# Patient Record
Sex: Male | Born: 1950 | Race: White | Hispanic: No | Marital: Married | Smoking: Former smoker
Health system: Southern US, Community
[De-identification: ages and names within clinical notes are randomized; demographics above are authoritative.]

## PROBLEM LIST (undated history)

## (undated) DIAGNOSIS — Z86010 Personal history of colonic polyps: Secondary | ICD-10-CM

## (undated) DIAGNOSIS — E785 Hyperlipidemia, unspecified: Secondary | ICD-10-CM

## (undated) DIAGNOSIS — J449 Chronic obstructive pulmonary disease, unspecified: Secondary | ICD-10-CM

## (undated) DIAGNOSIS — Z720 Tobacco use: Secondary | ICD-10-CM

## (undated) DIAGNOSIS — R918 Other nonspecific abnormal finding of lung field: Secondary | ICD-10-CM

## (undated) DIAGNOSIS — E119 Type 2 diabetes mellitus without complications: Secondary | ICD-10-CM

## (undated) DIAGNOSIS — J9811 Atelectasis: Secondary | ICD-10-CM

## (undated) DIAGNOSIS — I10 Essential (primary) hypertension: Secondary | ICD-10-CM

## (undated) DIAGNOSIS — G4730 Sleep apnea, unspecified: Secondary | ICD-10-CM

## (undated) DIAGNOSIS — Z136 Encounter for screening for cardiovascular disorders: Secondary | ICD-10-CM

## (undated) DIAGNOSIS — Z9889 Other specified postprocedural states: Secondary | ICD-10-CM

## (undated) DIAGNOSIS — K219 Gastro-esophageal reflux disease without esophagitis: Secondary | ICD-10-CM

## (undated) DIAGNOSIS — M1990 Unspecified osteoarthritis, unspecified site: Secondary | ICD-10-CM

## (undated) HISTORY — DX: Gastro-esophageal reflux disease without esophagitis: K21.9

## (undated) HISTORY — PX: FRACTURE SURGERY: SHX138

## (undated) HISTORY — DX: Hyperlipidemia, unspecified: E78.5

## (undated) HISTORY — DX: Other nonspecific abnormal finding of lung field: R91.8

## (undated) HISTORY — DX: Encounter for screening for cardiovascular disorders: Z13.6

## (undated) HISTORY — PX: LUNG SURGERY: SHX703

## (undated) HISTORY — DX: Personal history of colonic polyps: Z86.010

## (undated) HISTORY — DX: Atelectasis: J98.11

## (undated) HISTORY — PX: ROTATOR CUFF REPAIR: SHX139

## (undated) HISTORY — PX: TONSILLECTOMY: SUR1361

## (undated) HISTORY — DX: Chronic obstructive pulmonary disease, unspecified: J44.9

## (undated) HISTORY — DX: Tobacco use: Z72.0

## (undated) HISTORY — DX: Essential (primary) hypertension: I10

## (undated) HISTORY — DX: Other specified postprocedural states: Z98.890

---

## 1988-04-27 HISTORY — PX: NASAL SINUS SURGERY: SHX719

## 1998-04-26 ENCOUNTER — Encounter: Payer: Self-pay | Admitting: Emergency Medicine

## 1998-04-26 ENCOUNTER — Emergency Department (HOSPITAL_COMMUNITY): Admission: EM | Admit: 1998-04-26 | Discharge: 1998-04-26 | Payer: Self-pay | Admitting: Emergency Medicine

## 1998-08-29 ENCOUNTER — Ambulatory Visit (HOSPITAL_BASED_OUTPATIENT_CLINIC_OR_DEPARTMENT_OTHER): Admission: RE | Admit: 1998-08-29 | Discharge: 1998-08-29 | Payer: Self-pay | Admitting: Orthopedic Surgery

## 1998-11-13 ENCOUNTER — Ambulatory Visit (HOSPITAL_COMMUNITY): Admission: RE | Admit: 1998-11-13 | Discharge: 1998-11-13 | Payer: Self-pay | Admitting: Orthopedic Surgery

## 1998-11-13 ENCOUNTER — Encounter: Payer: Self-pay | Admitting: Orthopedic Surgery

## 1999-03-12 ENCOUNTER — Ambulatory Visit (HOSPITAL_COMMUNITY): Admission: RE | Admit: 1999-03-12 | Discharge: 1999-03-12 | Payer: Self-pay | Admitting: Orthopedic Surgery

## 1999-03-12 ENCOUNTER — Encounter: Payer: Self-pay | Admitting: Orthopedic Surgery

## 1999-06-17 ENCOUNTER — Encounter: Payer: Self-pay | Admitting: Orthopedic Surgery

## 1999-06-17 ENCOUNTER — Ambulatory Visit (HOSPITAL_COMMUNITY): Admission: RE | Admit: 1999-06-17 | Discharge: 1999-06-17 | Payer: Self-pay | Admitting: Orthopedic Surgery

## 2000-06-21 ENCOUNTER — Ambulatory Visit (HOSPITAL_COMMUNITY): Admission: RE | Admit: 2000-06-21 | Discharge: 2000-06-21 | Payer: Self-pay | Admitting: Orthopedic Surgery

## 2000-06-21 ENCOUNTER — Encounter: Payer: Self-pay | Admitting: Orthopedic Surgery

## 2000-07-06 ENCOUNTER — Ambulatory Visit (HOSPITAL_BASED_OUTPATIENT_CLINIC_OR_DEPARTMENT_OTHER): Admission: RE | Admit: 2000-07-06 | Discharge: 2000-07-06 | Payer: Self-pay | Admitting: Orthopedic Surgery

## 2000-07-06 HISTORY — PX: OTHER SURGICAL HISTORY: SHX169

## 2001-07-01 ENCOUNTER — Ambulatory Visit (HOSPITAL_BASED_OUTPATIENT_CLINIC_OR_DEPARTMENT_OTHER): Admission: RE | Admit: 2001-07-01 | Discharge: 2001-07-01 | Payer: Self-pay | Admitting: *Deleted

## 2001-08-04 ENCOUNTER — Encounter: Payer: Self-pay | Admitting: Gastroenterology

## 2001-08-04 ENCOUNTER — Ambulatory Visit (HOSPITAL_COMMUNITY): Admission: RE | Admit: 2001-08-04 | Discharge: 2001-08-04 | Payer: Self-pay | Admitting: Gastroenterology

## 2001-08-05 ENCOUNTER — Encounter: Payer: Self-pay | Admitting: Gastroenterology

## 2001-08-05 ENCOUNTER — Ambulatory Visit (HOSPITAL_COMMUNITY): Admission: RE | Admit: 2001-08-05 | Discharge: 2001-08-05 | Payer: Self-pay | Admitting: Gastroenterology

## 2001-08-08 ENCOUNTER — Encounter: Payer: Self-pay | Admitting: Gastroenterology

## 2001-08-08 ENCOUNTER — Ambulatory Visit (HOSPITAL_COMMUNITY): Admission: RE | Admit: 2001-08-08 | Discharge: 2001-08-08 | Payer: Self-pay | Admitting: Gastroenterology

## 2002-07-05 ENCOUNTER — Encounter: Payer: Self-pay | Admitting: Emergency Medicine

## 2002-07-05 ENCOUNTER — Inpatient Hospital Stay (HOSPITAL_COMMUNITY): Admission: EM | Admit: 2002-07-05 | Discharge: 2002-07-06 | Payer: Self-pay | Admitting: Emergency Medicine

## 2002-07-05 HISTORY — PX: CARDIAC CATHETERIZATION: SHX172

## 2002-07-06 ENCOUNTER — Encounter: Payer: Self-pay | Admitting: *Deleted

## 2005-11-11 ENCOUNTER — Ambulatory Visit (HOSPITAL_COMMUNITY): Admission: RE | Admit: 2005-11-11 | Discharge: 2005-11-12 | Payer: Self-pay | Admitting: Orthopedic Surgery

## 2005-11-11 HISTORY — PX: OTHER SURGICAL HISTORY: SHX169

## 2006-11-18 ENCOUNTER — Ambulatory Visit: Payer: Self-pay | Admitting: Pulmonary Disease

## 2006-11-22 ENCOUNTER — Ambulatory Visit: Admission: RE | Admit: 2006-11-22 | Discharge: 2006-11-22 | Payer: Self-pay | Admitting: Pulmonary Disease

## 2006-11-22 ENCOUNTER — Ambulatory Visit: Payer: Self-pay | Admitting: Pulmonary Disease

## 2006-11-22 ENCOUNTER — Encounter: Payer: Self-pay | Admitting: Pulmonary Disease

## 2006-11-22 HISTORY — PX: OTHER SURGICAL HISTORY: SHX169

## 2006-12-07 ENCOUNTER — Ambulatory Visit: Payer: Self-pay | Admitting: Pulmonary Disease

## 2007-01-10 ENCOUNTER — Ambulatory Visit: Payer: Self-pay | Admitting: Gastroenterology

## 2007-01-25 ENCOUNTER — Ambulatory Visit: Payer: Self-pay | Admitting: Pulmonary Disease

## 2007-07-12 DIAGNOSIS — J309 Allergic rhinitis, unspecified: Secondary | ICD-10-CM | POA: Insufficient documentation

## 2007-07-12 DIAGNOSIS — J45909 Unspecified asthma, uncomplicated: Secondary | ICD-10-CM | POA: Insufficient documentation

## 2007-07-12 DIAGNOSIS — I10 Essential (primary) hypertension: Secondary | ICD-10-CM | POA: Insufficient documentation

## 2007-07-12 DIAGNOSIS — K2980 Duodenitis without bleeding: Secondary | ICD-10-CM | POA: Insufficient documentation

## 2007-07-12 DIAGNOSIS — E7800 Pure hypercholesterolemia, unspecified: Secondary | ICD-10-CM | POA: Insufficient documentation

## 2008-06-22 HISTORY — PX: DOPPLER ECHOCARDIOGRAPHY: SHX263

## 2008-08-06 ENCOUNTER — Ambulatory Visit: Payer: Self-pay | Admitting: Pulmonary Disease

## 2008-08-06 DIAGNOSIS — F17200 Nicotine dependence, unspecified, uncomplicated: Secondary | ICD-10-CM

## 2008-08-08 DIAGNOSIS — G4733 Obstructive sleep apnea (adult) (pediatric): Secondary | ICD-10-CM | POA: Insufficient documentation

## 2008-09-04 ENCOUNTER — Ambulatory Visit: Payer: Self-pay | Admitting: Pulmonary Disease

## 2009-03-07 ENCOUNTER — Ambulatory Visit: Payer: Self-pay | Admitting: Gastroenterology

## 2009-03-07 DIAGNOSIS — K625 Hemorrhage of anus and rectum: Secondary | ICD-10-CM

## 2009-03-07 DIAGNOSIS — K589 Irritable bowel syndrome without diarrhea: Secondary | ICD-10-CM

## 2009-03-13 ENCOUNTER — Encounter (INDEPENDENT_AMBULATORY_CARE_PROVIDER_SITE_OTHER): Payer: Self-pay | Admitting: *Deleted

## 2009-03-14 ENCOUNTER — Ambulatory Visit: Payer: Self-pay | Admitting: Gastroenterology

## 2009-03-14 DIAGNOSIS — Z860100 Personal history of colon polyps, unspecified: Secondary | ICD-10-CM

## 2009-03-14 HISTORY — DX: Personal history of colon polyps, unspecified: Z86.0100

## 2009-03-14 HISTORY — DX: Personal history of colonic polyps: Z86.010

## 2009-03-27 ENCOUNTER — Encounter: Payer: Self-pay | Admitting: Gastroenterology

## 2010-05-14 ENCOUNTER — Encounter
Admission: RE | Admit: 2010-05-14 | Discharge: 2010-05-14 | Payer: Self-pay | Source: Home / Self Care | Attending: Family Medicine | Admitting: Family Medicine

## 2010-09-09 HISTORY — PX: OTHER SURGICAL HISTORY: SHX169

## 2010-09-09 NOTE — Assessment & Plan Note (Signed)
[facility]                             PULMONARY OFFICE NOTE   Vincent Short, Vincent Short                    MRN:          [id number]  DATE:11/18/2006                            DOB:          [birth year]    REFERRING PHYSICIAN:  Kerry Kass, M.D. [facility]   I met with Vincent Short for evaluation of his abnormal x-ray.   He says that approximately 2 weeks ago he was working in his yard, and  he believes that he had some exposure to some mold from hay next to his  yard.  He developed acute onset of shortness of breath with chest pain,  wheeze, cough, and nasal congestion.  He did not have any fevers or  chills.  He denies any abdominal pain or nausea. He has not had any  diarrhea.  There is no skin rash or leg swelling.  He says that his  symptoms have improved since then, although he has felt that he has some  congestion in his chest.  He does have a history of allergies and is on  allergy shots.  He says he is allergic to mold, grass, and pollen.  He  has been feeling short of breath since this episode, but this appears to  be improving somewhat.  He had a chest x-ray done recently which showed  atelectasis in the right upper lobe which is confirmed with CT scan of  the chest.  It appears that he had soft tissue narrowing of the right  upper lobe bronchus but no definite mass, and there was no significant  lymphadenopathy.   PAST MEDICAL HISTORY:  Significant for:  1. Hypertension.  2. Elevated cholesterol.  3. Allergies.  4. Sleep apnea.   PAST SURGICAL HISTORY:  Significant for:  1. Tonsillectomy.  2. Left wrist and left ankle fractures.  3. Sinus surgery in 1990.   CURRENT MEDICATIONS:  1. Metoprolol 25 mg daily.  2. Avapro 300 mg daily.  3. Caduet 10 mg daily.  4. Astelin as needed.  5. Nasacort as needed.  6. Zyrtec as needed.   ALLERGIES:  IV DYE.   SOCIAL HISTORY:  He is married.  He works in Psychologist, sport and exercise.  He  smoked 1 pack of cigarettes a day for the last 35  years.  He has occasional alcohol use.   FAMILY HISTORY:  Significant for his parents who have allergies and  heart disease.   REVIEW OF SYSTEMS:  Unremarkable except as stated above.   PHYSICAL EXAMINATION:  VITAL SIGNS:  He is 6 feet tall, 225 pounds.  Temperature is 98.4, blood pressure 110/78, heart rate 75, oxygen  saturation 94% on room air.  HEENT:  Pupils reactive, extraocular movements intact.  There is no  sinus tenderness, no nasal discharge.  NECK:  He has Mallampati III airway.  There is no lymphadenopathy, no  thyromegaly.  HEART:  S1, S2.  Regular rate and rhythm.  CHEST:  There were no wheezing or rales, but there were diminished  breath sounds.  ABDOMEN:  Obese, soft, nontender.  Positive bowel sounds.  EXTREMITIES:  No edema, cyanosis, clubbing.Marland Kitchen  NEUROLOGIC:  No focal deficits were appreciated.   Chest x-ray in my office today showed right upper lobe opacity with  atelectasis.   IMPRESSION:  1. Right upper lobe atelectasis with history of allergies and possibly      asthma.  He does also have a history of significant tobacco abuse.      To further assess this lesion,  I have scheduled him for a      bronchoscopy which will be done on July 28 at [facility]      at 11:30 a.m.  I have discussed the procedure with him.  I reviewed      the possible risks including bleeding, infection, pneumothorax, and      no diagnosis.  I have advised the patient that he should not eat or      drink anything after midnight prior to the study and that he would      need to have somebody who can drive him to and from the hospital.  2. Tobacco abuse.  I have prescribed him Chantix.  I discussed with      him the dosing regimen for this as well as the scheduled quit date      for his cigarettes and also advised him of the possible adverse      side effects related to Chantix.   I will follow up with him in approximately  1 week to discuss the results  of his biopsy and also to assess his smoking status.     Coralyn Helling, MD  Electronically Signed    VS/MedQ  DD: 11/23/2006  DT: 11/24/2006  Job #: [id number]   cc:   Kerry Kass, M.D. [facility]

## 2010-09-09 NOTE — Assessment & Plan Note (Signed)
[facility]                         GASTROENTEROLOGY OFFICE NOTE   Vincent Short, Vincent Short                    MRN:          [id number]  DATE:01/10/2007                            DOB:          12-27-1950    REASON FOR CONSULTATION:  Change in bowel habits and rectal bleeding.   Vincent Short is a pleasant 60 year old white male here for evaluation of  erratic bowels.  He has had several weeks of frequent loose stools.  It  is accompanied by urgency and severe crampy lower abdominal pain.  He  has had several episodes of bright red blood mixed with his stools.  The  symptoms are coincidental with him starting Chantix for smoking  cessation.  He has a history of what sounds like IBS with probably  sensitive stomach where he will develop loose stools.  There has been no  change in his food intake recently.  Last colonoscopy was in 2003, which  was normal.   PAST MEDICAL HISTORY:  1. Hypertension.  2. He has sleep apnea.  3. He has undergone ankle, wrist, and sinus surgery.  4. Tonsillectomy.   FAMILY HISTORY:  Noncontributory.   MEDICATIONS:  Metoprolol, Avapro, Caduet, Astelin, Nasacort, and  nicotine patch.   ALLERGIES:  IVP DYE.   He is a recent ex-smoker.  He does not drink.  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   REVIEW OF SYSTEMS:  Positive for joint pains and fatigue.   PHYSICAL EXAMINATION:  VITAL SIGNS:  Pulse 92, blood pressure 136/70,  weight 227.   PHYSICAL EXAMINATION:  HEENT: EOMI.  PERRLA.  Sclerae are anicteric.  Conjunctivae are pink.  NECK:  Supple without thyromegaly, adenopathy or carotid bruits.  CHEST:  Clear to auscultation and percussion without adventitious  sounds.  CARDIAC:  Regular rhythm; normal S1 S2.  There are no murmurs, gallops  or rubs.  ABDOMEN:  Bowel sounds are normoactive.  Abdomen is soft, nontender and  nondistended.  There are no abdominal masses, tenderness, splenic  enlargement or  hepatomegaly.  EXTREMITIES:  Full range of motion.  No cyanosis, clubbing or edema.  RECTAL:  Deferred.   IMPRESSION:  1. Change in bowel habits characterized by abdominal pain and      diarrhea.  This certainly could be related to his Chantix.      Obstructive lesion of the colon ought to be ruled out.  2. Rectal bleeding, probably secondary to number one.   RECOMMENDATION:  1. Hold Chantix.  2. Colonoscopy.     Barbette Hair. Arlyce Dice, MD,FACG  Electronically Signed    RDK/MedQ  DD: 01/10/2007  DT: 01/10/2007  Job #: [id number]

## 2010-09-09 NOTE — Assessment & Plan Note (Signed)
[facility]                             PULMONARY OFFICE NOTE   NAME:CHRISMONNeftali, Short                    MRN:          [id number]  DATE:12/07/2006                            DOB:          28-Aug-1950    I saw Mr. Vincent Short with his wife today in followup for his right upper  lobe atelectasis and tobacco abuse.   He says that his symptoms have improved considerably since his last  visit.  He also says that he will start taking his Chantix August 13,  although he still continues to smoke about a pack of cigarettes a day.   His medication list was reviewed.   PHYSICAL EXAMINATION:  He is 227 pounds, temperature is 97.5, blood  pressure is 118/78, heart rate is 77, oxygen saturation is 95% on room  air.  HEENT:  No sinus tenderness, no oral lesions, no oral lymphadenopathy.  HEART:  S1, S2.  CHEST:  There was no wheezing or rales.  ABDOMEN:  Soft, nontender.  EXTREMITIES:  No edema.   Chest x-ray done earlier today showed complete resolution of the right  upper lobe atelectasis with no other acute findings.   IMPRESSION:  1. Right upper lobe atelectasis which has essentially resolved, and I      suspect this was on the basis of possible underlying asthma with      acute bronchitic flare.  For this I will give him a trial of      Proventil HFA inhaler 2 puffs q.i.d. p.r.n., although I suspect      that much of his symptoms with this will likely improve      significantly once he discontinues smoking.  2. Tobacco abuse.  I have encouraged him to initiate his program of      smoking cessation with Chantix.  3. Allergic rhinitis.  He is due to follow with Dr. Corinda Gubler.   I will follow up with him in approximately 6 weeks.     Coralyn Helling, MD  Electronically Signed    VS/MedQ  DD: 12/07/2006  DT: 12/08/2006  Job #: [id number]   cc:   Kerry Kass, M.D. [facility]

## 2010-09-09 NOTE — Assessment & Plan Note (Signed)
HEALTHCARE                             PULMONARY OFFICE NOTE   NAME:CHRISMONTyan, Dy                    MRN:          [id number]  DATE:01/25/2007                            DOB:          03/19/1951    SUBJECTIVE:  I saw Mr. Duvall today in followup for his atelectasis,  asthma, tobacco abuse and allergic rhinitis.   He says that about one or two weeks after being started on Chantix, he  started developing bloating and diarrhea.  He has since been seen by Dr.  Barbette Hair. Arlyce Dice and is scheduled to undergo a colonoscopy.  He said  that after stopping the Chantix, his symptoms have improved, although he  is still having some problems.  He continues to smoke four or five  cigarettes a day.  He says that his breathing is better, but he still  occasionally needs to use his Proventil inhaler.   CURRENT MEDICATIONS:  1. Metoprolol 25 mg daily.  2. Avapro 300 mg daily.  3. Astelin two sprays twice daily.  4. Nasacort as needed.  5. Caduet 10/40 mg daily.  6. He said he also tried a Nicotine patch, but did not notice any      difference with this.   PHYSICAL EXAMINATION:  VITAL SIGNS:  Weight 231 pounds, temperature 97  degrees, blood pressure 120/82, heart rate 76, oxygen saturation 96% on  room air.  HEENT:  No sinusitis, no lesions.  HEART:  S1 and S2.  CHEST:  No wheezing.  ABDOMEN:  Obese, soft, nontender.  EXTREMITIES:  No edema.   IMPRESSION/PLAN:  1. Abnormal chest x-ray with atelectasis:  I will have him undergo a      repeat chest x-ray in my office today, to make sure that this is      cleared up completely.  2. Tobacco abuse:  He is to have his colonoscopy with Dr. Melvia Heaps.  Depending upon the results of this, it may be beneficial      to try him on Chantix again, since he did seem to have a good      response with regards to his smoking cessation, but this will      depend upon whether he will be able to tolerate it, due  to his      possible gastrointestinal side effects.  3. Asthma and allergic rhinitis:  He is to follow up with Dr. Kerry Kass for this.   I will call him with the results of his chest x-ray; however, if this is  unremarkable, I have advised him that he could follow up with Dr. Stevphen Rochester for further treatment of his asthma and allergies.  In addition,  I have advised him to make arrangements to establish care with a primary  care physician who will then be able to continue treatment of his  tobacco use.  Again, if his  chest x-ray is unremarkable, then I have advised him that he can follow  up with me on an as-needed basis.  Coralyn Helling, MD  Electronically Signed    VS/MedQ  DD: 01/25/2007  DT: 01/25/2007  Job #: [id number]   cc:   Kerry Kass, M.D. [facility]

## 2010-09-09 NOTE — Op Note (Signed)
NAMEDEVONN, Vincent Short             ACCOUNT NO.:  [id number]   MEDICAL RECORD NO.:  [id number]          PATIENT TYPE:  AMB   LOCATION:  CARD                         FACILITY:  [facility]   PHYSICIAN:  Coralyn Helling, MD        DATE OF BIRTH:  1950/09/16   DATE OF PROCEDURE:  11/22/2006  DATE OF DISCHARGE:                               OPERATIVE REPORT   PROCEDURE:  Bronchoscopy with airway inspection, brush cytology,  transbronchial biopsy and fine-needle aspiration.   PREOPERATIVE DIAGNOSES:  1. Atelectasis.  2. Rule out lung cancer.   POSTOPERATIVE DIAGNOSES:  1. Atelectasis.  2. Rule out lung cancer.   ATTENDING PHYSICIAN:  Coralyn Helling, MD   PROCEDURE:  Vincent Short is a 60 year old male who has a history of  allergies and tobacco use.  He developed symptoms of an upper airway  infection and chest x-ray at that time showed atelectasis of the right  upper lobe; this was confirmed on CT scan of his chest and he is  scheduled to undergo bronchoscopy for airway inspection to determine if  there is an obstructing lesion in the right upper lobe.  The procedure  was explained to the patient.  Risks were detailed including possible  infection, bleeding, pneumothorax and non-diagnosis.  The patient signed  the consent form.  He was given a total of 200 mcg of fentanyl and 10 mg  of Versed for sedation and analgesia.  His right naris was sprayed with  viscous lidocaine and his posterior pharynx was sprayed with Cetacaine  spray.  The bronchoscope was entered to the right naris.  The vocal  cords were visualized and appeared to have normal motion.  Lidocaine was  instilled as needed for topical anesthesia.  The bronchoscope was then  entered into the trachea.  The carina was visualized and appeared  normal.  The bronchoscope was then entered to the left main bronchus.  The left upper lingular and lower lobe orifices were all visualized and  there were no obvious endobronchial lesions.  The  bronchoscope was then  entered to the right main bronchus.  The right middle and lower lobe  orifices were visualized without any obvious endobronchial lesions.  The  focus was then shifted to the right upper lobe.  There was a  considerable amount of thick yellowish secretions obstructing the right  upper lobe orifice.  Saline and Mucomyst were instilled to loosen the  secretions up, which were vigorously suctioned and the majority of the  secretions were removed.  The airways appeared to be more patent after  this, although there still appeared to be some degree of inflammation  and narrowing of the right upper lobe orifice.  Then using fluoroscopic  guidance, transbronchial biopsies were obtained from the right upper  lobe in all 3 segments.  Following this, brush cytology was obtained  from the right upper lobe.  Then following this, fine-needle aspiration  was done from the right upper lobe.  There was a minimal amount of  bleeding which resolved after instillation of saline.  There was no  other obvious immediate complication and  the patient was returned to  recovery room in stable condition.  The specimens will be sent for  cytology and surgical pathology and microbiology.  A chest x-ray is  pending at this time.  I will follow up the patient in the next 2-3 days  to discuss the results of his biopsy as well as to determine if any  further interventions will be necessary.      Coralyn Helling, MD  Electronically Signed     VS/MEDQ  D:  11/22/2006  T:  11/23/2006  Job:  [id number]

## 2010-09-12 NOTE — Discharge Summary (Signed)
NAME:  Vincent Short, Vincent Short                       ACCOUNT NO.:  [id number]   MEDICAL RECORD NO.:  [id number]                   PATIENT TYPE:  INP   LOCATION:  3737                                 FACILITY:  [facility]   PHYSICIAN:  Kem Boroughs, M.D.                 DATE OF BIRTH:  [birth year]   DATE OF ADMISSION:  07/05/2002  DATE OF DISCHARGE:  07/06/2002                                 DISCHARGE SUMMARY   DISCHARGE DIAGNOSES:  1. Chest pain consistent with unstable angina.  2. Smoking.  3. Hyperlipidemia.  4. Hypertension.  5. Family history of coronary disease.   HOSPITAL COURSE:  The patient is a 60 year old male followed at [facility], with no prior history of coronary disease.  He presented to  the emergency room with complaints of chest pressure and dyspnea on  exertion, consistent with unstable angina.  At the time he had his symptoms,  he was working with a chain saw and had to stop because of severe dyspnea  and left chest pain.  He was pain-free by the time he arrived to the  emergency room.  He admitted to dyspnea on exertion over the last year or  so.  He has multiple risk factors for coronary disease as noted above  including hypertension, hyperlipidemia and smoking and family history.  The  patient was admitted to telemetry.  CK, MB's and troponins were obtained.  These were negative.  His EKG was normal.  We proceeded with elective  catheterization which was done 07/05/02.  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  Otherwise, he had no significant stenosis.  He had normal LV  function, normal renal arteries and normal iliac arteries.  He tolerated  procedure well.  Plan is for smoking cessation, aggressive lipid management  and control of his hypertension.   LABORATORY DATA:  Sodium 140, potassium 4.4, BUN 7, creatinine 0.8.  LFT's  are normal.  White count 11, hemoglobin 16,  hematocrit 46, platelets 213.  INR 0.9, CK-MB and troponins negative.  Chest x-ray is pending at the time  of the dictation.  EKG shows sinus rhythm without acute changes.   DISCHARGE MEDICATIONS:  1. Toprol XL 25 mg a day.  2. Avapro 150 daily.  3.     Aspirin 81 mg a day.  4. Wellbutrin SR 150 1 daily for three days and then 1 p.o. b.i.d.   DISPOSITION:  The patient is discharged in stable condition.  He will  followup with Dr. Domingo Sep in [place].      Abelino Derrick, P.A.                      Kem Boroughs, M.D.    Lenard Lance  D:  07/06/2002  T:  07/06/2002  Job:  [id number]   cc:   Kem Boroughs,  M.D.  [address], [employer]  [place]  [place] [address]  Fax: [phone]   Olena Leatherwood [facility]

## 2010-09-12 NOTE — Discharge Summary (Signed)
   NAME:  Short, Vincent                       ACCOUNT NO.:  [id number]   MEDICAL RECORD NO.:  [id number]                   PATIENT TYPE:  INP   LOCATION:  3737                                 FACILITY:  [facility]   PHYSICIAN:  Darlin Priestly, M.D.             DATE OF BIRTH:  10-Jun-1950   DATE OF ADMISSION:  07/05/2002  DATE OF DISCHARGE:                                 DISCHARGE SUMMARY   ADDENDUM:  The patient was seen and evaluated by Dr. Pearletha Furl. Alanda Amass on  July 06, 2002.  It was felt that the elevated white count may be secondary  to some mild sinusitis.  The patient does show some symptoms of sinusitis.  It was decided to add Nasacort, but as it turns out, the patient already has  Flonase at home, so we will continue Flonase.  As well, we were going to add  Zyrtec but he has a Allegra at home and is going to continue Allegra.  In  addition, we have given him a Z-Pak to take 500 mg today, then 250 mg once a  day for five days.  As well, we have discussed smoking cessation.  As well,  the chest x-ray has been reviewed and showed some mild cardiomegaly and mild  atelectasis.  No other significant findings.   Also please note that the patient says that his parents see Dr. Darlin Priestly and highly recommend Dr. Jenne Campus.  Because of this, he wants to  follow up with Dr. Jenne Campus in [place] rather than going to Dr. Domingo Sep  in [place].  Therefore, addendums have been made to his medicine list as  above.  As well, I have scheduled him an appointment to see Dr. Jenne Campus,  April 6th, at 3:45 p.m.     Mary B. Easley, P.A.-C.                   Darlin Priestly, M.D.    MBE/MEDQ  D:  07/06/2002  T:  07/07/2002  Job:  [id number]

## 2010-09-12 NOTE — Op Note (Signed)
NAMETORRION, WITTER             ACCOUNT NO.:  [id number]   MEDICAL RECORD NO.:  [id number]          PATIENT TYPE:  OIB   LOCATION:  2550                         FACILITY:  [facility]   PHYSICIAN:  Robert A. Thurston Hole, M.D. DATE OF BIRTH:  [birth year]   DATE OF PROCEDURE:  11/11/2005  DATE OF DISCHARGE:                                 OPERATIVE REPORT   PREOPERATIVE DIAGNOSES:  1. Left ankle ligamentous instability.  2. Left ankle loose bodies with synovitis.  3. Left ankle chondromalacia.   POSTOPERATIVE DIAGNOSES:  1. Left ankle ligamentous instability.  2. Left ankle loose bodies with synovitis.  3. Left ankle chondromalacia.   PROCEDURES:  1. Left ankle examination under anesthesia, followed by arthroscopic      debridement with loose body excision and synovectomy and chondroplasty.  2. Left ankle modified Brostrom ligamentous reconstruction.   SURGEON:  Elana Alm. Thurston Hole, M.D.   ASSISTANT:  Julien Girt, P.A.   ANESTHESIA:  General.   OPERATIVE TIME:  One hour and 20 minutes.   COMPLICATIONS:  None.   INDICATIONS:  Vincent Short is a 60 year old gentleman who injured his left  ankle on Sep 06, 2005, with an inversion injury.  Significant pain with  significant instability noted on x-rays including stress x-rays with a large  degree of instability of the ankle.  Due to this and loose bodies noted on  the x-rays he is now to undergo arthroscopy, debridement and ligament  reconstruction.   DESCRIPTION OF PROCEDURE:  Vincent Short was brought to the operating room on  October 12, 2005, placed on the operating table in the supine position after a  popliteal nerve block was placed in the holding room by anesthesia.  After  being placed under MAC anesthesia, his left ankle was examined.  He had full  range of motion with 2+ anterior drawer and 3+ talar tilt.  His left foot  and leg were prepped and draped using sterile DuraPrep and draped using  sterile technique.  He received  Ancef 1 g IV perioperatvely for prophylaxis.  Foot and leg was exsanguinated and a calf tourniquet elevated to 300.  Initially the arthroscopy was performed through an anteromedial portal  incising only skin to prevent any damage to the dorsal cutaneous nerves.  The arthroscope with a pump attached was placed and through an anterolateral  portal using the same careful technique, arthroscopic probe was placed.  On  initial inspection the articular cartilage on the talar dome and tibial  plafond centrally was found to be intact.  There was significant synovitis  laterally and this was debrided.  The lateral gutter showed significant  synovitis and some loose bodies in this area and this was debrided.  The  lateral malleolus and lateral talar dome articular cartilage showed grade 1  and 2 chondromalacia.  Medially significant synovitis was debrided. The  medial gutter showed synovitis as well which was debrided.  There was found  to be 30% grade 4 and the rest grade 3 chondromalacia on the medial  malleolus and medial talar dome articulation.  This was debrided.  There was  also  found to be a spur medially and this was resected with a 4 mm bur.  After this was done, no further pathology noted from the arthroscopic point  of view but the ligament reconstruction was then carried out.  The  arthroscopic instruments were removed.  A 5 to 6 cm posterolateral incision  was then made over the distal fibula, underlying subcutaneous tissues were  incised along the skin incision.  Sural nerve carefully protected while the  an anterior lateral oblique incision was made over the anterior talofibular  ligament capsular complex.  Found to have significant laxity and redundancy  and tearing of this tissue and all of the torn tissue was debrided back to  more healthy appearing tissue.  Three separate mattress 2-0 FiberWire  sutures were placed in the ATF capsular complex to be tied later.  At this  point the  peroneus brevis was exposed proximal to the fibula through the  peroneal tendon sheath.  The peroneus brevis was split longitudinally and  harvested down distally to just proximal to the base of the 5th metatarsal  keeping it still attached there.  One-half of this peroneus brevis was  passed through the anterolateral soft tissues and then a drill hole placed  in the distal fibula from anterior to posterior and then the tendon passed  through this fibular tunnel from anterior to posterior and then back  subperiosteally on itself.  The ankle was then placed in a neutral and  slight eversion position and each of the ATF capsular sutures were  sequentially tied.  This significantly reduced the instability by about 80%.  With the ankle remaining in this position, then the peroneus brevis graft  was tied down to itself over the bone tunnel using multiple 2-0 FiberWire  sutures as well.  After this was done the instability was found to be  completely eliminated.  At this point the wound was copiously irrigated.  The fascia over the ATF capsular sutures were closed with 0 Vicryl.  The  peroneal tendon sheath was closed with 2-0 Vicryl.  Subcutaneous tissues  closed with 2-0 Vicryl, skin closed with skin staples.  Sterile dressings  were applied and a short leg splint applied.  The tourniquet was released,  and then the patient awakened and taken to the recovery room in stable  condition.  Needle and sponge counts were correct times 2 at the end of the  case.   FOLLOWUP:  Vincent Short will be followed overnight for close observation due  to his significant sleep apnea.  He will be discharged tomorrow if stable.  He will be seen back in the office in a week for wound check and followup.      Robert A. Thurston Hole, M.D.  Electronically Signed     RAW/MEDQ  D:  11/11/2005  T:  11/12/2005  Job:  [id number]   cc:   Worker's comp carrier

## 2010-09-12 NOTE — Op Note (Signed)
[place]  Patient:    Vincent Short, Vincent Short                    MRN: [id number] Proc. Date: 07/06/00 Adm. 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  OPERATION:  Arthroscopy left wrist with radial styloidectomy.  SURGEON:  Nicki Reaper, M.D.  ASSISTANT:  Joaquin Courts, R.N.  ANESTHESIA:  Axillary block.  ANESTHESIOLOGIST:  Janetta Hora. Gelene Mink, M.D.  HISTORY:  The patient is a 60 year old male with a history of a scaphoid nonunion.  He has an avascular proximal pole, has undergone a vascularized bone graft, has questionable healing.  He has early changes for arthritis on the radial styloid.  DESCRIPTION OF PROCEDURE:  The patient was brought to the operating room, where an axillary block was carried out without difficulty.  He was prepped and draped using Betadine scrub and solution with the left arm free.  The limb was placed in the arthroscopy tower and 10 pounds of traction applied.  The joint inflated 3-4 portal.  A six U portal was used for outflow.  The scope was introduced after making a small transverse incision, deepened with a hemostat.  A blunt trocar was used to enter the joint.  The joint was inspected.  The cartilage was intact over the proximal aspect of the scaphoid and the area of the fracture was apparent.  There was no gross movement to the area.  Area of chondromalacia was present on the radial styloid tip.  The ulnar side of the ligaments were intact.  Triangular fibrocartilage was intact.  Four-5 portal was opened along with a two portal.  The scope was then introduced in 4-5, brought to the ulnar aspect.  A shaver was then used to delineate the radial styloid taking care to protect  the volar radial wrist ligaments.  The mark was then made across the radius at the line of the chondromalacia.  This area was then debrided and a bur was then used for removal of bone fully decompressing the area.  X-rays confirmed the compression of the very tip of the styloid only.  The mid carpal joint was then inspected through the distal 3-4 portal. The area of nonunion to the proximal graft site was apparent.   There was no collapse with good articular surface and cartilage to all poles of the scaphoid.  There was a mild amount of changes in the proximal hamate.  No other changes were visualized or appreciated.  The wounds were copiously irrigated with saline.  The portals were then closed with interrupted 5-0 nylon sutures.  Sterile compressive dressing and splint to the wrist was applied.  The patient tolerated the procedure well and was taken to the recovery room for observation in satisfactory condition.  He was discharged home to return to the [facility] [place] in one week on Vicodin and Keflex. DD:  07/06/00 TD:  07/06/00 Job: [id number]

## 2010-09-12 NOTE — Cardiovascular Report (Signed)
NAME:  Vincent Short, Vincent Short NO.:  [id number]   MEDICAL RECORD NO.:  [id number]                   PATIENT TYPE:  INP   LOCATION:  1823                                 FACILITY:  [facility]   PHYSICIAN:  Nicki Guadalajara, M.D.                  DATE OF BIRTH:  [birth year]   DATE OF PROCEDURE:  07/05/2002  DATE OF DISCHARGE:                              CARDIAC CATHETERIZATION   INDICATIONS FOR PROCEDURE:  The patient is a 60 year old white male with a  history of longstanding tobacco use, significant hyperlipidemia, as well as  hypertension, and a family history of coronary artery disease.  He presented  to [facility] today with left-sided chest pressure radiating down  his left arm, suggestive of possible unstable angina.  In light of his  significant cardiac risk factor profile, he was referred for definitive  diagnostic cardiac catheterization.   PROCEDURE:  Left heart catheterization, cine coronary angiography, selective  injection of 200 mcg intracoronary nitroglycerin into the right coronary  artery, biplane cine left ventriculography, distal aortography.   PROCEDURE:  After premedication with Versed 2 mg intravenously, the patient  was prepped and draped in the usual fashion.  His right femoral artery was  punctured anteriorly, and a 6-French sheath was inserted without difficulty.  Diagnostic catheterization was done with 6-French Judkins 4 left and right  coronary catheters.  With the demonstration of mild ostial proximal  narrowing of the right coronary artery, 200 mcg of intracoronary  nitroglycerin was selectively injected down the right coronary artery.  Repeat angiography was performed.  A 6-French pigtail catheter was used for  biplane cine left ventriculography as well as distal aortography.  Hemostasis was obtained by direct manual pressure.  The patient tolerated  the procedure well.   HEMODYNAMIC DATA:  1. Central aortic pressure was  122/65, mean 95.  2. Left ventricular pressure 122/8, post A wave 15.   ANGIOGRAPHIC DATA:  1. The left main was angiographically normal and bifurcated into the left     anterior descending artery and left circumflex coronary artery.  2. There was smooth ostial 10% to 20% narrowing in the LAD.  The proximal     LAD had luminal irregularities with narrowings of approximately 10%.  The     LAD gave rise to 2 prominent diagonal vessels and several septal     perforating arteries.  3. The left circumflex coronary artery gave rise to 2 marginal vessels and     was angiographically normal.  4. The right coronary artery had proximal to ostial smooth narrowing of 30%     to 40%.  Following 200 mcg of intracoronary nitroglycerin, there was     significant improvement with almost complete normalization of the smooth     proximal narrowing.  The remainder of the RCA was angiographically     normal.  5. Biplane cine left ventriculography revealed normal LV  function.  There     were no focal segmental wall motion abnormalities.  6. Distal aortography did not demonstrate any significant aortoiliac     disease.  Specifically, there was no evidence for renal artery stenosis     in this patient with a significant hypertensive history.   IMPRESSION:  1. Normal left ventricular function.  2. No significant coronary obstructive disease but with evidence for smooth     10% to 20% ostial and 10% to 20% luminal irregularity of the proximal     left anterior descending artery, smooth 30% to 40% proximal narrowing of     the right coronary artery which improved following intracoronary     nitroglycerin administration suggesting a component of coronary     vasospasm.  3. No significant aortoiliac disease.                                               Nicki Guadalajara, M.D.    TK/MEDQ  D:  07/05/2002  T:  07/05/2002  Job:  [id number]   cc:   Cardiac Catheterization Lab   [facility], Office   Kem Boroughs, M.D.  [address] [place]  [place]  [place] [address]  Fax: [phone]   [facility]

## 2010-12-23 ENCOUNTER — Other Ambulatory Visit: Payer: Self-pay | Admitting: Family Medicine

## 2010-12-23 DIAGNOSIS — R918 Other nonspecific abnormal finding of lung field: Secondary | ICD-10-CM

## 2010-12-24 ENCOUNTER — Ambulatory Visit
Admission: RE | Admit: 2010-12-24 | Discharge: 2010-12-24 | Disposition: A | Discharge status: Home / Self Care | Payer: BC Managed Care – PPO | Source: Ambulatory Visit | Attending: Family Medicine | Admitting: Family Medicine

## 2010-12-24 DIAGNOSIS — R918 Other nonspecific abnormal finding of lung field: Secondary | ICD-10-CM

## 2010-12-24 MED ORDER — IOHEXOL 300 MG/ML  SOLN
75.0000 mL | Freq: Once | INTRAMUSCULAR | Status: AC | PRN
  Administered 2010-12-24: 75 mL via INTRAVENOUS

## 2010-12-25 DIAGNOSIS — I10 Essential (primary) hypertension: Secondary | ICD-10-CM

## 2010-12-25 DIAGNOSIS — R918 Other nonspecific abnormal finding of lung field: Secondary | ICD-10-CM

## 2010-12-25 DIAGNOSIS — J9811 Atelectasis: Secondary | ICD-10-CM | POA: Insufficient documentation

## 2010-12-25 DIAGNOSIS — Z720 Tobacco use: Secondary | ICD-10-CM

## 2010-12-25 DIAGNOSIS — E785 Hyperlipidemia, unspecified: Secondary | ICD-10-CM

## 2010-12-25 DIAGNOSIS — J449 Chronic obstructive pulmonary disease, unspecified: Secondary | ICD-10-CM | POA: Insufficient documentation

## 2010-12-30 ENCOUNTER — Encounter: Payer: BC Managed Care – PPO | Admitting: Surgery

## 2011-01-02 ENCOUNTER — Institutional Professional Consult (permissible substitution) (INDEPENDENT_AMBULATORY_CARE_PROVIDER_SITE_OTHER): Payer: BC Managed Care – PPO | Admitting: Surgery

## 2011-01-02 ENCOUNTER — Encounter: Payer: Self-pay | Admitting: Surgery

## 2011-01-02 VITALS — BP 107/70 | HR 72 | Resp 16 | Ht 72.0 in | Wt 228.0 lb

## 2011-01-02 DIAGNOSIS — R918 Other nonspecific abnormal finding of lung field: Secondary | ICD-10-CM

## 2011-01-02 DIAGNOSIS — R222 Localized swelling, mass and lump, trunk: Secondary | ICD-10-CM

## 2011-01-02 NOTE — Progress Notes (Signed)
PCP is Leo Grosser, MD, MD Referring Provider is Pickard, Ellan Lambert, MD  No chief complaint on file.   HPI: Is a 60 year old gentleman referred by Dr. Rayne Du for evaluation of a right lung apical mass. He was on vacation in [place] several weeks ago and developed the symptoms of cough, wheezing, and dyspnea. He thought that he probably had bronchitis which he has had in the past. He was seen at a local hospital in [place] and given a 4 day course of prednisone and clarithromycin. He was also told that he had a shadow on his chest x-ray which required further workup when he got home. His symptoms did not improve and he presented to Dr. Tanya Nones. The chest x-ray report from the hospital in [place] showed increased density in the retrosternal space suggesting a possible mediastinal mass. He underwent CT scan of the chest on 12/24/2010 with the results as noted below.  Past Medical History  Diagnosis Date  . HTN (hypertension)   . Hyperlipidemia   . Tobacco abuse   . COPD (chronic obstructive pulmonary disease)   . Lung mass     noted on CT scan(12/24/10)   . Allergic rhinitis   . Asthma   . Atelectasis     Rt upper lobe    Past Surgical History  Procedure Date  . Bronchoscopy with airway inspection, brush cytology, 11/22/2006    Dr Craige Cotta  . Left ankle surgery 11/11/2005    Dr Thurston Hole  . Arthroscopy left wrist with radial styloidectomy. 07/06/2000    Dr Merlyn Lot  . Cardiac catheterization 07/05/2002    Dr Tresa Endo  . Tonsillectomy   . Nasal sinus surgery 1990    Family History  Problem Relation Age of Onset  . Coronary artery disease Father   . Coronary artery disease Mother   . Allergic rhinitis Father   . Allergic rhinitis Mother     Social History History  Substance Use Topics  . Smoking status: Current Everyday Smoker    Types: Cigarettes  . Smokeless tobacco: Not on file  . Alcohol Use: Yes    Current Outpatient Prescriptions  Medication Sig Dispense  Refill  . amLODipine (NORVASC) 10 MG tablet Take 10 mg by mouth daily.        Marland Kitchen atorvastatin (LIPITOR) 40 MG tablet Take 40 mg by mouth daily.        . budesonide-formoterol (SYMBICORT) 160-4.5 MCG/ACT inhaler Inhale 2 puffs into the lungs 2 (two) times daily.        Marland Kitchen losartan (COZAAR) 100 MG tablet Take 100 mg by mouth daily.        . nebivolol (BYSTOLIC) 10 MG tablet Take 10 mg by mouth daily.        . niacin (NIASPAN) 500 MG CR tablet Take 500 mg by mouth at bedtime.          Allergies  Allergen Reactions  . Contrast Media (Iodinated Diagnostic Agents) Swelling    Allergic reaction 1987, ok w/ 13 hr prep//a.c.    Review of Systems: Gen-  He reports some fevers but this had resolved. He has had no chills. His appetite has been normal and he has had no weight loss. He does report some fatigue.  Eyes-  No visual changes  Ears- negative  Throat- no soreness  Endocrine-  he denies diabetes and hypothyroidism  Cardiac-  he denies any chest pain or pressure. He denies exertional dyspnea. He denies PND and orthopnea. He denies peripheral edema and palpitations.  Lungs-  he denies cough and sputum production. He has had no hemoptysis.  GI-  he denies nausea and vomiting. He has had no dysphagia. He has had no melena or bright red blood per rectum.  GU-  he denies dysuria and hematuria.  Musculoskeletal-  he denies arthralgias and myalgias.  Neuro-  he denies any dizziness or syncope. He has had no headaches or visual changes. He denies any focal weakness or numbness.  Heme-  he denies any bleeding disorders or easy bleeding.  Psychiatric-  negative  Physical Exam: BP 107/70  Pulse 72  Resp 16  Ht 6' (1.829 m)  Wt 228 lb (103.42 kg)  BMI 30.92 kg/m2  SpO2 95%  He is a well-developed white male in no distress. HEENT-  normocephalic and atraumatic. Pupils are equal and reactive to light and accommodation. Extraocular muscles are intact. Neck-  carotid pulses are palpable  bilaterally. There are no bruits. There is no cervical or supraclavicular adenopathy. Lungs-  his lungs are clear. Heart- regular rate and rhythm with normal S1 and S2. There is no murmur, rub or gallop. Chest- there is no chest wall abnormality. Abdomen- bowel sounds are present. His abdomen is soft and nontender. There are no palpable masses or organomegaly. Extremites- there is no peripheral edema. Pedal pulses are palpable bilaterally. Skin is warm and dry. Neuro-  he is alert and oriented x3. Motor and sensory exams are grossly normal.  Diagnostic Tests:   *RADIOLOGY REPORT*  Clinical Data: Abnormal chest radiograph, cough  CT CHEST WITH CONTRAST  Technique: Multidetector CT imaging of the chest was performed  following the standard protocol during bolus administration of  intravenous contrast.  Contrast: 75 ml Omnipaque-300 IV  Comparison: Chest radiographs dated 08/06/2008  Findings: 7.0 x 7.0 cm opacity in the right lung apex (series  2/image 10) which abuts the mediastinum. While some of this opacity  may reflect surrounding atelectasis, the majority is felt to  reflect tumor which extends inferiorly and obstructs the right  upper lobe bronchus.  Lungs are otherwise clear. No pleural effusion or pneumothorax.  Right paratracheal lymph nodes measuring up to 13 mm short axis  (series 2/image 22). No hilar or axillary lymphadenopathy.  Visualized thyroid is unremarkable.  The heart is normal in size. No pericardial effusion.  Visualized upper abdomen is notable for cholelithiasis and a left  renal cyst.  Visualized osseous structures are within normal limits. No focal  osseous lesions.  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  Right paratracheal lymphadenopathy measuring up to 1.3 cm short-  axis.  Original Report Authenticated By: Charline Bills,  M.D.       Impression: He has a 7 x 7 cm opacity in the right lung apex which is concerning for a malignant process. I think the majority of this is probably atelectasis of the azygos lobe. He probably has obstruction of the bronchus to the azygos lobe which could be from a benign or malignant process. I reviewed his old chest x-rays from 2008 when he was treated for bronchitis by Dr. Craige Cotta of pulmonary medicine. It is interesting that he had collapse of the azygos lobe at that time on his chest x-ray and a followup chest x-ray 4 days later showed reexpansion of that lobe. He said that he underwent bronchoscopy by Dr. Craige Cotta which only showed some secretions within the bronchus. With this history from  2008 I am more concerned about this being a benign process but we have to rule out a malignant process. There is not a well-defined mass in the right upper lobe causing bronchial obstruction. I will order a PET scan to evaluate this further and then consider proceeding with bronchoscopy with biopsies.  Plan: I will schedule a PET scan as quickly as possible and we'll see him back in the office after that is completed to discuss the results with him and make further plans for diagnosis and treatment of this lesion. I discussed my impression, recommendations, and plans with the patient and his wife. They understand and agree.

## 2011-01-05 ENCOUNTER — Encounter (HOSPITAL_COMMUNITY)
Admission: RE | Admit: 2011-01-05 | Discharge: 2011-01-05 | Disposition: A | Discharge status: Home / Self Care | Payer: BC Managed Care – PPO | Source: Ambulatory Visit | Attending: Surgery | Admitting: Surgery

## 2011-01-05 ENCOUNTER — Other Ambulatory Visit: Payer: Self-pay | Admitting: Surgery

## 2011-01-05 DIAGNOSIS — R222 Localized swelling, mass and lump, trunk: Secondary | ICD-10-CM | POA: Insufficient documentation

## 2011-01-05 DIAGNOSIS — R918 Other nonspecific abnormal finding of lung field: Secondary | ICD-10-CM

## 2011-01-05 DIAGNOSIS — K8020 Calculus of gallbladder without cholecystitis without obstruction: Secondary | ICD-10-CM | POA: Insufficient documentation

## 2011-01-05 LAB — GLUCOSE, CAPILLARY: Glucose-Capillary: 113 mg/dL — ABNORMAL HIGH (ref 70–99)

## 2011-01-05 MED ORDER — FLUDEOXYGLUCOSE F - 18 (FDG) INJECTION
19.4000 | Freq: Once | INTRAVENOUS | Status: AC | PRN
  Administered 2011-01-05: 19.4 via INTRAVENOUS

## 2011-01-06 ENCOUNTER — Ambulatory Visit (INDEPENDENT_AMBULATORY_CARE_PROVIDER_SITE_OTHER): Payer: BC Managed Care – PPO | Admitting: Surgery

## 2011-01-06 ENCOUNTER — Encounter: Payer: Self-pay | Admitting: Surgery

## 2011-01-06 VITALS — BP 110/68 | HR 74 | Resp 18 | Ht 72.0 in | Wt 230.0 lb

## 2011-01-06 DIAGNOSIS — R222 Localized swelling, mass and lump, trunk: Secondary | ICD-10-CM

## 2011-01-07 ENCOUNTER — Encounter: Payer: Self-pay | Admitting: Surgery

## 2011-01-07 ENCOUNTER — Ambulatory Visit (HOSPITAL_COMMUNITY)
Admission: RE | Admit: 2011-01-07 | Discharge: 2011-01-07 | Disposition: A | Discharge status: Home / Self Care | Payer: BC Managed Care – PPO | Source: Ambulatory Visit | Attending: Surgery | Admitting: Surgery

## 2011-01-07 ENCOUNTER — Encounter (HOSPITAL_COMMUNITY)
Admission: RE | Admit: 2011-01-07 | Discharge: 2011-01-07 | Disposition: A | Discharge status: Home / Self Care | Payer: BC Managed Care – PPO | Source: Ambulatory Visit | Attending: Surgery | Admitting: Surgery

## 2011-01-07 ENCOUNTER — Other Ambulatory Visit: Payer: Self-pay | Admitting: Surgery

## 2011-01-07 DIAGNOSIS — Z01818 Encounter for other preprocedural examination: Secondary | ICD-10-CM | POA: Insufficient documentation

## 2011-01-07 DIAGNOSIS — I10 Essential (primary) hypertension: Secondary | ICD-10-CM | POA: Insufficient documentation

## 2011-01-07 DIAGNOSIS — R52 Pain, unspecified: Secondary | ICD-10-CM

## 2011-01-07 DIAGNOSIS — Z01812 Encounter for preprocedural laboratory examination: Secondary | ICD-10-CM | POA: Insufficient documentation

## 2011-01-07 DIAGNOSIS — J984 Other disorders of lung: Secondary | ICD-10-CM | POA: Insufficient documentation

## 2011-01-07 LAB — CBC
Hemoglobin: 15.7 g/dL (ref 13.0–17.0)
MCH: 32.8 pg (ref 26.0–34.0)
RBC: 4.79 MIL/uL (ref 4.22–5.81)
RDW: 12.9 % (ref 11.5–15.5)
WBC: 12.5 10*3/uL — ABNORMAL HIGH (ref 4.0–10.5)

## 2011-01-07 LAB — COMPREHENSIVE METABOLIC PANEL
BUN: 9 mg/dL (ref 6–23)
CO2: 30 mEq/L (ref 19–32)
Calcium: 9.8 mg/dL (ref 8.4–10.5)
GFR calc Af Amer: >60 mL/min (ref >60)
GFR calc non Af Amer: >60 mL/min (ref >60)
Potassium: 5.5 mEq/L — ABNORMAL HIGH (ref 3.5–5.1)
Total Bilirubin: 0.2 mg/dL — ABNORMAL LOW (ref 0.3–1.2)

## 2011-01-07 LAB — APTT: aPTT: 35 seconds (ref 24–37)

## 2011-01-07 NOTE — Progress Notes (Signed)
HPI: Patient returns today to discuss the results of his PET scan.  Current Outpatient Prescriptions  Medication Sig Dispense Refill  . amLODipine (NORVASC) 10 MG tablet Take 10 mg by mouth daily.        Marland Kitchen atorvastatin (LIPITOR) 40 MG tablet Take 40 mg by mouth daily.        . budesonide-formoterol (SYMBICORT) 160-4.5 MCG/ACT inhaler Inhale 2 puffs into the lungs 2 (two) times daily.        Marland Kitchen losartan (COZAAR) 100 MG tablet Take 100 mg by mouth daily.        . nebivolol (BYSTOLIC) 10 MG tablet Take 10 mg by mouth daily.        . niacin (NIASPAN) 500 MG CR tablet Take 500 mg by mouth at bedtime.        Marland Kitchen zolpidem (AMBIEN CR) 12.5 MG CR tablet Take 12.5 mg by mouth at bedtime as needed.              Diagnostic Tests:  *RADIOLOGY REPORT*  Clinical Data: Initial treatment strategy for right upper lobe  lung mass.  NUCLEAR MEDICINE PET CT INITIAL (PI) SKULL BASE TO THIGH  Technique: 19.4 mCi F-18 FDG was injected intravenously via the  right antecubital fossa. Full-ring PET imaging was performed from  the skull base through the mid-thighs 60 minutes after injection.  CT data was obtained and used for attenuation correction and  anatomic localization only. (This was not acquired as a diagnostic  CT examination.)  Fasting Blood Glucose: 113  Patient Weight: 230 pounds.  Comparison: CT chest dated 12/23/2010  Findings: Stable right apical opacity with right upper lobe  collapse and narrowing of the right upper lobe bronchus (series  2/image 76). Heterogeneous, mildly increased FDG uptake within the  lesion, with max SUV 4.7. However, in the area of bronchial  narrowing, no FDG-avid centrally obstructing mass is seen.  1.1 cm short-axis right paratracheal lymph node (series 2/image  66), relatively non-FDG-avid, with max SUV 2.2.  Otherwise, no suspicious hypermetabolic foci in the chest. No  additional suspicious pulmonary nodules. No pleural effusion or  pneumothorax.  No  hypermetabolic foci in the neck. No suspicious cervical  lymphadenopathy.  No suspicious hypermetabolic foci in the abdomen/pelvis. No  suspicious abdominopelvic lymphadenopathy. Cholelithiasis, without  associated inflammatory changes. Left upper pole renal cyst. No  evidence of bowel obstruction.  No focal osseous lesions.  IMPRESSION:  Stable right apical opacity with right upper lobe collapse and  narrowing of the right upper lobe bronchus. Heterogeneous, mildly  increased FDG uptake, max SUV 4.7. No FDG-avid centrally  obstructing mass. While neoplasm is certainly still possible, this  appearance also raises the possibility of non-neoplastic etiologies  leading to right upper lobe collapse. Bronchoscopy is suggested.  1.1 cm short-axis right paratracheal lymph node, relatively non-FDG-  avid, max SUV 2.2.  No suspicious hypermetabolic foci in the neck, abdomen, or pelvis.  Original Report Authenticated By: Charline Bills, M.D.  Impression: He continues to have right apical opacity within what I think is an azygos lobe. There is mild hypermetabolic uptake within that area. Within the area of bronchial narrowing there is no obstructing mass seen. The right paratracheal lymph nodes seen on CT scan have minimal hypermetabolic activity. There are no other suspicious hypermetabolic foci seen. He obviously has some obstruction of the bronchus supplying this area of the lung. This could be a benign or malignant process. I think would be best to proceed with bronchoscopy to directly  inspect the bronchus out as far as possible. I would also plan to perform biopsies, brushings, and washings to assess for a malignant process. It is possible that may not see any visible abnormality and all the biopsies may be negative for malignancy. Even if it can't diagnose a malignant process he may still require lobectomy if this area the lung continues to be collapsed.  Plan: I've schedule bronchoscopy on  Friday, 01/09/2011. I will plan to see him back in the office the following Tuesday to discuss the results with him.     2

## 2011-01-09 ENCOUNTER — Other Ambulatory Visit: Payer: Self-pay | Admitting: Surgery

## 2011-01-09 ENCOUNTER — Ambulatory Visit (HOSPITAL_COMMUNITY)
Admission: RE | Admit: 2011-01-09 | Discharge: 2011-01-09 | Disposition: A | Discharge status: Home / Self Care | Payer: BC Managed Care – PPO | Source: Ambulatory Visit | Attending: Surgery | Admitting: Surgery

## 2011-01-09 ENCOUNTER — Ambulatory Visit (HOSPITAL_COMMUNITY): Payer: BC Managed Care – PPO

## 2011-01-09 DIAGNOSIS — Z79899 Other long term (current) drug therapy: Secondary | ICD-10-CM | POA: Insufficient documentation

## 2011-01-09 DIAGNOSIS — D381 Neoplasm of uncertain behavior of trachea, bronchus and lung: Secondary | ICD-10-CM

## 2011-01-09 DIAGNOSIS — I10 Essential (primary) hypertension: Secondary | ICD-10-CM | POA: Insufficient documentation

## 2011-01-09 DIAGNOSIS — K219 Gastro-esophageal reflux disease without esophagitis: Secondary | ICD-10-CM | POA: Insufficient documentation

## 2011-01-09 DIAGNOSIS — Z7982 Long term (current) use of aspirin: Secondary | ICD-10-CM | POA: Insufficient documentation

## 2011-01-09 DIAGNOSIS — J984 Other disorders of lung: Secondary | ICD-10-CM | POA: Insufficient documentation

## 2011-01-09 DIAGNOSIS — G4733 Obstructive sleep apnea (adult) (pediatric): Secondary | ICD-10-CM | POA: Insufficient documentation

## 2011-01-09 DIAGNOSIS — J9819 Other pulmonary collapse: Secondary | ICD-10-CM | POA: Insufficient documentation

## 2011-01-09 DIAGNOSIS — F17200 Nicotine dependence, unspecified, uncomplicated: Secondary | ICD-10-CM | POA: Insufficient documentation

## 2011-01-12 DIAGNOSIS — R918 Other nonspecific abnormal finding of lung field: Secondary | ICD-10-CM

## 2011-01-13 ENCOUNTER — Ambulatory Visit (INDEPENDENT_AMBULATORY_CARE_PROVIDER_SITE_OTHER): Payer: BC Managed Care – PPO | Admitting: Surgery

## 2011-01-13 ENCOUNTER — Other Ambulatory Visit: Payer: Self-pay | Admitting: Surgery

## 2011-01-13 ENCOUNTER — Encounter: Payer: Self-pay | Admitting: Surgery

## 2011-01-13 VITALS — BP 117/69 | HR 69 | Resp 16 | Ht 72.0 in | Wt 230.0 lb

## 2011-01-13 DIAGNOSIS — R222 Localized swelling, mass and lump, trunk: Secondary | ICD-10-CM

## 2011-01-13 DIAGNOSIS — D491 Neoplasm of unspecified behavior of respiratory system: Secondary | ICD-10-CM

## 2011-01-14 ENCOUNTER — Encounter: Payer: Self-pay | Admitting: Surgery

## 2011-01-14 NOTE — Progress Notes (Signed)
HPI:  Mr. Acy returns today to discuss the results of his recent bronchoscopy done last Friday. The orifice of the right upper lobe bronchus appeared to be completely occluded externally with tight puckering of the bronchial orifice. There was some mucus protruding from the orifice but there was no endobronchial lesion seen. The biopsies, brushings, and washings of the right upper lobe bronchus showed no evidence of malignancy. They did show normal bronchial mucosa as well as some reparative changes.  Current Outpatient Prescriptions  Medication Sig Dispense Refill  . amLODipine (NORVASC) 10 MG tablet Take 10 mg by mouth daily.        Marland Kitchen atorvastatin (LIPITOR) 40 MG tablet Take 40 mg by mouth daily.        . budesonide-formoterol (SYMBICORT) 160-4.5 MCG/ACT inhaler Inhale 2 puffs into the lungs 2 (two) times daily.        Marland Kitchen losartan (COZAAR) 100 MG tablet Take 100 mg by mouth daily.        . nebivolol (BYSTOLIC) 10 MG tablet Take 10 mg by mouth daily.        . niacin (NIASPAN) 500 MG CR tablet Take 500 mg by mouth at bedtime.        Marland Kitchen zolpidem (AMBIEN CR) 12.5 MG CR tablet Take 12.5 mg by mouth at bedtime as needed.           Physical Exam: BP 117/69  Pulse 69  Resp 16  Ht 6' (1.829 m)  Wt 230 lb (104.327 kg)  BMI 31.19 kg/m2  SpO2 93%  He looks well. Lungs-decreased breath sounds over the right upper lobe.  Diagnostic Tests: None performed today  Impression: The patient has complete right upper lobe bronchial obstruction at the orifice by bronchoscopy. The etiology of this is unclear but it certainly could be a malignant process even though biopsies were negative. I think this requires right upper lobectomy regardless of whether this is a malignant or benign process since there is complete obstruction of the right upper lobe bronchus. If this is a malignant process and the bronchial margin is positive for cancer at the time of surgery then he would require a sleeve resection with  reanastomosis of the right mainstem bronchus and bronchus intermedius. I discussed the recommendations and operative plans with the patient and his wife. We discussed alternatives, benefits, and risks including not limited to bleeding, blood transfusion, infection, bronchopleural fistula and breakdown of the bronchial anastomosis. I would plan to use an intercostal muscle flap around the bronchial anastomosis if a sleeve resection is needed.  Plan: We will obtain pulmonary function tests and a followup chest x-ray on Monday and plan to proceed with surgery on Wednesday, 01/21/2011.

## 2011-01-19 ENCOUNTER — Other Ambulatory Visit (HOSPITAL_COMMUNITY): Payer: BC Managed Care – PPO

## 2011-01-20 ENCOUNTER — Encounter (HOSPITAL_COMMUNITY)
Admission: RE | Admit: 2011-01-20 | Discharge: 2011-01-20 | Disposition: A | Discharge status: Home / Self Care | Payer: BC Managed Care – PPO | Source: Ambulatory Visit | Attending: Surgery | Admitting: Surgery

## 2011-01-20 ENCOUNTER — Other Ambulatory Visit: Payer: Self-pay | Admitting: Surgery

## 2011-01-20 ENCOUNTER — Ambulatory Visit (HOSPITAL_COMMUNITY)
Admission: RE | Admit: 2011-01-20 | Discharge: 2011-01-20 | Disposition: A | Discharge status: Home / Self Care | Payer: BC Managed Care – PPO | Source: Ambulatory Visit | Attending: Surgery | Admitting: Surgery

## 2011-01-20 DIAGNOSIS — R911 Solitary pulmonary nodule: Secondary | ICD-10-CM

## 2011-01-20 DIAGNOSIS — J984 Other disorders of lung: Secondary | ICD-10-CM | POA: Insufficient documentation

## 2011-01-20 DIAGNOSIS — Z01811 Encounter for preprocedural respiratory examination: Secondary | ICD-10-CM | POA: Insufficient documentation

## 2011-01-20 LAB — COMPREHENSIVE METABOLIC PANEL
ALT: 32 U/L (ref 0–53)
CO2: 24 mEq/L (ref 19–32)
Calcium: 9.3 mg/dL (ref 8.4–10.5)
Chloride: 103 mEq/L (ref 96–112)
Creatinine, Ser: 0.89 mg/dL (ref 0.50–1.35)
GFR calc Af Amer: >60 mL/min (ref >60)
GFR calc non Af Amer: >60 mL/min (ref >60)
Glucose, Bld: 125 mg/dL — ABNORMAL HIGH (ref 70–99)
Total Bilirubin: 0.2 mg/dL — ABNORMAL LOW (ref 0.3–1.2)

## 2011-01-20 LAB — BLOOD GAS, ARTERIAL
Drawn by: 206361
TCO2: 26 mmol/L (ref 0–100)
pO2, Arterial: 85.6 mmHg (ref 80.0–100.0)

## 2011-01-20 LAB — TYPE AND SCREEN: Antibody Screen: NEGATIVE

## 2011-01-20 LAB — CBC
Hemoglobin: 15.4 g/dL (ref 13.0–17.0)
MCH: 32.5 pg (ref 26.0–34.0)
MCHC: 35.8 g/dL (ref 30.0–36.0)
MCV: 90.7 fL (ref 78.0–100.0)
RBC: 4.74 MIL/uL (ref 4.22–5.81)

## 2011-01-20 LAB — URINALYSIS, ROUTINE W REFLEX MICROSCOPIC
Bilirubin Urine: NEGATIVE
Ketones, ur: NEGATIVE mg/dL
Leukocytes, UA: NEGATIVE
Nitrite: NEGATIVE
Protein, ur: NEGATIVE mg/dL
Urobilinogen, UA: 0.2 mg/dL (ref 0.0–1.0)

## 2011-01-20 LAB — ABO/RH: ABO/RH(D): A POS

## 2011-01-21 ENCOUNTER — Inpatient Hospital Stay (HOSPITAL_COMMUNITY): Payer: BC Managed Care – PPO

## 2011-01-21 ENCOUNTER — Other Ambulatory Visit: Payer: Self-pay | Admitting: Surgery

## 2011-01-21 ENCOUNTER — Inpatient Hospital Stay (HOSPITAL_COMMUNITY)
Admission: RE | Admit: 2011-01-21 | Discharge: 2011-01-26 | DRG: 538 | Disposition: A | Discharge status: Home / Self Care | Payer: BC Managed Care – PPO | Source: Ambulatory Visit | Attending: Surgery | Admitting: Surgery

## 2011-01-21 DIAGNOSIS — J4489 Other specified chronic obstructive pulmonary disease: Secondary | ICD-10-CM | POA: Diagnosis present

## 2011-01-21 DIAGNOSIS — I10 Essential (primary) hypertension: Secondary | ICD-10-CM | POA: Diagnosis present

## 2011-01-21 DIAGNOSIS — J988 Other specified respiratory disorders: Principal | ICD-10-CM | POA: Diagnosis present

## 2011-01-21 DIAGNOSIS — J9819 Other pulmonary collapse: Secondary | ICD-10-CM | POA: Diagnosis present

## 2011-01-21 DIAGNOSIS — E785 Hyperlipidemia, unspecified: Secondary | ICD-10-CM | POA: Diagnosis present

## 2011-01-21 DIAGNOSIS — J398 Other specified diseases of upper respiratory tract: Principal | ICD-10-CM | POA: Diagnosis present

## 2011-01-21 DIAGNOSIS — J9383 Other pneumothorax: Secondary | ICD-10-CM | POA: Diagnosis present

## 2011-01-21 DIAGNOSIS — D381 Neoplasm of uncertain behavior of trachea, bronchus and lung: Secondary | ICD-10-CM

## 2011-01-21 DIAGNOSIS — J449 Chronic obstructive pulmonary disease, unspecified: Secondary | ICD-10-CM | POA: Diagnosis present

## 2011-01-21 DIAGNOSIS — J309 Allergic rhinitis, unspecified: Secondary | ICD-10-CM | POA: Diagnosis present

## 2011-01-21 DIAGNOSIS — Z87891 Personal history of nicotine dependence: Secondary | ICD-10-CM

## 2011-01-21 HISTORY — PX: LUNG SURGERY: SHX703

## 2011-01-22 ENCOUNTER — Inpatient Hospital Stay (HOSPITAL_COMMUNITY): Payer: BC Managed Care – PPO

## 2011-01-22 LAB — BASIC METABOLIC PANEL
BUN: 10 mg/dL (ref 6–23)
CO2: 27 mEq/L (ref 19–32)
Chloride: 102 mEq/L (ref 96–112)
Creatinine, Ser: 0.75 mg/dL (ref 0.50–1.35)
Glucose, Bld: 141 mg/dL — ABNORMAL HIGH (ref 70–99)
Potassium: 3.7 mEq/L (ref 3.5–5.1)

## 2011-01-22 LAB — POCT I-STAT 3, ART BLOOD GAS (G3+)
Bicarbonate: 28.3 mEq/L — ABNORMAL HIGH (ref 20.0–24.0)
Patient temperature: 98.4
TCO2: 30 mmol/L (ref 0–100)
pH, Arterial: 7.368 (ref 7.350–7.450)
pO2, Arterial: 135 mmHg — ABNORMAL HIGH (ref 80.0–100.0)

## 2011-01-22 LAB — CBC
HCT: 39.2 % (ref 39.0–52.0)
Hemoglobin: 13.4 g/dL (ref 13.0–17.0)
MCV: 92 fL (ref 78.0–100.0)
RDW: 13.3 % (ref 11.5–15.5)
WBC: 17.8 10*3/uL — ABNORMAL HIGH (ref 4.0–10.5)

## 2011-01-22 NOTE — Op Note (Signed)
NAMEILLIAS, PANTANO NO.:  [id number]  MEDICAL RECORD NO.:  [id number]  LOCATION:  [facility]                         FACILITY:  [facility]  PHYSICIAN:  Evelene Croon, M.D.     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  PROCEDURE:  Flexible fiberoptic bronchoscopy with biopsies, brushings, and washings of the right upper lobe.  SURGEON:  Evelene Croon, MD  ANESTHESIA:  General endotracheal.  ESTIMATED BLOOD LOSS:  None.  CLINICAL HISTORY:  This patient is a 60 year old gentleman who presented with dyspnea, cough, and wheezing to an outside hospital several weeks ago.  He was treated with antibiotics and a chest x-ray suggested a anterior mediastinal mass.  He came home from [place] and presented to his primary physician.  He had a CT scan of the chest performed which did not show any anterior mediastinal mass but did show collapse of what appeared to be an azygous lobe.  The right upper lobe bronchus appeared to be obstructed but there was no well defined mass seen obstructing the bronchus.  There were some mildly enlarged lymph nodes along the right paratracheal region.  We did a PET scan to further evaluate this and this showed mild heterogeneous uptake within the collapsed area of the right upper lobe but no clearly hypermetabolic well defined mass in the right upper lobe.  There was also very mild uptake within the right paratracheal lymph nodes.  I was still concerned about possibility of malignancy but was considering a nonmalignant process since the patient had similar problems with the right upper lobe in 2008 and underwent flexible bronchoscopy by Dr. Craige Cotta of Pulmonary Medicine and as far  as I can tell, no definite abnormality was seen other than a lot of secretions in the airway which were removed.  After this the right upper lobe opened up.  I felt the best procedure to try to make the diagnosis would be flexible bronchoscopy with biopsies, brushings, and washings. I discussed the procedure with the patient including alternatives, benefits, and risks including but not limited to bleeding, injury to the airway or lung, pneumothorax, and inability to make a diagnosis.  He understood and agreed to proceed.  OPERATIVE PROCEDURE:  The patient was seen in the preoperative holding area and the proper patient, proper procedure were confirmed with the patient and the chart and his CT scan of the chest was reviewed. Consent was signed.  The patient was taken back to the operating room. After induction of general endotracheal anesthesia using a large single- lumen tube, flexible fiberoptic bronchoscopy was performed.  Time-out was taken, proper patient, proper operation were confirmed with nursing anesthesia staff.  Then the 2 mm bronchoscope was advanced down the endotracheal tube.  The distal trachea appeared sharp.  The carina was sharp.  There were diffuse tiny hyperpigmented lesions seen throughout the bronchial tree which did not appear to be raised or to be of any significance.  The bronchoscope was  advanced down the left main stem bronchus and left bronchial tree had normal segmental anatomy without endobronchial lesions or extrinsic compression.  The bronchoscope was withdrawn back to the carina and then passed on the right mainstem bronchus.  The right mainstem bronchus itself appeared normal.  The opening of the right upper lobe bronchus was completely obstructed. There was no definite mass visible but the orifice was completely obstructed and puckered and retracted and looks similar to an anus.  The surrounding portion of the mainstem bronchus appeared normal and  the carina between the upper lobe and bronchus intermedius appeared unremarkable.  The bronchus intermedius itself was wide open without endobronchial lesions.  The lower lobe bronchi were also normal.  Then biopsies were taken of the right upper lobe.  I was unable to pass a biopsy forceps less than a centimeter through the area of obstruction. I tried manipulating this in multiple positions but could not get to __________.  Then multiple biopsies were taken of the right upper lobe bronchus about 8-10 biopsies.  I also took 8-10 biopsies of the bronchial orifice in different positions.  These were all sent to Pathology for permanent section.  Then brushings were taken of the right upper lobe bronchus and again the brush would only pass out less than a centimeter beyond the orifice.  Washings were also taken and sent to Pathology.  There was complete hemostasis.  The patient tolerated the procedure well and was awakened and extubated and transferred to the Post Anesthesia Care Unit in satisfactory and stable condition.     Evelene Croon, M.D.     BB/MEDQ  D:  01/09/2011  T:  01/09/2011  Job:  [id number]  Electronically Signed by Evelene Croon M.D. on 01/22/2011 04:28:35 PM

## 2011-01-22 NOTE — Op Note (Signed)
Vincent Short NO.:  [id number]  MEDICAL RECORD NO.:  [id number]  LOCATION:  2899                         FACILITY:  [facility]  PHYSICIAN:  Evelene Croon, M.D.     DATE OF BIRTH:  [birth year]  DATE OF PROCEDURE:  01/21/2011 DATE OF DISCHARGE:                              OPERATIVE REPORT   PREOPERATIVE DIAGNOSIS:  Right upper lobe bronchial obstruction.  POSTOPERATIVE DIAGNOSIS:  Right upper lobe bronchial obstruction.  OPERATIVE PROCEDURES: 1. Flexible fiberoptic bronchoscopy. 2. Right thoracotomy with right upper lobectomy.  SURGEON:  Evelene Croon, MD  ASSISTANT:  Ines Bloomer, MD  SECOND ASSISTANT:  Rowe Clack, [facility]  ANESTHESIA:  General endotracheal.  CLINICAL HISTORY:  This patient is a 60 year old smoker who presented recently with onset of cough, wheezing, and dyspnea while in [place]. A chest x-ray showed partial collapse of the right upper lobe.  The patient was treated for possible bronchitis and was told that he should follow up with his physician when he got home.  He had a CT scan done when he returned to [place] and this showed partial collapse of the right upper lobe and what appeared to be an azygos lobe.  There appeared to be some obstruction of the right upper lobe bronchus seen on CT scan.  There was no definite mass lesion.  There were a few mildly enlarged lymph nodes along the right paratracheal area.  There was some concern that this may represent a malignancy and the patient was therefore referred to my office by his primary physician, Dr. Tanya Nones. We obtained a PET scan, which showed heterogeneous mildly increased uptake within the lesion with a maximum SUV of 4.7.  In the area of the bronchial narrowing, there was no definite obstructing mass seen.  There was a 1.1-cm right paratracheal lymph node, which did have significant uptake of the tracer.  There were no other suspicious hypermetabolic foci within the  chest or elsewhere.  After review of these findings, I felt the next best test was to be proceed with flexible fiberoptic bronchoscopy to examine the right upper lobe bronchus and take biopsies. This was performed last week by me.  The orifice of the right upper lobe bronchus appeared to be constricted and essentially obstructed.  There was some mucus coming from the orifice, but there was no definite lumen. The orifice appeared puckered.  There were no endobronchial lesions seen.  I was not able to pass the bronchoscope through this area and could not even pass a biopsy forceps about very far.  I did take biopsies, brushings, and washings of the area around the bronchial opening as well as just inside the orifice as far as I could push the biopsy forceps.  All of these biopsies, brushings, and washings came back showing no evidence of malignancy.  This showed normal bronchial mucosa with some reparative changes.  Therefore, I thought the next best option was going to be proceeding with right upper lobectomy due to the bronchial obstruction.  I thought that this probably would require lobectomy regardless of whether this was a benign or malignant process. He did have a similar episode of this in 2008 that was  evaluated by Pulmonary Medicine and the only finding on bronchoscopy was apparently a lot of secretions in the right upper lobe bronchus.  These were suctioned and a few days later the right upper lobe reinflated.  This made me think this was more likely be a benign process.  I discussed the operative procedure of right thoracotomy with right upper lobectomy with the patient and his family.  I discussed the possible need for a sleeve lobectomy if there was malignancy seen at the bronchial margin.  I discussed alternatives, benefits, and risks including but not limited to bleeding, blood transfusion, infection, bronchial anastomotic leak, bronchopleural fistula, prolonged air leak, and  the possibility that this may not be a malignant lesion.  They understood and agreed to proceed.  OPERATIVE PROCEDURE:  The patient was seen in the preoperative holding area.  His chest x-ray from yesterday was again reviewed and showed reexpansion of the right upper lobe.  There was still some thickening around the right paratracheal region of the right upper lobe appeared well expanded.  I discussed the results with the patient.  I felt it would be best to proceed with flexible bronchoscopy and possible lung resection depending on those results.  The proper patient, proper operation, and proper operative side were confirmed by reviewing the patient's x-rays as well as discussion with the patient and review of his chart.  The right-sided chest was signed by me.  The patient was then taken back to the operating room and placed on the table in supine position.  After induction of general endotracheal anesthesia, a Foley catheter was placed in the bladder using sterile technique.  Lower extremity pneumatic compression devices were used.  Preoperative intravenous antibiotics were given.  Then, a time-out was taken and the proper patient and proper operation were confirmed with nursing and anesthesia staff.  Then, flexible fiberoptic bronchoscopy was performed through the single- lumen tube.  The distal trachea appeared normal.  The carina was sharp. Left bronchial tree again appeared within normal limits without secretions.  The right upper lobe bronchial orifice was still very abnormal and puckered and significantly constricted.  It did look a little more opened than it did on his previous bronchoscopy.  There were abundant secretions present and these were suctioned and removed.  I was able to pass a 2-mm bronchoscope out to the segmental bronchial openings.  These again appeared constricted.  No endobronchial lesions were specifically seen.  I felt it would best to proceed ahead  with right upper lobectomy given this marked abnormality of the right upper lobe bronchus.  The bronchoscope was then withdrawn from the patient. The single-lumen tube was converted to a double-lumen tube by anesthesiology and the patient turned in left lateral decubitus position with the right side up.  The right side of the chest was then prepped with Betadine soap and solution and draped in the usual sterile manner. A time-out was again taken and proper operative side was confirmed with nursing and anesthesia staff as well as review of his x-rays.  Then, the right chest was opened through a lateral thoracotomy incision.  The patient had very thick muscles and the latissimus dorsi muscle was partially divided.  The serratus muscle was mobilized and it was not divided.  The pleural space was then entered through the fifth intercostal space.  The chest retractors were placed.  There was excellent exposure.  Examination of the lung showed that there were three lobes with complete fissures.  There was thickening  around the right upper lobe bronchus that was palpable.  There was no definite mass.  There were multiple lymph nodes around the bronchus.  Then, we proceeded with right upper lobectomy.  The major fissure was opened and the interlobar portion of the pulmonary artery was identified.  The posterior descending branch was encircled and divided using vascular stapler.  Then, the lung was retracted inferiorly and the mediastinal pleura opened.  Dissection was continued down along the right pulmonary artery.  The two branches of the pulmonary artery to the right upper lobe were easily encircled.  These were divided using vascular stapler. Then, the right upper lobe branches to the superior pulmonary vein were identified and encircled.  The pulmonary vein to the middle lobe was identified and preserved.  The upper lobe pulmonary veins were divided using vascular staplers.  Then, the lung  was retracted medially and the posterior mediastinal pleura was opened.  The right upper lobe bronchus was identified.  There were multiple lymph nodes around this area and they were all mobilized off the bronchus and taken with the specimen. The right upper lobe bronchus was easily encircled.  It was divided flush with the right mainstem bronchus using a TL30 stapler.  The bronchus was then occluded with the stapler and the right lung inflated. The right middle and lower lobes completely inflated.  The stapler was then fired and the right upper lobe bronchus was divided just distal to the stapler.  Examination of the bronchial stump showed that it was definitely abnormal.  There was marked thickening of the wall and occlusion of the bronchial lumen.  The specimen sent to Pathology and frozen section done on the bronchial margin.  The pathologist called the operating room and said there was no evidence of any cancer of the bronchial margin.  Then, the chest was filled with saline and the bronchial stump tested at 30 cm of water and there was no air leak present.  There was complete hemostasis.  The inferior pulmonary ligament was divided up to the inferior pulmonary vein.  Then, the middle and lower lobes were pexed together using two 3-0 Vicryl sutures to prevent rotation of the middle lobe.  Then, an On-Q pain catheter was brought through a separate stab incision and tunneled in a subpleural location from inferior to the incision to a location posterior to the thoracotomy incision about two interspaces above.  It was easily flushed with saline solution and connected to a pain ball with Marcaine.  Then, two chest tubes were placed with a 36-French right-angle chest tube positioned inferiorly in the posterior costophrenic sulcus and a 28- French straight tube was positioned posteriorly and up to the apex.  The right lung was then reinflated.  The ribs were reapproximated with #2 Vicryl  pericostal sutures.  The serratus muscle was returned and there was normal position.  The latissimus muscle was closed with a continuous #1 Vicryl suture.  Subcutaneous tissue was closed with continuous 2-0 Vicryl and skin with a 3-0 Vicryl subcuticular closure.  The sponge, needle, and counts were correct according to the scrub nurse.  Dry sterile dressing was applied over the incision and around the chest tubes which were hooked with Pleur-Evac suction.  The patient remained hemodynamically stable and transferred to the SICU in guarded but stable condition.     Evelene Croon, M.D.     BB/MEDQ  D:  01/21/2011  T:  01/21/2011  Job:  [id number]  Electronically Signed by Florence Canner.D.  on 01/22/2011 04:28:38 PM

## 2011-01-23 ENCOUNTER — Inpatient Hospital Stay (HOSPITAL_COMMUNITY): Payer: BC Managed Care – PPO

## 2011-01-23 LAB — COMPREHENSIVE METABOLIC PANEL
Albumin: 2.9 g/dL — ABNORMAL LOW (ref 3.5–5.2)
Alkaline Phosphatase: 64 U/L (ref 39–117)
BUN: 8 mg/dL (ref 6–23)
Calcium: 8.5 mg/dL (ref 8.4–10.5)
Creatinine, Ser: 0.7 mg/dL (ref 0.50–1.35)
GFR calc Af Amer: >60 mL/min (ref >60)
Glucose, Bld: 110 mg/dL — ABNORMAL HIGH (ref 70–99)
Potassium: 4.4 mEq/L (ref 3.5–5.1)
Total Protein: 5.5 g/dL — ABNORMAL LOW (ref 6.0–8.3)

## 2011-01-23 LAB — CBC
HCT: 40.6 % (ref 39.0–52.0)
Hemoglobin: 14.2 g/dL (ref 13.0–17.0)
MCH: 32.6 pg (ref 26.0–34.0)
MCHC: 35 g/dL (ref 30.0–36.0)
MCV: 93.1 fL (ref 78.0–100.0)
RDW: 13 % (ref 11.5–15.5)

## 2011-01-24 ENCOUNTER — Inpatient Hospital Stay (HOSPITAL_COMMUNITY): Payer: BC Managed Care – PPO

## 2011-01-25 ENCOUNTER — Inpatient Hospital Stay (HOSPITAL_COMMUNITY): Payer: BC Managed Care – PPO

## 2011-01-26 ENCOUNTER — Inpatient Hospital Stay (HOSPITAL_COMMUNITY): Payer: BC Managed Care – PPO

## 2011-02-02 ENCOUNTER — Other Ambulatory Visit: Payer: Self-pay

## 2011-02-02 ENCOUNTER — Ambulatory Visit (INDEPENDENT_AMBULATORY_CARE_PROVIDER_SITE_OTHER): Payer: Self-pay

## 2011-02-02 DIAGNOSIS — D381 Neoplasm of uncertain behavior of trachea, bronchus and lung: Secondary | ICD-10-CM

## 2011-02-02 DIAGNOSIS — Z9889 Other specified postprocedural states: Secondary | ICD-10-CM

## 2011-02-02 DIAGNOSIS — Z902 Acquired absence of lung [part of]: Secondary | ICD-10-CM

## 2011-02-02 NOTE — Progress Notes (Signed)
Remove to 2 sutures from chest tube sites on rt middle back. No signs of infection and pt tolerated well. S/P Rt thoracotomy with right upper lobectomy 01/21/11. Appt sch'ed on 02/09/11. Pt requesting pain medication refill. New RX for Hydrocodone 7.5/500 mg #40/0 called to [facility] on [address].

## 2011-02-05 ENCOUNTER — Other Ambulatory Visit: Payer: Self-pay | Admitting: Surgery

## 2011-02-05 DIAGNOSIS — D381 Neoplasm of uncertain behavior of trachea, bronchus and lung: Secondary | ICD-10-CM

## 2011-02-06 ENCOUNTER — Encounter: Payer: Self-pay | Admitting: *Deleted

## 2011-02-06 ENCOUNTER — Other Ambulatory Visit: Payer: Self-pay | Admitting: Surgery

## 2011-02-06 DIAGNOSIS — D381 Neoplasm of uncertain behavior of trachea, bronchus and lung: Secondary | ICD-10-CM

## 2011-02-09 ENCOUNTER — Ambulatory Visit
Admission: RE | Admit: 2011-02-09 | Discharge: 2011-02-09 | Disposition: A | Discharge status: Home / Self Care | Payer: BC Managed Care – PPO | Source: Ambulatory Visit | Attending: Surgery | Admitting: Surgery

## 2011-02-09 ENCOUNTER — Ambulatory Visit (INDEPENDENT_AMBULATORY_CARE_PROVIDER_SITE_OTHER): Payer: Self-pay | Admitting: Surgical

## 2011-02-09 VITALS — BP 104/68 | HR 72 | Resp 18 | Ht 72.0 in | Wt 230.0 lb

## 2011-02-09 DIAGNOSIS — D381 Neoplasm of uncertain behavior of trachea, bronchus and lung: Secondary | ICD-10-CM

## 2011-02-09 DIAGNOSIS — J479 Bronchiectasis, uncomplicated: Secondary | ICD-10-CM

## 2011-02-09 DIAGNOSIS — J8410 Pulmonary fibrosis, unspecified: Secondary | ICD-10-CM

## 2011-02-09 LAB — AFB STAIN

## 2011-02-09 LAB — FUNGAL STAIN

## 2011-02-09 MED ORDER — OXYCODONE HCL 5 MG PO CAPS: 5.0000 mg | ORAL_CAPSULE | ORAL | Status: DC | PRN

## 2011-02-09 MED ORDER — OXYCODONE HCL 5 MG PO TABS: 5.0000 mg | ORAL_TABLET | ORAL | Status: AC | PRN

## 2011-02-09 NOTE — Progress Notes (Signed)
  HPI: Patient returns for routine postoperative follow-up having undergone axil bronchoscopy and right thoracotomy with right upper lobectomy on 01/21/2011. This was done for a right upper lobe bronchial obstruction. 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 Additionally there was severe respiratory bronchiolitis. For full details of the pathology please see the dictated report. There was no evidence of malignancy. The patient's early postoperative recovery while in the hospital was notable for for being quite unremarkable. Since hospital discharge the patient reports that he is doing quite well. He is currently using nicotine dome for smoking cessation and hasn't smoked in over 5 weeks. He feels as though he is breathing comfortably but does note some shortness of breath with exertion. He also is having some moderate pain. He typically takes his pain medication approximately 3 times a day but this does very. He feels as though the oxycodone worked better than the hydrocodone which he is using currently. He has returned to driving. He is not having difficulty with this. He is returned to many of his usual activities without significant difficulty but is adhering to lifting restrictions.  Current Outpatient Prescriptions  Medication Sig Dispense Refill  . amLODipine (NORVASC) 10 MG tablet Take 10 mg by mouth daily.        Marland Kitchen atorvastatin (LIPITOR) 40 MG tablet Take 40 mg by mouth daily.        . budesonide-formoterol (SYMBICORT) 160-4.5 MCG/ACT inhaler Inhale 2 puffs into the lungs 2 (two) times daily.        Marland Kitchen losartan (COZAAR) 100 MG tablet Take 100 mg by mouth daily.        . nebivolol (BYSTOLIC) 10 MG tablet Take 10 mg by mouth daily.        . niacin (NIASPAN) 500 MG CR tablet Take 500 mg by mouth at bedtime.        Marland Kitchen oxycodone (OXY-IR) 5 MG capsule Take 5 mg by mouth every 4 (four) hours as needed.        Marland Kitchen PROVENTIL HFA 108 (90 BASE)  MCG/ACT inhaler       . zolpidem (AMBIEN CR) 12.5 MG CR tablet Take 12.5 mg by mouth at bedtime as needed.        Marland Kitchen HYDROcodone-acetaminophen (LORTAB) 7.5-500 MG per tablet Take 1 tablet by mouth every 6 (six) hours as needed.          Physical Exam: General appearance: Well-developed adult male in no acute distress. Pulmonary examination: Lung sounds are clear bilaterally. Cardiac examination: Normal S1-S2 regular rate and rhythm. Incision: The incisions are well-healed without evidence of infection.  Diagnostic Tests: A chest x-ray was performed on today's date and reviewed. It reveals some postoperative changes but no acute findings. There are no infiltrates lung is fully expanded.  Impression: The patient is doing quite well. He does request a refill for his pain medication which we will do. He will continue his gradual resumption of activities. He notes that he has not seen Dr.Soodin some time but if he has any further pulmonary difficulties he will arrange to see him.  Plan: We will see the patient again on a when necessary basis for any new surgically related issues.

## 2011-02-18 NOTE — Discharge Summary (Signed)
Vincent Short, Vincent Short NO.:  [id number]  MEDICAL RECORD NO.:  [id number]  LOCATION:  2041                         FACILITY:  [facility]  PHYSICIAN:  Vincent Short, M.D.     DATE OF BIRTH:  1950-05-19  DATE OF ADMISSION:  01/21/2011 DATE OF DISCHARGE:  01/26/2011                              DISCHARGE SUMMARY   PRIMARY ADMITTING DIAGNOSIS:  Right upper lobe bronchial obstruction.  ADDITIONAL/DISCHARGE DIAGNOSES: 1. Right upper lobe bronchial obstruction. 2. Hypertension. 3. Hyperlipidemia. 4. Chronic obstructive pulmonary disease. 5. History of tobacco abuse. 6. Allergic rhinitis. 7. History of asthma.  PROCEDURES PERFORMED: 1. Flexible fiberoptic bronchoscopy. 2. Right thoracotomy with right upper lobectomy.  HISTORY:  The patient is a 60 year old male who recently presented with acute onset of cough, wheezing, and dyspnea while in [place].  A chest x-ray performed at that time showed partial collapse of the right upper lobe.  He was treated for possible bronchitis and was told to follow up with his primary care physician when he got home.  When he returned to [facility], he saw his primary care physician Dr. Tanya Short and a CT scan was performed which showed partial collapse of the right upper lobe with some obstruction of the right upper lobe bronchus and no definite mass lesion.  There were few mildly enlarged lymph nodes along the right paratracheal area.  There was some concern that this might represent a malignancy and therefore the patient was referred to Dr. Evelene Short for consideration of surgical intervention.  He ordered a PET scan which showed mildly increased uptake in the lesion with a maximum SUV of 4.7. There was significant uptake in 1.1 cm right paratracheal lymph node. The patient then underwent a flexible fiberoptic bronchoscopy on September 14 which showed that the orifice of the right upper lobe bronchus was constricted and  essentially obstructed.  There was some mucus coming from the orifice but no endobronchial lesions were noted. All biopsies, brushings, and washings of the area were negative for malignancy.  It was Vincent Short opinion that the patient should proceed with a right upper lobectomy due to the obstruction.  He explained all risks, benefits, and alternatives of surgery to the patient and the patient agreed to proceed.  HOSPITAL COURSE:  [name] was admitted to [facility] on January 21, 2011 and was taken to the operating room where he underwent a right upper lobectomy as described above.  Please see previously dictated operative report for complete details of surgery. He tolerated the procedure well and was transferred to the SICU for further postoperative convalescence.  His postoperative course was generally uneventful.  He did remain in the unit for an additional 24 hours observation and then was able to be transferred to the Step-Down Unit.  His chest tubes have slowly been removed in the standard fashion. His followup chest x-rays have remained stable.  His final pathology was negative for malignancy showing benign bronchial tissue.  His most recent chest x-ray on January 25, 2011 showed stable bibasilar atelectasis with a tiny right apical pneumothorax.  The patient has remained afebrile and his vital signs have been stable.  He is ambulating in the halls  without difficulty.  He has been transferred to the Step-Down Unit and is progressing with mobility.  He is tolerating regular diet.  His most recent labs show sodium of 138, potassium 4.8, BUN 8, creatinine 0.7, white count 17.7, hemoglobin 14.2, hematocrit 40.6, platelets 200,000.  His incisions are all healing well.  He has been seen and evaluated on the morning of January 26, 2011 and is ready for discharge home at this time.  DISCHARGE MEDICATIONS: 1. Percocet 1-2 q.4-6 hours p.r.n. pain. 2. Align 4 mg  daily. 3. Amlodipine 10 mg daily. 4. Enteric-coated aspirin 325 mg daily. 5. Lipitor 40 mg daily. 6. Bystolic 10 mg daily. 7. Chantix 0.5 mg 1-2 tablets daily. 8. Imodium 2 mg daily p.r.n. 9. Losartan 100 mg daily. 10.Mucinex DM 1 tablet daily p.r.n. 11.Niaspan 500 mg daily. 12.Symbicort 2 puffs b.i.d. 13.Ambien 12.5 mg daily p.r.n.  DISCHARGE INSTRUCTIONS:  He is asked to refrain from driving, heavy lifting, or strenuous activity.  He may continue ambulating daily and using his incentive spirometer.  He may shower daily and clean his incisions with soap and water.  He will continue same preoperative diet.  DISCHARGE FOLLOWUP:  He will need to return to the office in 2 weeks for recheck by Dr. Sharee Pimple PA.  A chest x-ray will be performed 30 minutes prior to this visit.  In the interim if he experiences any problems or has questions, he is asked to contact our office.     Vincent Short, P.A.   ______________________________ Vincent Short, M.D.    GC/MEDQ  D:  01/26/2011  T:  01/26/2011  Job:  [id number]  cc:   Dr. Bernell Short Office  Electronically Signed by Vincent Short P.A. on 01/27/2011 10:55:15 AM Electronically Signed by Vincent Short M.D. on 02/18/2011 02:40:55 PM

## 2011-02-19 ENCOUNTER — Other Ambulatory Visit: Payer: Self-pay

## 2011-02-19 DIAGNOSIS — Z9889 Other specified postprocedural states: Secondary | ICD-10-CM

## 2011-02-19 DIAGNOSIS — Z902 Acquired absence of lung [part of]: Secondary | ICD-10-CM

## 2011-02-24 ENCOUNTER — Ambulatory Visit
Admission: RE | Admit: 2011-02-24 | Discharge: 2011-02-24 | Disposition: A | Discharge status: Home / Self Care | Payer: BC Managed Care – PPO | Source: Ambulatory Visit | Attending: Surgery | Admitting: Surgery

## 2011-02-24 ENCOUNTER — Encounter: Payer: Self-pay | Admitting: Surgery

## 2011-02-24 ENCOUNTER — Ambulatory Visit (INDEPENDENT_AMBULATORY_CARE_PROVIDER_SITE_OTHER): Payer: Self-pay | Admitting: Surgery

## 2011-02-24 VITALS — BP 113/82 | HR 68 | Resp 16 | Ht 72.0 in | Wt 225.0 lb

## 2011-02-24 DIAGNOSIS — Z902 Acquired absence of lung [part of]: Secondary | ICD-10-CM

## 2011-02-24 DIAGNOSIS — J189 Pneumonia, unspecified organism: Secondary | ICD-10-CM

## 2011-02-24 DIAGNOSIS — Z9889 Other specified postprocedural states: Secondary | ICD-10-CM

## 2011-02-24 DIAGNOSIS — J479 Bronchiectasis, uncomplicated: Secondary | ICD-10-CM

## 2011-02-24 NOTE — Progress Notes (Signed)
  HPI: Patient returns for routine postoperative follow-up having undergone right thoracotomy with right upper lobectomy on 01/21/2011. The patient's early postoperative recovery while in the hospital was notable for an uncomplicated postoperative course. Since hospital discharge the patient reports he has been feeling well overall. His only complaint is that he has noticed a rubbing sensation and an unusual sound when he moves his right arm. This is not causing him any pain. He also noticed a nontender lump on his right back just behind the chest incision.   Current Outpatient Prescriptions  Medication Sig Dispense Refill  . amLODipine (NORVASC) 10 MG tablet Take 10 mg by mouth daily.        Marland Kitchen atorvastatin (LIPITOR) 40 MG tablet Take 40 mg by mouth daily.        . budesonide-formoterol (SYMBICORT) 160-4.5 MCG/ACT inhaler Inhale 2 puffs into the lungs 2 (two) times daily.        Marland Kitchen losartan (COZAAR) 100 MG tablet Take 100 mg by mouth daily.        . nebivolol (BYSTOLIC) 10 MG tablet Take 10 mg by mouth daily.        . niacin (NIASPAN) 500 MG CR tablet Take 500 mg by mouth at bedtime.        Marland Kitchen oxycodone (OXY-IR) 5 MG capsule Take 1 capsule (5 mg total) by mouth every 4 (four) hours as needed for pain.  50 capsule  0  . PROVENTIL HFA 108 (90 BASE) MCG/ACT inhaler       . zolpidem (AMBIEN CR) 12.5 MG CR tablet Take 12.5 mg by mouth at bedtime as needed.        Marland Kitchen HYDROcodone-acetaminophen (LORTAB) 7.5-500 MG per tablet Take 1 tablet by mouth every 6 (six) hours as needed.          Physical Exam:  BP 113/82  Pulse 68  Resp 16  Ht 6' (1.829 m)  Wt 225 lb (102.059 kg)  BMI 30.52 kg/m2  SpO2 98%  He looks well. Cardiac exam shows regular rate and rhythm with normal heart sounds. His lungs are clear. The right thoracotomy incision is healing well. There is an area of nontender fluctuance posterior to the thoracotomy incision that feels like a seroma. There is no erythema.  Diagnostic  Tests:  Chest x-ray today shows mild postoperative changes in the right chest. Lungs are otherwise clear. There is no pleural effusion or pneumothorax.  Impression:  Overall Vincent Short is doing well following his surgery. I suspect that the rubbing sensation and unusual noise is probably his chest wall muscles moving over top of one another. A seroma posterior to his incision may have something to do with that. I would expect this to gradually go away over the next 3-6 months. His pathology showed no evidence of cancer. It did show patchy organizing pneumonia with focal bronchiectasis with mucus plugging. This may have been related to smoking.  Plan:  I will plan to see him back in about 2 months with a repeat chest x-ray and to reevaluate the seroma.

## 2011-03-31 ENCOUNTER — Other Ambulatory Visit: Payer: Self-pay | Admitting: Surgery

## 2011-03-31 DIAGNOSIS — D381 Neoplasm of uncertain behavior of trachea, bronchus and lung: Secondary | ICD-10-CM

## 2011-04-06 DIAGNOSIS — IMO0002 Reserved for concepts with insufficient information to code with codable children: Secondary | ICD-10-CM | POA: Insufficient documentation

## 2011-04-07 ENCOUNTER — Ambulatory Visit
Admission: RE | Admit: 2011-04-07 | Discharge: 2011-04-07 | Disposition: A | Discharge status: Home / Self Care | Payer: BC Managed Care – PPO | Source: Ambulatory Visit | Attending: Surgery | Admitting: Surgery

## 2011-04-07 ENCOUNTER — Encounter: Payer: Self-pay | Admitting: Surgery

## 2011-04-07 ENCOUNTER — Ambulatory Visit (INDEPENDENT_AMBULATORY_CARE_PROVIDER_SITE_OTHER): Payer: Self-pay | Admitting: Surgery

## 2011-04-07 VITALS — BP 132/85 | HR 76 | Resp 20 | Ht 72.0 in | Wt 241.0 lb

## 2011-04-07 DIAGNOSIS — Z9889 Other specified postprocedural states: Secondary | ICD-10-CM

## 2011-04-07 DIAGNOSIS — IMO0002 Reserved for concepts with insufficient information to code with codable children: Secondary | ICD-10-CM

## 2011-04-07 DIAGNOSIS — Z902 Acquired absence of lung [part of]: Secondary | ICD-10-CM

## 2011-04-07 DIAGNOSIS — D381 Neoplasm of uncertain behavior of trachea, bronchus and lung: Secondary | ICD-10-CM

## 2011-04-07 NOTE — Progress Notes (Signed)
HPI:  The patient returns today for evaluation of a seroma that was noted posterior to his right thoracotomy incision at his initial postop visit. Since his last visit the clicking sensation that he noted when moving his arm has resolved. He said he actually pretty much forgot about the seroma because it is causing no symptoms. He has had minimal to no congestion since his surgery which was a big problem for him preoperatively. He denies any cough or shortness of breath.  Current Outpatient Prescriptions  Medication Sig Dispense Refill  . amLODipine (NORVASC) 10 MG tablet Take 10 mg by mouth daily.        Marland Kitchen atorvastatin (LIPITOR) 40 MG tablet Take 40 mg by mouth daily.        . budesonide-formoterol (SYMBICORT) 160-4.5 MCG/ACT inhaler Inhale 2 puffs into the lungs 2 (two) times daily.        Marland Kitchen losartan (COZAAR) 100 MG tablet Take 100 mg by mouth daily.        . nebivolol (BYSTOLIC) 10 MG tablet Take 10 mg by mouth daily.        . niacin (NIASPAN) 500 MG CR tablet Take 500 mg by mouth at bedtime.        Marland Kitchen PROVENTIL HFA 108 (90 BASE) MCG/ACT inhaler Inhale 2 puffs into the lungs every 6 (six) hours as needed.       . zolpidem (AMBIEN CR) 12.5 MG CR tablet Take 12.5 mg by mouth at bedtime as needed.           Physical Exam: BP 132/85  Pulse 76  Resp 20  Ht 6' (1.829 m)  Wt 241 lb (109.317 kg)  BMI 32.69 kg/m2  SpO2 95% He looks well. Lung exam is clear. The right thoracotomy incision is well-healed. The seroma posterior to the incision looks slightly smaller than before and is soft and nontender without sign of infection.  Diagnostic Tests:  *RADIOLOGY REPORT*   Clinical Data: History of lung surgery in September, follow-up   CHEST - 2 VIEW   Comparison: Chest x-ray of 02/24/2011   Findings: The right suprahilar lesion has been resected with surgical clips overlying the right suprahilar area.  No parenchymal lesion is seen currently.  No pleural effusion is noted.  The  lungs are well aerated.  Mediastinal contours are within normal limits. The heart is within normal limits in size.  No bony abnormality is seen.   IMPRESSION: Postoperative changes on the right.  No active process.   Original Report Authenticated By: Juline Patch, M.D.   Impression:  The patient continues to do well following surgery. His preoperative symptoms of cough, congestion, and shortness of breath have resolved. He continues to abstain from smoking. I told him he could return to his normal activity. I told him he did not need to return to see me unless there is some change in the seroma. I would expect this to gradually resolve over time. I encouraged him to continue abstaining from smoking.  Plan: He will return to see me as needed.

## 2011-08-31 ENCOUNTER — Ambulatory Visit: Payer: BC Managed Care – PPO | Admitting: Gastroenterology

## 2011-11-05 ENCOUNTER — Ambulatory Visit
Admission: RE | Admit: 2011-11-05 | Discharge: 2011-11-05 | Disposition: A | Discharge status: Home / Self Care | Payer: BC Managed Care – PPO | Source: Ambulatory Visit | Attending: Orthopedic Surgery | Admitting: Orthopedic Surgery

## 2011-11-05 ENCOUNTER — Other Ambulatory Visit: Payer: Self-pay | Admitting: Orthopedic Surgery

## 2011-11-05 DIAGNOSIS — T148XXA Other injury of unspecified body region, initial encounter: Secondary | ICD-10-CM

## 2012-03-12 ENCOUNTER — Emergency Department (HOSPITAL_COMMUNITY)
Admission: EM | Admit: 2012-03-12 | Discharge: 2012-03-12 | Disposition: A | Discharge status: Home / Self Care | Payer: BC Managed Care – PPO | Source: Home / Self Care | Attending: Emergency Medicine | Admitting: Emergency Medicine

## 2012-03-12 ENCOUNTER — Encounter (HOSPITAL_COMMUNITY): Payer: Self-pay | Admitting: Emergency Medicine

## 2012-03-12 DIAGNOSIS — L0390 Cellulitis, unspecified: Secondary | ICD-10-CM

## 2012-03-12 DIAGNOSIS — L0291 Cutaneous abscess, unspecified: Secondary | ICD-10-CM

## 2012-03-12 MED ORDER — SULFAMETHOXAZOLE-TMP DS 800-160 MG PO TABS: 2.0000 | ORAL_TABLET | Freq: Two times a day (BID) | ORAL | Status: DC

## 2012-03-12 MED ORDER — HYDROCODONE-ACETAMINOPHEN 5-325 MG PO TABS: ORAL_TABLET | ORAL | Status: DC

## 2012-03-12 NOTE — ED Notes (Signed)
Pt c/o poss infected abscess on left glutaeus x3/4 days... Started having chills, fevers, nauseas last night... Hurts to sit... Denies: vomiting, diarrhea... Also taking old meds that his skin doctor provided, doxycycline 100mg .... Pt is alert w/no signs of distress.

## 2012-03-12 NOTE — ED Provider Notes (Signed)
Chief Complaint  Patient presents with  . Abscess    History of Present Illness:    Vincent Short is a 61 year old male who has had a one-week history of a painful, swollen area in his left buttock. It hurts to sit on the area. It has drained a small amount of pus. He's had no fever but has had some aches and chills. He has had a small abscesses on his buttocks before, but she's always been able to manage these at home without seeking medical attention.  Review of Systems:  Other than noted above, the patient denies any of the following symptoms: Systemic:  No fever, chills or sweats. Skin:  No rash or itching.  PMFSH:  Past medical history, family history, social history, meds, and allergies were reviewed.  No history of diabetes or prior history of abscesses or MRSA.  Physical Exam:   Vital signs:  BP 133/67  Pulse 116  Resp 22  SpO2 94% Skin:  There is an elongated area of erythema, swelling, and induration adjacent to the anus. It was difficult to say whether there was a collection of pus in this area and there was no drainage.  Skin exam was otherwise normal.  No rash. Ext:  Distal pulses were full, patient has full ROM of all joints.    Procedure:  Verbal informed consent was obtained.  The patient was informed of the risks and benefits of the procedure and understands and accepts.  Identity of the patient was verified verbally and by wristband.   The abscess area described above was prepped with alcohol and anesthetized with 3 mL of 2% Xylocaine with epinephrine.  Using a #11 scalpel blade, a singe straight incision was made into the area of fluctulence, yielding no prurulent drainage.  Routine cultures were obtained.  Blunt dissection was used to break up loculations and the resulting wound cavity was packed with Xeroform gauze since he is allergic to iodine.  A sterile pressure dressing was applied.  Assessment:  The encounter diagnosis was Abscess.  Plan:   1.  The following meds  were prescribed:   New Prescriptions   HYDROCODONE-ACETAMINOPHEN (NORCO/VICODIN) 5-325 MG PER TABLET    1 to 2 tabs every 4 to 6 hours as needed for pain.   SULFAMETHOXAZOLE-TRIMETHOPRIM (BACTRIM DS) 800-160 MG PER TABLET    Take 2 tablets by mouth 2 (two) times daily.   2.  The patient was instructed in symptomatic care and handouts were given. 3.  The patient was instructed to leave the dressing in place and return again in 48 hours for packing removal.   Reuben Likes, MD 03/12/12 [phone]

## 2012-03-14 ENCOUNTER — Encounter (HOSPITAL_COMMUNITY): Payer: Self-pay | Admitting: *Deleted

## 2012-03-14 ENCOUNTER — Emergency Department (INDEPENDENT_AMBULATORY_CARE_PROVIDER_SITE_OTHER)
Admission: EM | Admit: 2012-03-14 | Discharge: 2012-03-14 | Disposition: A | Discharge status: Home / Self Care | Payer: BC Managed Care – PPO | Source: Home / Self Care | Attending: Emergency Medicine | Admitting: Emergency Medicine

## 2012-03-14 DIAGNOSIS — L0231 Cutaneous abscess of buttock: Secondary | ICD-10-CM

## 2012-03-14 DIAGNOSIS — Z5189 Encounter for other specified aftercare: Secondary | ICD-10-CM

## 2012-03-14 NOTE — ED Notes (Signed)
Pt reports that he is here for wound recheck - still having pain, recurrent fevers, chills , cold sweats, " I feel miserable - I'm not eating, I'm not going to the bathroom"

## 2012-03-14 NOTE — ED Provider Notes (Signed)
History     CSN: [id number]  Arrival date & time 03/14/12  [id number]   First MD Initiated Contact with Patient 03/14/12 [phone]      Chief Complaint  Patient presents with  . Wound Check    (Consider location/radiation/quality/duration/timing/severity/associated sxs/prior treatment) HPI Comments: 61 year old male that is returning today status post incision and drainage of the left buttock abscess. He states it still hurts he is also having some bodyaches and headaches. Occasionally fever. The packing was accidentally removed when they were trying to readjust the dressing. The wound however is healing nicely. The incision is still open but no purulence or other fluid is expressed. There is no longer induration or erythema. He states he has taken 3 doses of the Septra. Checked the culture report there is a preliminary report is showing no growth or organism identity at this time.   Past Medical History  Diagnosis Date  . HTN (hypertension)   . Hyperlipidemia   . Tobacco abuse   . COPD (chronic obstructive pulmonary disease)   . Lung mass     noted on CT scan(12/24/10)   . Allergic rhinitis   . Asthma   . Atelectasis     Rt upper lobe  . S/P cardiac cath     negative  . Treadmill stress test negative for angina pectoris     Dr. Tresa Endo  . GERD (gastroesophageal reflux disease)     Past Surgical History  Procedure Date  . Bronchoscopy with airway inspection, brush cytology, 11/22/2006    Dr Craige Cotta  . Left ankle surgery 11/11/2005    Dr Thurston Hole  . Arthroscopy left wrist with radial styloidectomy. 07/06/2000    Dr Merlyn Lot  . Cardiac catheterization 07/05/2002    Dr Tresa Endo  . Tonsillectomy   . Nasal sinus surgery 1990  . Fracture surgery     Left wrist scaphoid fx  . Fracture surgery     Left ankle surgery  . Rotator cuff repair pending    Dr. Thurston Hole  . Lung surgery 01/21/11   Dr. Laneta Simmers    FOB, R THORACOTOMY WITH RIGHT UPPER LOBECTOMY  . Lung surgery 01/21/11    FOB, R  THORACOTOMY WITH RIGHT UPPER LOBECTOMY    Family History  Problem Relation Age of Onset  . Coronary artery disease Father   . Allergic rhinitis Father   . Coronary artery disease Mother   . Allergic rhinitis Mother   . Heart disease Mother     History  Substance Use Topics  . Smoking status: Former Smoker    Types: Cigarettes    Quit date: 12/29/2010  . Smokeless tobacco: Never Used  . Alcohol Use: Yes      Review of Systems  Constitutional: Positive for fever, activity change and fatigue.  HENT: Negative.   All other systems reviewed and are negative.    Allergies  Contrast media  Home Medications   Current Outpatient Rx  Name  Route  Sig  Dispense  Refill  . AMLODIPINE BESYLATE 10 MG PO TABS   Oral   Take 10 mg by mouth daily.           . ATORVASTATIN CALCIUM 40 MG PO TABS   Oral   Take 40 mg by mouth daily.           . BUDESONIDE-FORMOTEROL FUMARATE 160-4.5 MCG/ACT IN AERO   Inhalation   Inhale 2 puffs into the lungs 2 (two) times daily.           Marland Kitchen  HYDROCODONE-ACETAMINOPHEN 5-325 MG PO TABS      1 to 2 tabs every 4 to 6 hours as needed for pain.   20 tablet   0   . LOSARTAN POTASSIUM 100 MG PO TABS   Oral   Take 100 mg by mouth daily.           . NEBIVOLOL HCL 10 MG PO TABS   Oral   Take 10 mg by mouth daily.           Marland Kitchen NIACIN ER (ANTIHYPERLIPIDEMIC) 500 MG PO TBCR   Oral   Take 500 mg by mouth at bedtime.           Marland Kitchen PROVENTIL HFA 108 (90 BASE) MCG/ACT IN AERS   Inhalation   Inhale 2 puffs into the lungs every 6 (six) hours as needed.          . SULFAMETHOXAZOLE-TMP DS 800-160 MG PO TABS   Oral   Take 2 tablets by mouth 2 (two) times daily.   40 tablet   0   . ZOLPIDEM TARTRATE ER 12.5 MG PO TBCR   Oral   Take 12.5 mg by mouth at bedtime as needed.             BP 94/59  Pulse 101  Temp 98.4 F (36.9 C) (Oral)  Resp 22  SpO2 95%  Physical Exam  ED Course  Procedures (including critical care time)  Labs  Reviewed - No data to display No results found.   1. Cellulitis and abscess of buttock       MDM  Continue taking and take all of the Septra that was prescribed days ago. Keep the wound clean may shower and then with the water route. Do not put any ointment on the wound. He may clean to help the intact skin around the wound with alcohol and keep hands washed often. He may have an additional/coincidental viral illness that is causing bodyaches headaches and fever. Take Tylenol every 4 hours as needed but for any worsening new symptoms or problems or spiking higher fevers go to the emergency department. I do not believe this is due to the wound as is healing quite nicely there is no drainage from the wound no erythema or induration.        Hayden Rasmussen, NP 03/14/12 [phone]

## 2012-03-14 NOTE — ED Provider Notes (Signed)
Medical screening examination/treatment/procedure(s) were performed by non-physician practitioner and as supervising physician I was immediately available for consultation/collaboration.  Leslee Home, M.D.   Reuben Likes, MD 03/14/12 [phone]

## 2012-03-16 LAB — CULTURE, ROUTINE-ABSCESS

## 2012-07-12 ENCOUNTER — Telehealth: Payer: Self-pay | Admitting: Gastroenterology

## 2012-07-12 ENCOUNTER — Encounter: Payer: Self-pay | Admitting: *Deleted

## 2012-07-12 NOTE — Telephone Encounter (Signed)
Pt states he has been having some rectal bleeding, states it is rather thick and it separates from the toilet bowl water. Pt requesting to be seen. Pt scheduled to see Mike Gip PA tomorrow at 9:30am. Pt aware of appt date and time.

## 2012-07-13 ENCOUNTER — Ambulatory Visit: Payer: BC Managed Care – PPO | Admitting: Physician Assistant

## 2012-07-13 ENCOUNTER — Encounter: Payer: Self-pay | Admitting: Family Medicine

## 2012-07-13 ENCOUNTER — Ambulatory Visit (INDEPENDENT_AMBULATORY_CARE_PROVIDER_SITE_OTHER): Payer: BC Managed Care – PPO | Admitting: Family Medicine

## 2012-07-13 VITALS — BP 110/70 | HR 78 | Temp 98.4°F | Wt 242.0 lb

## 2012-07-13 DIAGNOSIS — E785 Hyperlipidemia, unspecified: Secondary | ICD-10-CM | POA: Insufficient documentation

## 2012-07-13 DIAGNOSIS — K603 Anal fistula: Secondary | ICD-10-CM

## 2012-07-13 DIAGNOSIS — J209 Acute bronchitis, unspecified: Secondary | ICD-10-CM

## 2012-07-13 MED ORDER — LEVOFLOXACIN 500 MG PO TABS: 500.0000 mg | ORAL_TABLET | Freq: Every day | ORAL | Status: DC

## 2012-07-13 NOTE — Progress Notes (Signed)
Subjective:     Patient ID: Vincent Short, male   DOB: Oct 30, 1950, 62 y.o.   MRN: [id number]  HPI  #1  patient originally presented in November of 2013 with what looked to be a perianal abscess he underwent incision and drainage and treatment with clindamycin and Bactrim.  The abscess largely improved however the I&D site never completely healed.  Now for a month later there continues to be an opening that allows bloody mucous-like drainage on a daily basis.  The patient reports he'll occasionally scab up and become fluctuant mass.  He rubs the scab off, it would drain purulent material for a day or 2, shrink in size, but the persistent opening will remain. #2  1 week of cough productive of clear mucus, fever sinus pressure sinus drainage postnasal drip.  He reports worsening chest tightness and slightly increasing shortness of breath.  He has a history of right upper lobe resection due to atelectasis and scar tissue.  He is quit smoking.  He tried Mucinex and cold medicines over-the-counter without relief. Review of Systems  Constitutional: Positive for fever, chills and fatigue.  HENT: Positive for congestion, rhinorrhea and postnasal drip. Negative for ear pain, nosebleeds, neck pain, neck stiffness and ear discharge.   Eyes: Negative.   Respiratory: Positive for cough, chest tightness, shortness of breath and wheezing.   Cardiovascular: Negative for chest pain.       Objective:   Physical Exam  Constitutional: He appears well-developed and well-nourished.  HENT:  Head: Normocephalic and atraumatic.  Right Ear: External ear normal.  Left Ear: External ear normal.  Mouth/Throat: Oropharynx is clear and moist.  Eyes: Conjunctivae and EOM are normal. Pupils are equal, round, and reactive to light.  Neck: Neck supple.  Cardiovascular: Normal rate, regular rhythm and normal heart sounds.   No murmur heard. Pulmonary/Chest: No respiratory distress. He has wheezes. He has no rales.    Rectal exam reveals a 5 mm opening, from which him able to express a bloody mucous-like discharge.  Is also surrounding erythema lacking warmth or tenderness.  There is no evidence of fluctuance or abscess.  This is concerning for a perianal fistula     Assessment:    #1 bronchitis #2 perianal fistula    Plan:    #1 Levaquin 500 mg by mouth daily for 7 days,  have also advised Mucinex 400 mg by mouth every 4 hours when necessary cough. #2 due to the persistence of this opening, patient has a perianal fistula and would benefit from Gen. surgical evaluation and possible excision.  I will arrange that consultation.

## 2012-07-16 IMAGING — CR DG CHEST 2V
2 series · 2 of 2 positions shown · non-contrast
Comparison: 01/05/2011.  12/23/2010.  08/06/2008.

CLINICAL DATA: Right upper lobe mass.  Pre bronchoscopy evaluation.
Smoking history.  Hypertension.

CHEST - 2 VIEW

[view not recorded (1 of 2)]
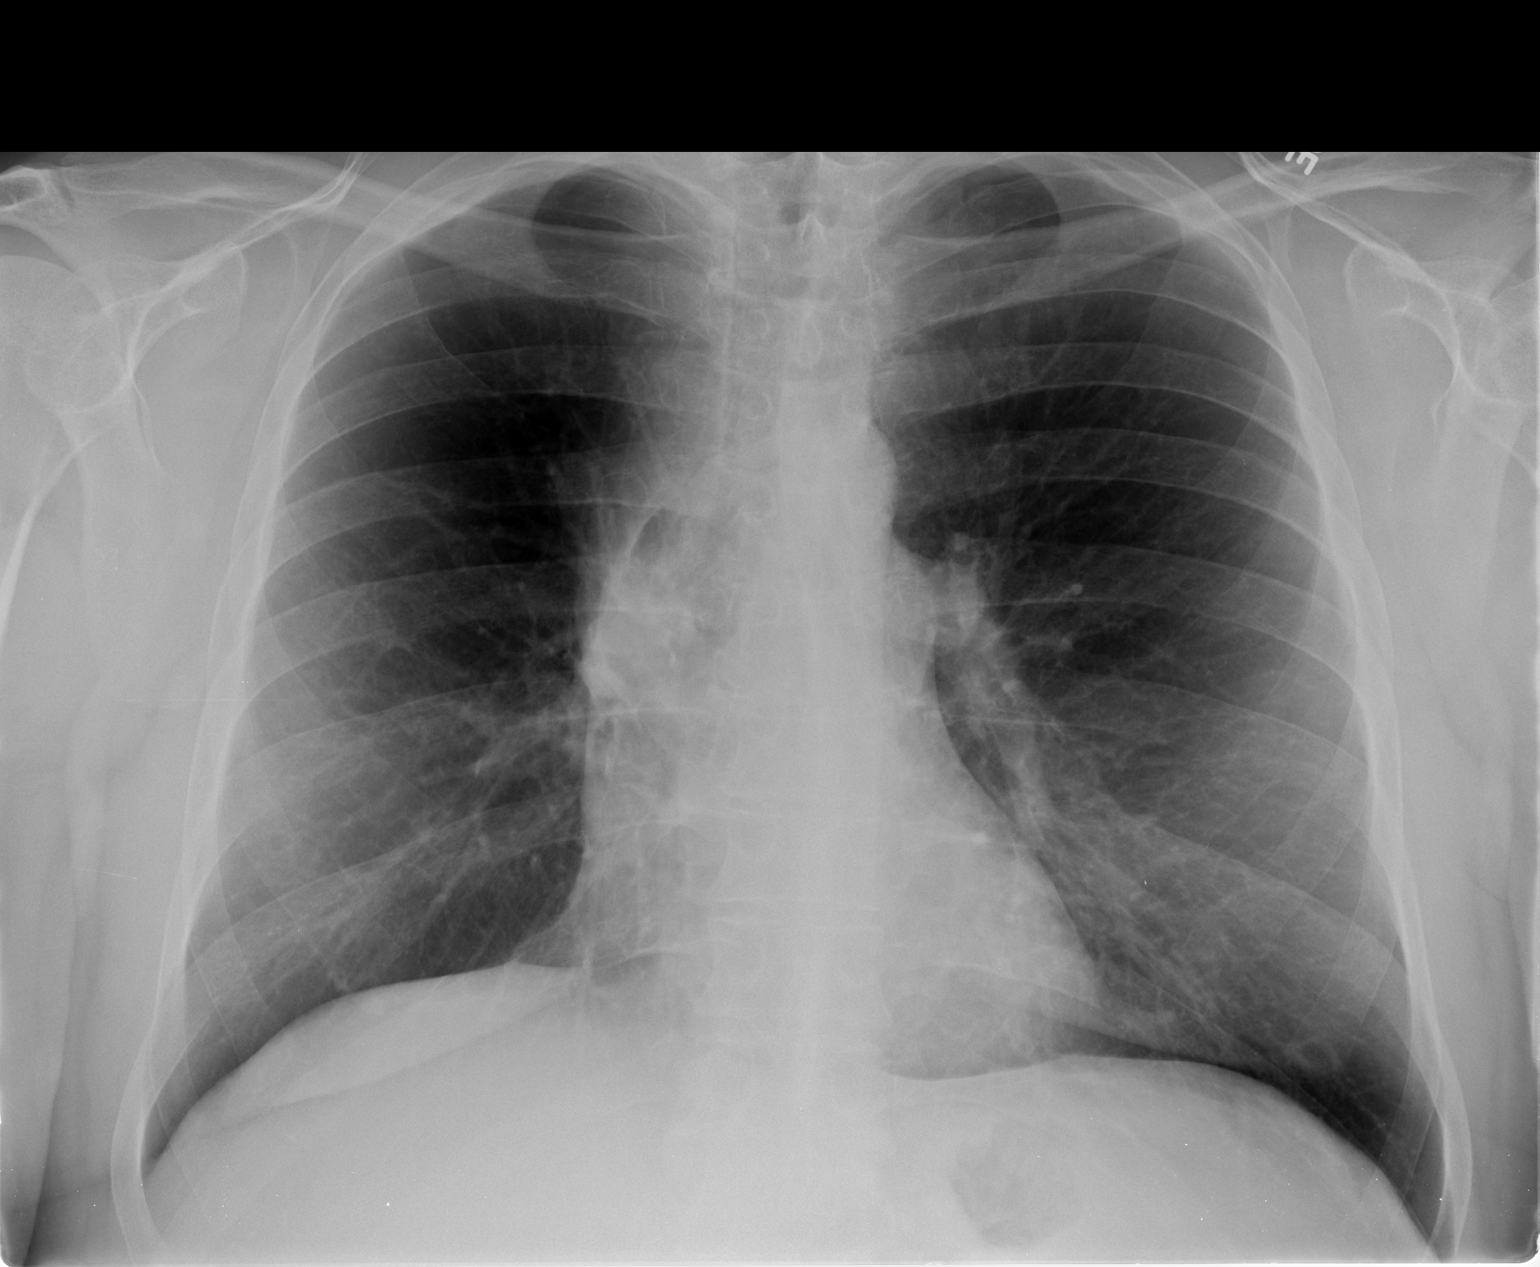

[view not recorded (2 of 2)]
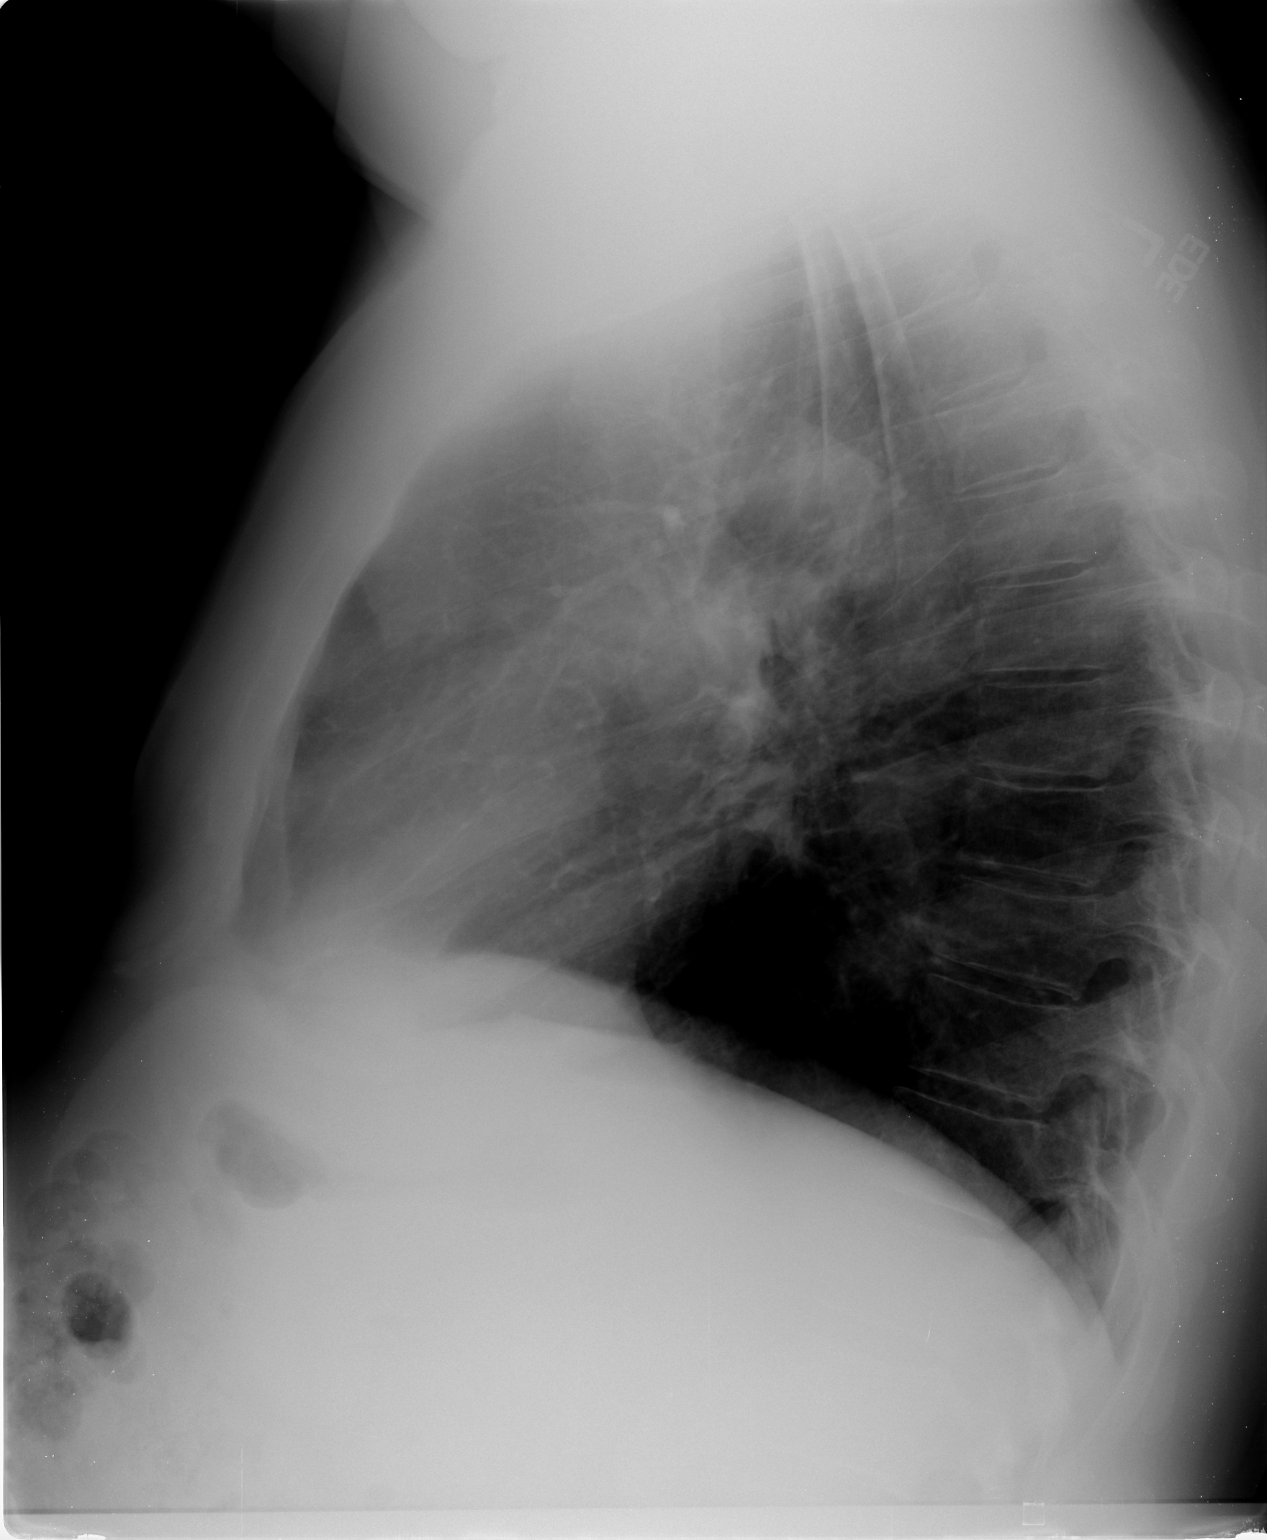

[2 of 2 positions shown; findings below may reference images not displayed]

FINDINGS: Left chest is clear.There does still appear to be some
volume loss in the right upper lobe with partial collapse medially.
I think there is better aeration than was seen previously. Fullness
of the right hilum persists.  No effusions.  No significant bony
finding.
IMPRESSION: Some improvement in aeration of the right upper lobe with
persistent to partial volume loss and right hilar prominence.

## 2012-07-18 IMAGING — CR DG CHEST 1V PORT
1 series · 1 of 1 positions shown · non-contrast
Comparison: Chest x-ray 01/07/2011.

CLINICAL DATA: Postop video bronchoscopy.

PORTABLE CHEST - 1 VIEW

[AP]
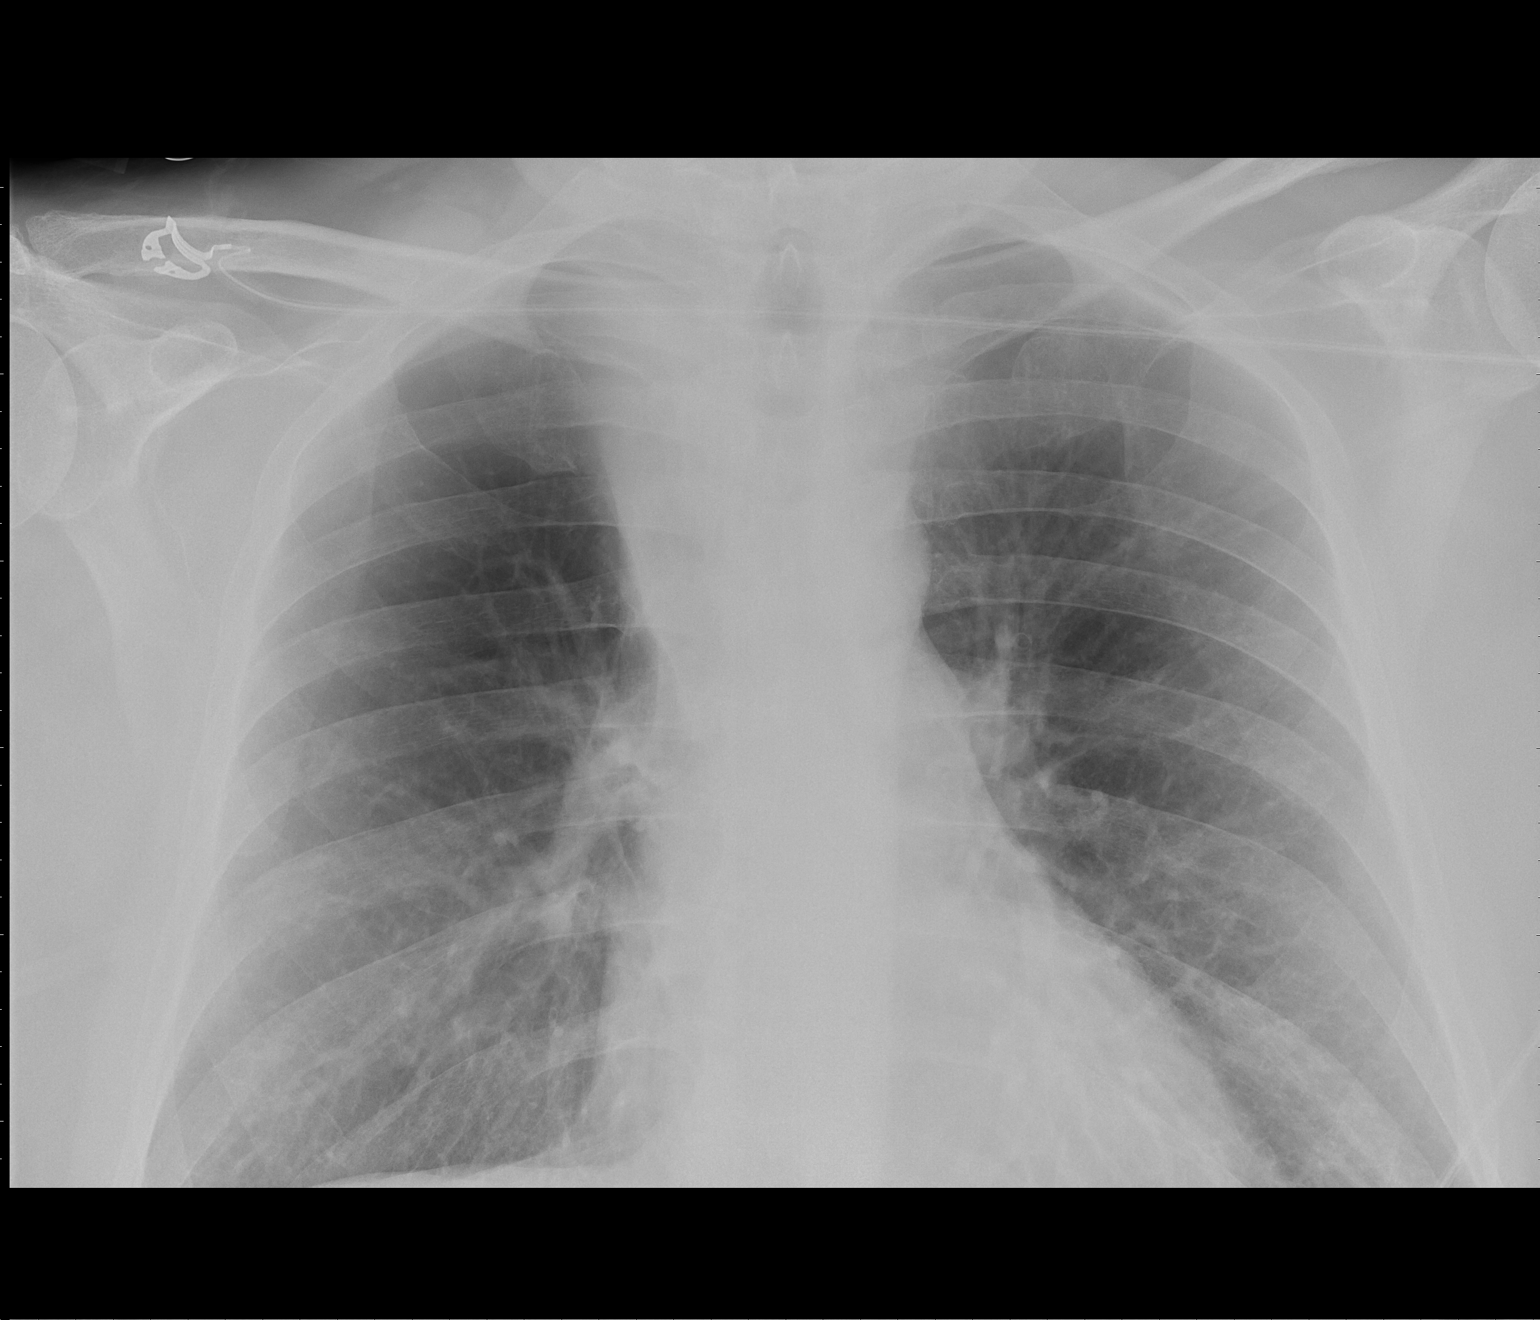

[1 of 1 positions shown; findings below may reference images not displayed]

FINDINGS: No pneumothorax.  Worsening aeration with increasing
right paratracheal density likely representing atelectasis of the
azygos lobe. Worsening interval change from radiograph 2 days ago.
Right hilar fullness difficult to appreciate.  Left lung clear.
IMPRESSION: Increasing right paratracheal density likely representing azygos
lobe atelectasis. No pneumothorax status post bronchoscopy.

## 2012-07-25 ENCOUNTER — Ambulatory Visit (INDEPENDENT_AMBULATORY_CARE_PROVIDER_SITE_OTHER): Payer: BC Managed Care – PPO | Admitting: Surgery

## 2012-07-25 ENCOUNTER — Encounter (INDEPENDENT_AMBULATORY_CARE_PROVIDER_SITE_OTHER): Payer: Self-pay | Admitting: Surgery

## 2012-07-25 VITALS — BP 114/76 | HR 68 | Temp 97.6°F | Resp 20 | Ht 72.0 in | Wt 241.0 lb

## 2012-07-25 DIAGNOSIS — K603 Anal fistula, unspecified: Secondary | ICD-10-CM

## 2012-07-25 NOTE — Progress Notes (Signed)
Patient ID: Vincent Short, male   DOB: 05/06/1950, 62 y.o.   MRN: [id number]  Chief Complaint  Patient presents with  . New Evaluation    eval perianal fistula    HPI Vincent Short is a 62 y.o. male.  Referred by Dr. Gilmore Laroche for evaluation of anal fistula HPI This is a 62 year old male who presents after having a perirectal abscess drained in November of 2013. This area has never completely healed. He continues to have a small opening which occasionally forms a scab but then spontaneously began straining. His main complaint is bloody drainage with his bowel movements. The patient states that in the past he has had blood with his bowel movements that was actually more persistent. Currently his bloody drainage with bowel movements occurs only at the time of bowel movements. He denies any pain associated with this. He occasionally has some purulent drainage from the opening of his perirectal abscess his last colonoscopy was within the last 7-8 years. Dr. 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    The patient does have a history of colon polyps.  He reports no constipation.   Past Medical History  Diagnosis Date  . HTN (hypertension)   . Hyperlipidemia   . Tobacco abuse   . COPD (chronic obstructive pulmonary disease)   . Lung mass     noted on CT scan(12/24/10)   . Allergic rhinitis   . Asthma   . Atelectasis     Rt upper lobe  . S/P cardiac cath     negative  . Treadmill stress test negative for angina pectoris     Dr. Tresa Endo  . GERD (gastroesophageal reflux disease)   . History of colon polyps 03/14/2009    Tubular adenomatous and Hyperplastic polyps    Past Surgical History  Procedure Laterality Date  . Bronchoscopy with airway inspection, brush cytology,  11/22/2006    Dr Craige Cotta  . Left ankle surgery  11/11/2005    Dr Thurston Hole  . Arthroscopy left wrist with radial styloidectomy.  07/06/2000    Dr Merlyn Lot  . Cardiac catheterization   07/05/2002    Dr Tresa Endo  . Tonsillectomy    . Nasal sinus surgery  1990  . Fracture surgery      Left wrist scaphoid fx  . Fracture surgery      Left ankle surgery  . Rotator cuff repair  pending    Dr. Thurston Hole  . Lung surgery  01/21/11   Dr. Laneta Simmers    FOB, R THORACOTOMY WITH RIGHT UPPER LOBECTOMY  . Lung surgery  01/21/11    FOB, R THORACOTOMY WITH RIGHT UPPER LOBECTOMY    Family History  Problem Relation Age of Onset  . Coronary artery disease Father   . Allergic rhinitis Father   . Heart disease Father   . Coronary artery disease Mother   . Allergic rhinitis Mother   . Heart disease Mother   . Kidney disease Mother   . Stroke Mother     Social History History  Substance Use Topics  . Smoking status: Former Smoker    Types: Cigarettes    Quit date: 12/29/2010  . Smokeless tobacco: Never Used  . Alcohol Use: Yes    Allergies  Allergen Reactions  . Contrast Media (Iodinated Diagnostic Agents) Swelling    Allergic reaction 1987, ok w/ 13 hr prep//a.c.    Current Outpatient Prescriptions  Medication Sig Dispense Refill  . amLODipine (NORVASC) 10 MG tablet  Take 10 mg by mouth daily.        Marland Kitchen atorvastatin (LIPITOR) 40 MG tablet Take 40 mg by mouth daily.        . budesonide-formoterol (SYMBICORT) 160-4.5 MCG/ACT inhaler Inhale 2 puffs into the lungs 2 (two) times daily.        . cetirizine (ZYRTEC) 10 MG tablet Take 10 mg by mouth daily.      . fluticasone (FLONASE) 50 MCG/ACT nasal spray Place 2 sprays into the nose daily.      Marland Kitchen losartan (COZAAR) 100 MG tablet Take 100 mg by mouth daily.        . nebivolol (BYSTOLIC) 10 MG tablet Take 10 mg by mouth daily.        . niacin (NIASPAN) 500 MG CR tablet Take 500 mg by mouth at bedtime.        . Probiotic Product (ALIGN PO) Take by mouth.      Marland Kitchen ZETIA 10 MG tablet        No current facility-administered medications for this visit.    Review of Systems Review of Systems  Constitutional: Negative for fever, chills and  unexpected weight change.  HENT: Negative for hearing loss, congestion, sore throat, trouble swallowing and voice change.   Eyes: Negative for visual disturbance.  Respiratory: Negative for cough and wheezing.   Cardiovascular: Negative for chest pain, palpitations and leg swelling.  Gastrointestinal: Positive for abdominal pain and blood in stool. Negative for nausea, vomiting, diarrhea, constipation, abdominal distention, anal bleeding and rectal pain.  Genitourinary: Negative for hematuria and difficulty urinating.  Musculoskeletal: Negative for arthralgias.  Skin: Negative for rash and wound.  Neurological: Negative for seizures, syncope, weakness and headaches.  Hematological: Negative for adenopathy. Does not bruise/bleed easily.  Psychiatric/Behavioral: Negative for confusion.    Blood pressure 114/76, pulse 68, temperature 97.6 F (36.4 C), temperature source Temporal, resp. rate 20, height 6' (1.829 m), weight 241 lb (109.317 kg).  Physical Exam Physical Exam WDWN in NAD Rectal - chronic opening about 4 cm from anal verge with some drainage; no obvious abscess; minimal hemorrhoidal disease Data Reviewed none  Assessment     Anal fistula Melena     Plan    Recommend examination under anesthesia with possible fistulotomy/ hemorrhoidectomy.  If he continues to have bloody bowel movements, he will need a colonoscopy.  The surgical procedure has been discussed with the patient.  Potential risks, benefits, alternative treatments, and expected outcomes have been explained.  All of the patient's questions at this time have been answered.  The likelihood of reaching the patient's treatment goal is good.  The patient understand the proposed surgical procedure and wishes to proceed.         Oddis Westling K. 07/25/2012, 5:31 PM

## 2012-08-01 ENCOUNTER — Telehealth: Payer: Self-pay | Admitting: Family Medicine

## 2012-08-01 IMAGING — CR DG CHEST 1V PORT
1 series · 1 of 1 positions shown · non-contrast
Comparison: 01/22/2011

CLINICAL DATA: Postop procedure thoracotomy

PORTABLE CHEST - 1 VIEW

[AP]
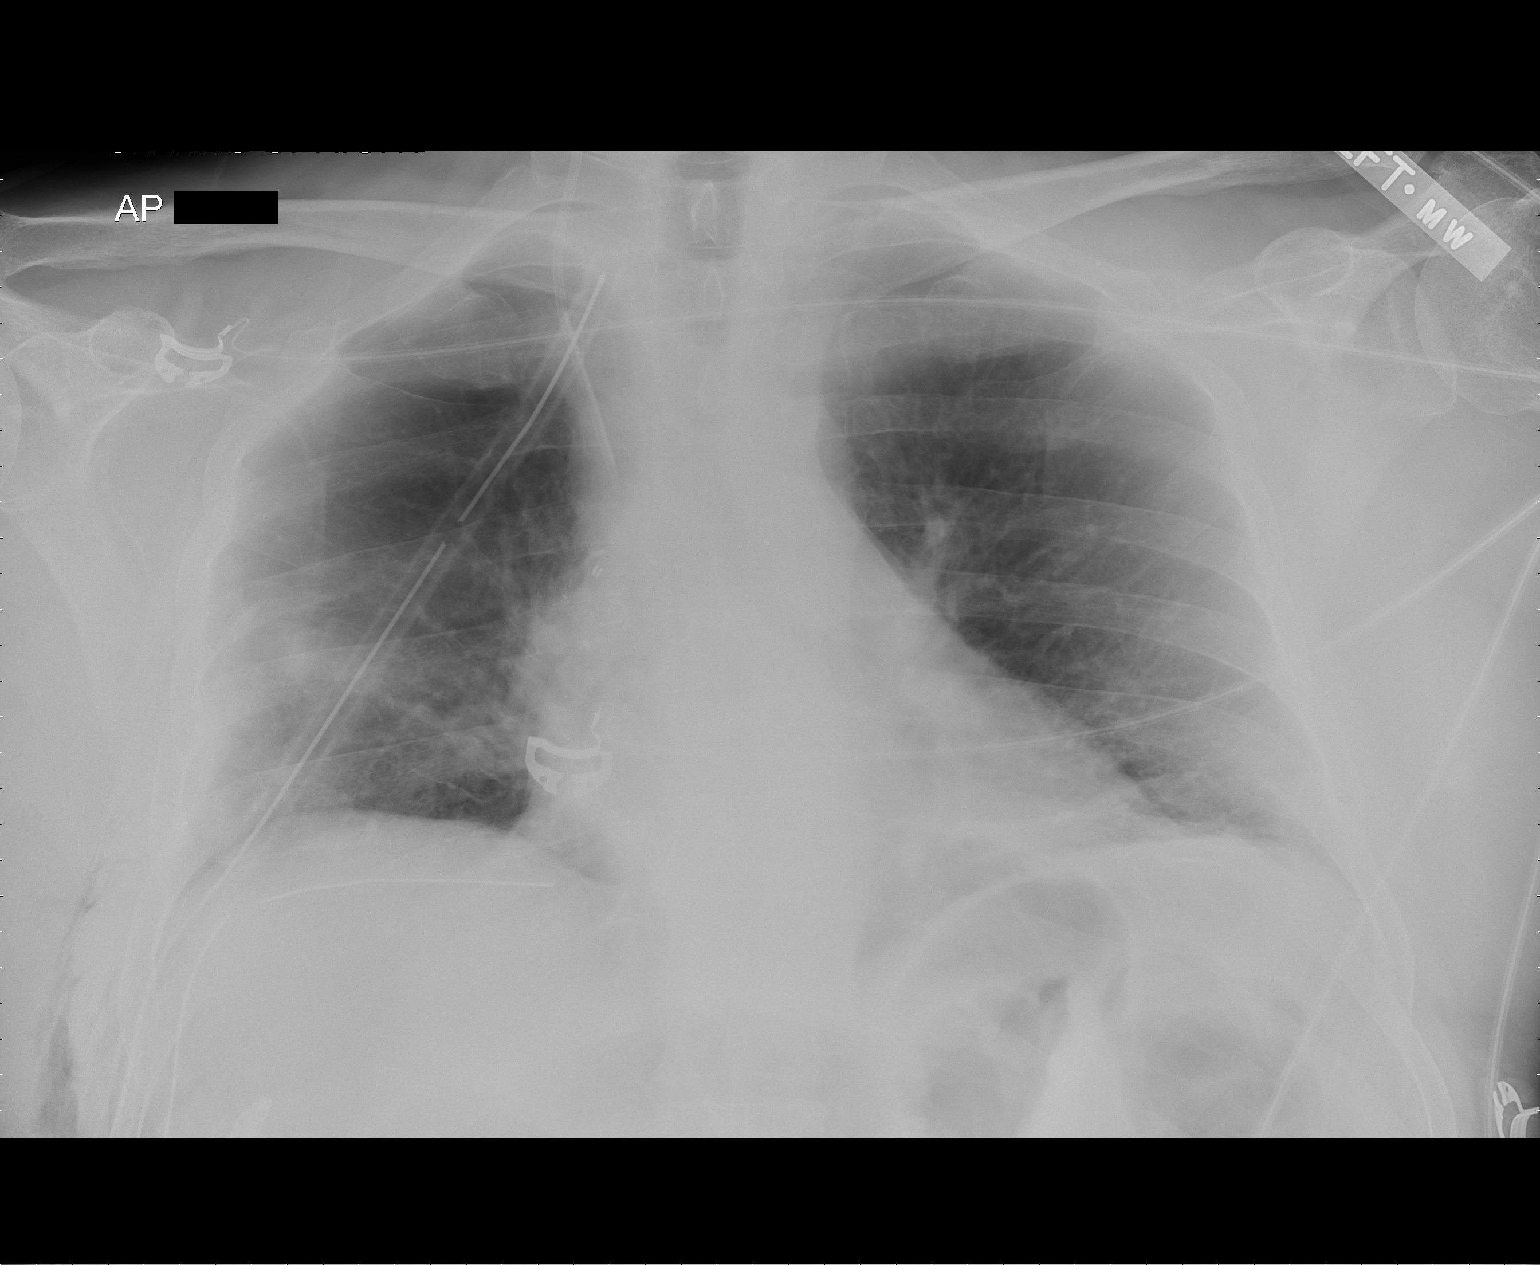

[1 of 1 positions shown; findings below may reference images not displayed]

FINDINGS: Right internal jugular catheter unchanged.  Stable
cardiac silhouette.  There are two right chest tube in place.
Small right apical lateral pneumothorax is unchanged.  There is
volume loss in the right hemithorax and lower lobe air space
disease.  Small amount of subcutaneous gas along the right chest
wall.
IMPRESSION: 1.  No interval change.
2.  Small apical lateral right pneumothorax with two chest tubes in
place.
3.  Bibasilar atelectasis.

## 2012-08-01 MED ORDER — NEBIVOLOL HCL 10 MG PO TABS: 10.0000 mg | ORAL_TABLET | Freq: Every day | ORAL | Status: DC

## 2012-08-01 NOTE — Telephone Encounter (Signed)
rx refilled.

## 2012-08-03 IMAGING — CR DG CHEST 1V PORT
1 series · 1 of 1 positions shown · non-contrast
Comparison: 6216 hours

CLINICAL DATA: chest tube removal

PORTABLE CHEST - 1 VIEW

[AP]
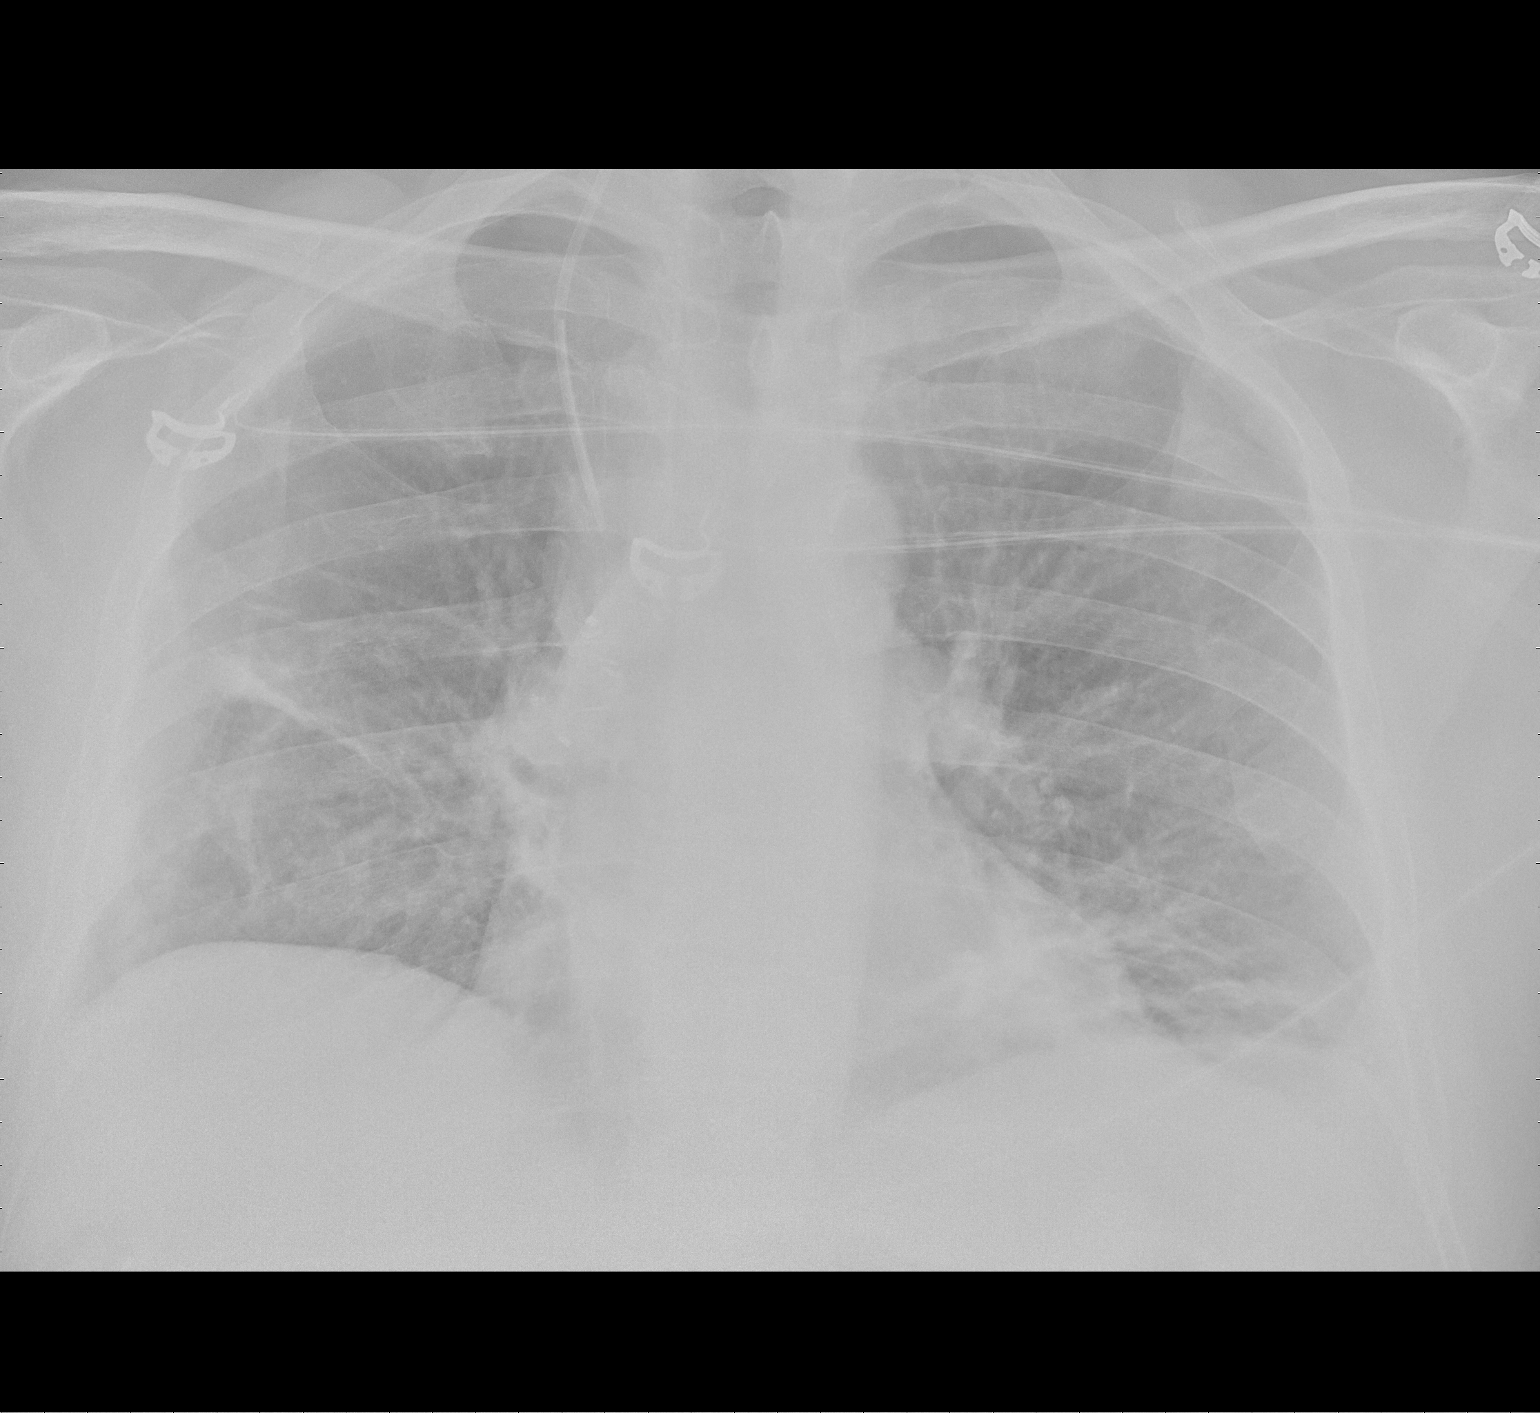

[1 of 1 positions shown; findings below may reference images not displayed]

FINDINGS: Right chest tube removed.  Stable tiny right apical
pneumothorax.  Central venous catheter unchanged.  Stable bibasilar
atelectasis.
IMPRESSION: Right chest tube removed.  Stable tiny right apical pneumothorax.
Stable bibasilar atelectasis.

## 2012-08-17 ENCOUNTER — Ambulatory Visit (HOSPITAL_BASED_OUTPATIENT_CLINIC_OR_DEPARTMENT_OTHER): Admission: RE | Admit: 2012-08-17 | Payer: BC Managed Care – PPO | Source: Ambulatory Visit | Admitting: Surgery

## 2012-08-17 ENCOUNTER — Encounter (HOSPITAL_BASED_OUTPATIENT_CLINIC_OR_DEPARTMENT_OTHER): Admission: RE | Payer: Self-pay | Source: Ambulatory Visit

## 2012-08-17 SURGERY — EXAM UNDER ANESTHESIA WITH ANAL FISTULECTOMY
Anesthesia: General

## 2012-09-06 ENCOUNTER — Encounter (INDEPENDENT_AMBULATORY_CARE_PROVIDER_SITE_OTHER): Payer: BC Managed Care – PPO | Admitting: Surgery

## 2012-10-04 ENCOUNTER — Other Ambulatory Visit: Payer: Self-pay | Admitting: Family Medicine

## 2012-10-04 MED ORDER — FLUTICASONE PROPIONATE 50 MCG/ACT NA SUSP: 2.0000 | Freq: Every day | NASAL | Status: DC

## 2012-11-10 ENCOUNTER — Telehealth (INDEPENDENT_AMBULATORY_CARE_PROVIDER_SITE_OTHER): Payer: Self-pay | Admitting: Surgery

## 2012-11-10 NOTE — Telephone Encounter (Signed)
Called patient to see if still wanted surgery, left voice message to advise to call back as will need to be seen again.

## 2012-12-30 ENCOUNTER — Encounter: Payer: Self-pay | Admitting: Family Medicine

## 2012-12-30 ENCOUNTER — Ambulatory Visit (INDEPENDENT_AMBULATORY_CARE_PROVIDER_SITE_OTHER): Payer: BC Managed Care – PPO | Admitting: Family Medicine

## 2012-12-30 VITALS — BP 110/68 | HR 68 | Temp 98.5°F | Resp 18 | Wt 247.0 lb

## 2012-12-30 DIAGNOSIS — H60399 Other infective otitis externa, unspecified ear: Secondary | ICD-10-CM

## 2012-12-30 DIAGNOSIS — H60391 Other infective otitis externa, right ear: Secondary | ICD-10-CM

## 2012-12-30 MED ORDER — NEOMYCIN-POLYMYXIN-HC 1 % OT SOLN: 3.0000 [drp] | Freq: Four times a day (QID) | OTIC | Status: DC

## 2012-12-30 NOTE — Progress Notes (Signed)
Subjective:    Patient ID: Vincent Short, male    DOB: 1950/11/05, 62 y.o.   [name]  HPI  Patient reports about a week of pain in his right ear. Ear canal feels swollen itchy and painful. He denies any drainage from the ear. He denies any sinus problems. He denies any fever. He's been putting hydrogen peroxide in his ear which has helped somewhat.  He denies any rhinorrhea sore throat or cough. Past Medical History  Diagnosis Date  . HTN (hypertension)   . Hyperlipidemia   . Tobacco abuse   . COPD (chronic obstructive pulmonary disease)   . Lung mass     noted on CT scan(12/24/10)   . Allergic rhinitis   . Asthma   . Atelectasis     Rt upper lobe  . S/P cardiac cath     negative  . Treadmill stress test negative for angina pectoris     Dr. Tresa Endo  . GERD (gastroesophageal reflux disease)   . History of colon polyps 03/14/2009    Tubular adenomatous and Hyperplastic polyps   Current Outpatient Prescriptions on File Prior to Visit  Medication Sig Dispense Refill  . amLODipine (NORVASC) 10 MG tablet Take 10 mg by mouth daily.        Marland Kitchen atorvastatin (LIPITOR) 40 MG tablet Take 40 mg by mouth daily.        . budesonide-formoterol (SYMBICORT) 160-4.5 MCG/ACT inhaler Inhale 2 puffs into the lungs 2 (two) times daily.        . cetirizine (ZYRTEC) 10 MG tablet Take 10 mg by mouth daily.      . fluticasone (FLONASE) 50 MCG/ACT nasal spray Place 2 sprays into the nose daily.  16 g  11  . losartan (COZAAR) 100 MG tablet Take 100 mg by mouth daily.        . nebivolol (BYSTOLIC) 10 MG tablet Take 1 tablet (10 mg total) by mouth daily.  30 tablet  3  . niacin (NIASPAN) 500 MG CR tablet Take 500 mg by mouth at bedtime.        . Probiotic Product (ALIGN PO) Take by mouth.      Marland Kitchen ZETIA 10 MG tablet        No current facility-administered medications on file prior to visit.   Allergies  Allergen Reactions  . Contrast Media [Iodinated Diagnostic Agents] Swelling    Allergic  reaction 1987, ok w/ 13 hr prep//a.c.   History   Social History  . Marital Status: Married    Spouse Name: N/A    Number of Children: N/A  . Years of Education: N/A   Occupational History  . heating and AC    Social History Main Topics  . Smoking status: Former Smoker    Types: Cigarettes    Quit date: 12/29/2010  . Smokeless tobacco: Never Used  . Alcohol Use: Yes  . Drug Use: No  . Sexual Activity: Not on file   Other Topics Concern  . Not on file   Social History Narrative    He is married.  He works in Production assistant, radio.  He smoked 1 pack of cigarettes a day for the last 35     years.  He has occasional alcohol use.     Review of Systems  All other systems reviewed and are negative.       Objective:   Physical Exam  Vitals reviewed. Constitutional: He appears well-developed and well-nourished.  HENT:  Right Ear: There is drainage and swelling. No mastoid tenderness. Tympanic membrane is not injected, not scarred, not perforated, not erythematous, not retracted and not bulging. Tympanic membrane mobility is normal. No middle ear effusion.  Left Ear: External ear normal.  Nose: Nose normal.  Mouth/Throat: Oropharynx is clear and moist. No oropharyngeal exudate.  Cardiovascular: Normal rate and regular rhythm.   Pulmonary/Chest: Effort normal and breath sounds normal.   right external auditory canal is swollen erythematous with copious exudate.        Assessment & Plan:  1. Otitis, externa, infective, right Beginning cortisporin HC otic 3 drops in the right ear canal 4 times a day for 7-10 days - NEOMYCIN-POLYMYXIN-HYDROCORTISONE (CORTISPORIN) 1 % SOLN otic solution; Place 3 drops into the right ear every 6 (six) hours.  Dispense: 10 mL; Refill: 1

## 2013-01-02 ENCOUNTER — Other Ambulatory Visit: Payer: Self-pay | Admitting: *Deleted

## 2013-01-02 MED ORDER — NIACIN ER (ANTIHYPERLIPIDEMIC) 1000 MG PO TBCR: 1000.0000 mg | EXTENDED_RELEASE_TABLET | Freq: Every day | ORAL | Status: DC

## 2013-01-02 NOTE — Telephone Encounter (Signed)
Rx was sent to pharmacy electronically. 

## 2013-02-09 ENCOUNTER — Telehealth: Payer: Self-pay | Admitting: Family Medicine

## 2013-02-09 ENCOUNTER — Other Ambulatory Visit: Payer: Self-pay | Admitting: Family Medicine

## 2013-02-09 MED ORDER — ATORVASTATIN CALCIUM 40 MG PO TABS: 40.0000 mg | ORAL_TABLET | Freq: Every day | ORAL | Status: DC

## 2013-02-09 NOTE — Telephone Encounter (Signed)
Needs Atorvastatin 40 mgs and Zetia 10mg s called in

## 2013-02-09 NOTE — Telephone Encounter (Signed)
Meds refilled.

## 2013-02-10 ENCOUNTER — Telehealth: Payer: Self-pay | Admitting: Family Medicine

## 2013-02-10 MED ORDER — NEBIVOLOL HCL 10 MG PO TABS: 10.0000 mg | ORAL_TABLET | Freq: Every day | ORAL | Status: DC

## 2013-02-10 NOTE — Telephone Encounter (Signed)
Bystolic 10 mg

## 2013-02-13 ENCOUNTER — Other Ambulatory Visit: Payer: Self-pay | Admitting: *Deleted

## 2013-02-13 MED ORDER — AMLODIPINE BESYLATE 10 MG PO TABS: 10.0000 mg | ORAL_TABLET | Freq: Every day | ORAL | Status: DC

## 2013-02-13 NOTE — Telephone Encounter (Signed)
Rx was sent to pharmacy electronically. 

## 2013-02-17 ENCOUNTER — Other Ambulatory Visit: Payer: Self-pay | Admitting: Family Medicine

## 2013-02-17 ENCOUNTER — Encounter: Payer: Self-pay | Admitting: Family Medicine

## 2013-02-17 MED ORDER — NEBIVOLOL HCL 10 MG PO TABS: 10.0000 mg | ORAL_TABLET | Freq: Every day | ORAL | Status: DC

## 2013-02-17 NOTE — Telephone Encounter (Signed)
Bystolic was just refilled.  Pt NTBS for routine OV/Labs  Letter sent

## 2013-03-21 ENCOUNTER — Ambulatory Visit (INDEPENDENT_AMBULATORY_CARE_PROVIDER_SITE_OTHER): Payer: BC Managed Care – PPO | Admitting: Family Medicine

## 2013-03-21 ENCOUNTER — Encounter: Payer: Self-pay | Admitting: Family Medicine

## 2013-03-21 VITALS — BP 110/70 | HR 84 | Temp 98.2°F | Resp 20 | Ht 72.0 in | Wt 248.0 lb

## 2013-03-21 DIAGNOSIS — E785 Hyperlipidemia, unspecified: Secondary | ICD-10-CM

## 2013-03-21 DIAGNOSIS — I10 Essential (primary) hypertension: Secondary | ICD-10-CM

## 2013-03-21 DIAGNOSIS — Z9109 Other allergy status, other than to drugs and biological substances: Secondary | ICD-10-CM

## 2013-03-21 DIAGNOSIS — Z23 Encounter for immunization: Secondary | ICD-10-CM

## 2013-03-21 MED ORDER — PREDNISONE 20 MG PO TABS: ORAL_TABLET | ORAL | Status: DC

## 2013-03-21 NOTE — Progress Notes (Signed)
Subjective:    Patient ID: Vincent Short, male    DOB: 11-Jan-1951, 62 y.o.   MRN: [id number]  HPI Patient is a 62 year old white male he comes in today for followup of his medical problems. 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 He is no longer smoking. Unfortunately he requires Symbicort 2 puffs inhaled twice a day to help his breathing. He also has hypertension and hyperlipidemia. He is here today to have his blood pressure rechecked and to check his cholesterol levels. He denies any chest pain or shortness of breath. Unfortunately has been having severe seasonal allergies. He has a history of mold and mildew allergies. He is currently using Flonase every day as well as Zyrtec with minimal relief. He has bilateral sinus pressure constant postnasal drip, severe rhinorrhea, and head congestion. He denies any sinus pain or fevers. This on for over a month.  He is due for a Pneumovax given his COPD history he is also due for his flu shot. Past Medical History  Diagnosis Date  . HTN (hypertension)   . Hyperlipidemia   . Tobacco abuse   . COPD (chronic obstructive pulmonary disease)   . Lung mass     noted on CT scan(12/24/10)   . Allergic rhinitis   . Asthma   . Atelectasis     Rt upper lobe  . S/P cardiac cath     negative  . Treadmill stress test negative for angina pectoris     Dr. Tresa Endo  . GERD (gastroesophageal reflux disease)   . History of colon polyps 03/14/2009    Tubular adenomatous and Hyperplastic polyps   Past Surgical History  Procedure Laterality Date  . Bronchoscopy with airway inspection, brush cytology,  11/22/2006    Dr Craige Cotta  . Left ankle surgery  11/11/2005    Dr Thurston Hole  . Arthroscopy left wrist with radial styloidectomy.  07/06/2000    Dr Merlyn Lot  . Cardiac catheterization  07/05/2002    Dr Tresa Endo  . Tonsillectomy    . Nasal sinus surgery  1990  . Fracture surgery      Left wrist scaphoid fx  . Fracture surgery      Left  ankle surgery  . Rotator cuff repair  pending    Dr. Thurston Hole  . Lung surgery  01/21/11   Dr. Laneta Simmers    FOB, R THORACOTOMY WITH RIGHT UPPER LOBECTOMY  . Lung surgery  01/21/11    FOB, R THORACOTOMY WITH RIGHT UPPER LOBECTOMY   Current Outpatient Prescriptions on File Prior to Visit  Medication Sig Dispense Refill  . amLODipine (NORVASC) 10 MG tablet Take 1 tablet (10 mg total) by mouth daily.  30 tablet  8  . atorvastatin (LIPITOR) 40 MG tablet Take 1 tablet (40 mg total) by mouth daily.  30 tablet  2  . budesonide-formoterol (SYMBICORT) 160-4.5 MCG/ACT inhaler Inhale 2 puffs into the lungs 2 (two) times daily.        . cetirizine (ZYRTEC) 10 MG tablet Take 10 mg by mouth daily.      . fluticasone (FLONASE) 50 MCG/ACT nasal spray Place 2 sprays into the nose daily.  16 g  11  . losartan (COZAAR) 100 MG tablet Take 100 mg by mouth daily.        . nebivolol (BYSTOLIC) 10 MG tablet Take 1 tablet (10 mg total) by mouth daily.  30 tablet  0  . niacin (NIASPAN) 1000 MG CR tablet Take 1 tablet (1,000  mg total) by mouth at bedtime.  30 tablet  9  . Probiotic Product (ALIGN PO) Take by mouth.      Marland Kitchen ZETIA 10 MG tablet TAKE 1 TABLET BY MOUTH DAILY  30 tablet  6   No current facility-administered medications on file prior to visit.   Allergies  Allergen Reactions  . Contrast Media [Iodinated Diagnostic Agents] Swelling    Allergic reaction 1987, ok w/ 13 hr prep//a.c.   History   Social History  . Marital Status: Married    Spouse Name: N/A    Number of Children: N/A  . Years of Education: N/A   Occupational History  . heating and AC    Social History Main Topics  . Smoking status: Former Smoker    Types: Cigarettes    Quit date: 12/29/2010  . Smokeless tobacco: Never Used  . Alcohol Use: Yes  . Drug Use: No  . Sexual Activity: Not on file   Other Topics Concern  . Not on file   Social History Narrative    He is married.  He works in Production assistant, radio.  He smoked  1 pack of cigarettes a day for the last 35     years.  He has occasional alcohol use.   '   Review of Systems  All other systems reviewed and are negative.       Objective:   Physical Exam  Vitals reviewed. HENT:  Nose: Mucosal edema and rhinorrhea present. Right sinus exhibits no maxillary sinus tenderness and no frontal sinus tenderness. Left sinus exhibits no maxillary sinus tenderness and no frontal sinus tenderness.  Cardiovascular: Normal rate, regular rhythm and normal heart sounds.  Exam reveals no gallop and no friction rub.   No murmur heard. Pulmonary/Chest: Effort normal and breath sounds normal. No respiratory distress. He has no wheezes. He has no rales. He exhibits no tenderness.  Abdominal: Soft. Bowel sounds are normal. He exhibits no distension and no mass. There is no tenderness. There is no rebound and no guarding.  Musculoskeletal: He exhibits edema.          Assessment & Plan:  1. Environmental allergies The patient is suffering from a severe exacerbation of his allergies. I recommended getting one week if his symptoms do not improve I gave the patient prednisone taper pack to take to control his allergies to daily and the police stop falling and having no decreases the - predniSONE (DELTASONE) 20 MG tablet; 3 tabs poqdays 1-2, 2 tabs poqday 3-4, 1 tab poqday 5-6  Dispense: 12 tablet; Refill: 0  2. HTN (hypertension) Blood pressure is well-controlled. I recommend the patient discontinue amlodipine and monitor his blood pressure. His blood pressure rises greater than 140/90 we can resume amlodipine. - COMPLETE METABOLIC PANEL WITH GFR; Future - CBC with Differential; Future - Lipid panel; Future  3. HLD (hyperlipidemia) Check fasting lipid panel. LEO is less than 100. He is to return fasting tomorrow. - COMPLETE METABOLIC PANEL WITH GFR; Future - CBC with Differential; Future - Lipid panel; Future   I also gave the patient Pneumovax 23 as well as a  flu shot due to his smoking history.

## 2013-03-21 NOTE — Addendum Note (Signed)
Addended by: Legrand Rams B on: 03/21/2013 05:05 PM   Modules accepted: Orders

## 2013-03-22 ENCOUNTER — Other Ambulatory Visit: Payer: BC Managed Care – PPO

## 2013-03-22 DIAGNOSIS — I10 Essential (primary) hypertension: Secondary | ICD-10-CM

## 2013-03-22 DIAGNOSIS — E785 Hyperlipidemia, unspecified: Secondary | ICD-10-CM

## 2013-03-22 LAB — COMPLETE METABOLIC PANEL WITH GFR
Albumin: 4.3 g/dL (ref 3.5–5.2)
BUN: 13 mg/dL (ref 6–23)
CO2: 27 mEq/L (ref 19–32)
Calcium: 9.4 mg/dL (ref 8.4–10.5)
Chloride: 107 mEq/L (ref 96–112)
GFR, Est Non African American: >89 mL/min
Glucose, Bld: 107 mg/dL — ABNORMAL HIGH (ref 70–99)
Potassium: 5.2 mEq/L (ref 3.5–5.3)
Sodium: 142 mEq/L (ref 135–145)

## 2013-03-22 LAB — CBC WITH DIFFERENTIAL/PLATELET
Basophils Absolute: 0 10*3/uL (ref 0.0–0.1)
Basophils Relative: 0 % (ref 0–1)
Eosinophils Relative: 3 % (ref 0–5)
Lymphocytes Relative: 16 % (ref 12–46)
MCHC: 34.2 g/dL (ref 30.0–36.0)
Neutro Abs: 8.6 10*3/uL — ABNORMAL HIGH (ref 1.7–7.7)
Neutrophils Relative %: 70 % (ref 43–77)
Platelets: 252 10*3/uL (ref 150–400)
RDW: 14 % (ref 11.5–15.5)
WBC: 12.3 10*3/uL — ABNORMAL HIGH (ref 4.0–10.5)

## 2013-03-22 LAB — LIPID PANEL
HDL: 36 mg/dL — ABNORMAL LOW (ref >39)
Triglycerides: 100 mg/dL (ref <150)

## 2013-03-24 ENCOUNTER — Encounter: Payer: Self-pay | Admitting: Family Medicine

## 2013-04-03 ENCOUNTER — Other Ambulatory Visit: Payer: Self-pay | Admitting: *Deleted

## 2013-04-03 MED ORDER — LOSARTAN POTASSIUM 100 MG PO TABS: 100.0000 mg | ORAL_TABLET | Freq: Every day | ORAL | Status: DC

## 2013-04-03 NOTE — Telephone Encounter (Signed)
Rx was sent to pharmacy electronically. 

## 2013-04-23 ENCOUNTER — Other Ambulatory Visit: Payer: Self-pay | Admitting: Family Medicine

## 2013-04-24 ENCOUNTER — Other Ambulatory Visit: Payer: Self-pay | Admitting: Family Medicine

## 2013-08-17 ENCOUNTER — Other Ambulatory Visit: Payer: Self-pay | Admitting: Family Medicine

## 2013-08-18 ENCOUNTER — Other Ambulatory Visit: Payer: Self-pay | Admitting: *Deleted

## 2013-08-21 ENCOUNTER — Other Ambulatory Visit: Payer: Self-pay | Admitting: *Deleted

## 2013-08-21 MED ORDER — LOSARTAN POTASSIUM 100 MG PO TABS: 100.0000 mg | ORAL_TABLET | Freq: Every day | ORAL | Status: DC

## 2013-08-21 NOTE — Telephone Encounter (Signed)
Rx refill sent to patients pharmacy  

## 2013-08-25 ENCOUNTER — Other Ambulatory Visit: Payer: Self-pay | Admitting: Family Medicine

## 2013-08-25 NOTE — Telephone Encounter (Signed)
Medication refilled per protocol. 

## 2013-09-09 DIAGNOSIS — J069 Acute upper respiratory infection, unspecified: Secondary | ICD-10-CM | POA: Insufficient documentation

## 2013-09-09 DIAGNOSIS — R0982 Postnasal drip: Secondary | ICD-10-CM | POA: Insufficient documentation

## 2013-09-19 ENCOUNTER — Encounter: Payer: Self-pay | Admitting: Family Medicine

## 2013-09-19 ENCOUNTER — Ambulatory Visit (INDEPENDENT_AMBULATORY_CARE_PROVIDER_SITE_OTHER): Payer: BC Managed Care – PPO | Admitting: Family Medicine

## 2013-09-19 VITALS — BP 120/70 | HR 80 | Temp 98.5°F | Resp 18 | Ht 72.0 in | Wt 234.0 lb

## 2013-09-19 DIAGNOSIS — J441 Chronic obstructive pulmonary disease with (acute) exacerbation: Secondary | ICD-10-CM

## 2013-09-19 MED ORDER — PREDNISONE 20 MG PO TABS: ORAL_TABLET | ORAL | Status: DC

## 2013-09-19 MED ORDER — LEVOFLOXACIN 500 MG PO TABS: 500.0000 mg | ORAL_TABLET | Freq: Every day | ORAL | Status: DC

## 2013-09-19 MED ORDER — ALBUTEROL SULFATE HFA 108 (90 BASE) MCG/ACT IN AERS: 2.0000 | INHALATION_SPRAY | Freq: Four times a day (QID) | RESPIRATORY_TRACT | Status: DC | PRN

## 2013-09-19 NOTE — Progress Notes (Signed)
Subjective:    Patient ID: MIGUEL CHRISTIANA, male    DOB: Oct 28, 1950, 63 y.o.   MRN: [id number]  HPI Patient presents with 2 weeks of cough, shortness of breath, wheezing. The cough is productive of green sputum. He was treated with a Z-Pak in urgent care. The symptoms are not get any better. He also reports sneezing and increased allergies. He denies any fever or chills. Past Medical History  Diagnosis Date  . HTN (hypertension)   . Hyperlipidemia   . Tobacco abuse   . COPD (chronic obstructive pulmonary disease)   . Lung mass     noted on CT scan(12/24/10)   . Allergic rhinitis   . Asthma   . Atelectasis     Rt upper lobe  . S/P cardiac cath     negative  . Treadmill stress test negative for angina pectoris     Dr. Claiborne Billings  . GERD (gastroesophageal reflux disease)   . History of colon polyps 03/14/2009    Tubular adenomatous and Hyperplastic polyps   Current Outpatient Prescriptions on File Prior to Visit  Medication Sig Dispense Refill  . amLODipine (NORVASC) 10 MG tablet Take 1 tablet (10 mg total) by mouth daily.  30 tablet  8  . atorvastatin (LIPITOR) 40 MG tablet TAKE 1 TABLET (40 MG TOTAL) BY MOUTH DAILY.  30 tablet  2  . budesonide-formoterol (SYMBICORT) 160-4.5 MCG/ACT inhaler Inhale 2 puffs into the lungs 2 (two) times daily.        Marland Kitchen BYSTOLIC 10 MG tablet TAKE 1 TABLET BY MOUTH DAILY  30 tablet  6  . cetirizine (ZYRTEC) 10 MG tablet Take 10 mg by mouth daily.      . fluticasone (FLONASE) 50 MCG/ACT nasal spray Place 2 sprays into the nose daily.  16 g  11  . losartan (COZAAR) 100 MG tablet Take 1 tablet (100 mg total) by mouth daily.  30 tablet  2  . nebivolol (BYSTOLIC) 10 MG tablet Take 1 tablet (10 mg total) by mouth daily.  30 tablet  0  . niacin (NIASPAN) 1000 MG CR tablet Take 1 tablet (1,000 mg total) by mouth at bedtime.  30 tablet  9  . Probiotic Product (ALIGN PO) Take by mouth.      Marland Kitchen ZETIA 10 MG tablet TAKE 1 TABLET BY MOUTH DAILY  30 tablet  6   No  current facility-administered medications on file prior to visit.   Allergies  Allergen Reactions  . Contrast Media [Iodinated Diagnostic Agents] Swelling    Allergic reaction 1987, ok w/ 13 hr prep//a.c.   History   Social History  . Marital Status: Married    Spouse Name: N/A    Number of Children: N/A  . Years of Education: N/A   Occupational History  . heating and AC    Social History Main Topics  . Smoking status: Former Smoker    Types: Cigarettes    Quit date: 12/29/2010  . Smokeless tobacco: Never Used  . Alcohol Use: Yes  . Drug Use: No  . Sexual Activity: Not on file   Other Topics Concern  . Not on file   Social History Narrative    He is married.  He works in Water quality scientist.  He smoked 1 pack of cigarettes a day for the last 35     years.  He has occasional alcohol use.       Review of Systems  All other  systems reviewed and are negative.      Objective:   Physical Exam  Vitals reviewed. Cardiovascular: Normal rate, regular rhythm and normal heart sounds.  Exam reveals no gallop and no friction rub.   No murmur heard. Pulmonary/Chest: Effort normal. He has wheezes.  Abdominal: Soft. Bowel sounds are normal. He exhibits no distension and no mass. There is no tenderness. There is no rebound and no guarding.          Assessment & Plan:  1. COPD exacerbation - predniSONE (DELTASONE) 20 MG tablet; 3 tabs poqday 1-2, 2 tabs poqday 3-4, 1 tab poqday 5-6  Dispense: 12 tablet; Refill: 0 - albuterol (PROVENTIL HFA;VENTOLIN HFA) 108 (90 BASE) MCG/ACT inhaler; Inhale 2 puffs into the lungs every 6 (six) hours as needed for wheezing or shortness of breath.  Dispense: 1 Inhaler; Refill: 0 - levofloxacin (LEVAQUIN) 500 MG tablet; Take 1 tablet (500 mg total) by mouth daily.  Dispense: 7 tablet; Refill: 0

## 2013-10-12 ENCOUNTER — Other Ambulatory Visit: Payer: Self-pay | Admitting: Family Medicine

## 2013-10-24 ENCOUNTER — Telehealth: Payer: Self-pay | Admitting: Cardiovascular Disease

## 2013-10-25 NOTE — Telephone Encounter (Signed)
Closed encounter °

## 2013-11-02 ENCOUNTER — Encounter: Payer: Self-pay | Admitting: *Deleted

## 2013-11-06 ENCOUNTER — Encounter: Payer: Self-pay | Admitting: Cardiology

## 2013-11-06 ENCOUNTER — Ambulatory Visit (INDEPENDENT_AMBULATORY_CARE_PROVIDER_SITE_OTHER): Payer: BC Managed Care – PPO | Admitting: Cardiology

## 2013-11-06 VITALS — BP 121/78 | HR 71 | Ht 72.0 in | Wt 240.0 lb

## 2013-11-06 DIAGNOSIS — I2510 Atherosclerotic heart disease of native coronary artery without angina pectoris: Secondary | ICD-10-CM | POA: Insufficient documentation

## 2013-11-06 DIAGNOSIS — I10 Essential (primary) hypertension: Secondary | ICD-10-CM

## 2013-11-06 DIAGNOSIS — E785 Hyperlipidemia, unspecified: Secondary | ICD-10-CM

## 2013-11-06 DIAGNOSIS — R918 Other nonspecific abnormal finding of lung field: Secondary | ICD-10-CM

## 2013-11-06 DIAGNOSIS — R222 Localized swelling, mass and lump, trunk: Secondary | ICD-10-CM

## 2013-11-06 NOTE — Assessment & Plan Note (Signed)
LDL was 49 in Nov 2014

## 2013-11-06 NOTE — Assessment & Plan Note (Signed)
No angina. 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

## 2013-11-06 NOTE — Assessment & Plan Note (Signed)
right upper lobe lobectomy- 01/09/11 [facility]

## 2013-11-06 NOTE — Patient Instructions (Signed)
Your physician recommends that you schedule a follow-up appointment in 6 months with Dr Corky Downs.

## 2013-11-06 NOTE — Assessment & Plan Note (Signed)
Ex-smoker. He rode his bike 20 miles today

## 2013-11-06 NOTE — Progress Notes (Signed)
11/06/2013 Shelbie Hutching Cena   1950/10/02  [id number]  Primary [place], [place] Primary Cardiologist: Dr Claiborne Billings  HPI: Mr Breeze is a 63 year old gentleman who has a history of hypertension, hyperlipidemia with low HDL levels, metabolic syndrome, asthmatic/bronchitis, and mild CAD by cath in 2004. He is status post partial right upper lung resection by Dr. Cyndia Bent Sept 2012. He is here for a check up. He has done well from a cardiac standpoint. He has had some Lt arm pain and numbness with "tingling" that sounds M/S and he agrees. He says he rode his bike 20 miles today without problems.   Current Outpatient Prescriptions  Medication Sig Dispense Refill  . albuterol (PROVENTIL HFA;VENTOLIN HFA) 108 (90 BASE) MCG/ACT inhaler Inhale 2 puffs into the lungs every 6 (six) hours as needed for wheezing or shortness of breath.  1 Inhaler  0  . amLODipine (NORVASC) 10 MG tablet Take 1 tablet (10 mg total) by mouth daily.  30 tablet  8  . atorvastatin (LIPITOR) 40 MG tablet TAKE 1 TABLET (40 MG TOTAL) BY MOUTH DAILY.  30 tablet  2  . budesonide-formoterol (SYMBICORT) 160-4.5 MCG/ACT inhaler Inhale 2 puffs into the lungs 2 (two) times daily.        Marland Kitchen BYSTOLIC 10 MG tablet TAKE 1 TABLET BY MOUTH DAILY  30 tablet  6  . cetirizine (ZYRTEC) 10 MG tablet Take 10 mg by mouth daily.      . fluticasone (FLONASE) 50 MCG/ACT nasal spray PLACE 2 SPRAYS INTO THE NOSTRIL(S) DAILY  16 g  11  . guaiFENesin (MUCINEX) 600 MG 12 hr tablet Take by mouth 2 (two) times daily as needed.      Marland Kitchen losartan (COZAAR) 100 MG tablet Take 1 tablet (100 mg total) by mouth daily.  30 tablet  2  . Multiple Vitamin (MULTIVITAMIN) tablet Take 1 tablet by mouth daily.      . niacin (NIASPAN) 1000 MG CR tablet Take 1 tablet (1,000 mg total) by mouth at bedtime.  30 tablet  9  . ZETIA 10 MG tablet TAKE 1 TABLET BY MOUTH DAILY  30 tablet  6   No current facility-administered medications for this visit.    Allergies    Allergen Reactions  . Contrast Media [Iodinated Diagnostic Agents] Swelling    Allergic reaction 1987, ok w/ 13 hr prep//a.c.    History   Social History  . Marital Status: Married    Spouse Name: N/A    Number of Children: N/A  . Years of Education: N/A   Occupational History  . heating and AC    Social History Main Topics  . Smoking status: Former Smoker    Types: Cigarettes    Quit date: 12/29/2010  . Smokeless tobacco: Never Used  . Alcohol Use: Yes  . Drug Use: No  . Sexual Activity: Not on file   Other Topics Concern  . Not on file   Social History Narrative    He is married.  He works in Water quality scientist.  He smoked 1 pack of cigarettes a day for the last 35     years.  He has occasional alcohol use.      Review of Systems: General: negative for chills, fever, night sweats or weight changes.  Cardiovascular: negative for chest pain, dyspnea on exertion, edema, orthopnea, palpitations, paroxysmal nocturnal dyspnea or shortness of breath Dermatological: negative for rash Respiratory: negative for cough or wheezing Urologic: negative  for hematuria Abdominal: negative for nausea, vomiting, diarrhea, bright red blood per rectum, melena, or hematemesis Neurologic: negative for visual changes, syncope, or dizziness All other systems reviewed and are otherwise negative except as noted above.    Blood pressure 121/78, pulse 71, height 6' (1.829 m), weight 240 lb (108.863 kg).  General appearance: alert, cooperative, no distress and mildly obese Neck: no carotid bruit and no JVD Lungs: clear to auscultation bilaterally Heart: regular rate and rhythm  EKG NSR NSST changes  ASSESSMENT AND PLAN:   CAD- mild disease at cath 2004 No angina. He had 30-40% RCA in 2004  COPD (chronic obstructive pulmonary disease) Ex-smoker. He rode his bike 20 miles today  Hyperlipidemia LDL was 49 in Nov 2014  Lung mass  right upper lobe lobectomy- 01/09/11  [facility]  HYPERTENSION Controlled   PLAN  Same Rx. He can follow up with Dr Claiborne Billings in 6 months.  Dedrick Heffner KPA-C 11/06/2013 5:04 PM

## 2013-11-06 NOTE — Assessment & Plan Note (Signed)
Controlled.  

## 2013-11-16 ENCOUNTER — Other Ambulatory Visit: Payer: Self-pay

## 2013-11-16 MED ORDER — NIACIN ER (ANTIHYPERLIPIDEMIC) 1000 MG PO TBCR: 1000.0000 mg | EXTENDED_RELEASE_TABLET | Freq: Every day | ORAL | Status: DC

## 2013-11-16 NOTE — Telephone Encounter (Signed)
Rx was sent to pharmacy electronically. 

## 2013-11-20 ENCOUNTER — Telehealth: Payer: Self-pay | Admitting: *Deleted

## 2013-11-20 MED ORDER — CIPROFLOXACIN HCL 500 MG PO TABS: 500.0000 mg | ORAL_TABLET | Freq: Two times a day (BID) | ORAL | Status: DC

## 2013-11-20 NOTE — Telephone Encounter (Signed)
Prescription sent to pharmacy. .   Call placed to patient and patient made aware.  

## 2013-11-20 NOTE — Telephone Encounter (Signed)
Wife called stating that pt had stepped on nail old timber and she did get bleeding to stop and put some ointment on it and wrapped it, is UTD on tetanus but wants to know if he can get antibiotic for him for the infection. Please advise!

## 2013-11-20 NOTE — Telephone Encounter (Signed)
cipro 500 bid x 5 days ntbs immediately if signs of infection.

## 2013-12-18 ENCOUNTER — Other Ambulatory Visit: Payer: Self-pay

## 2013-12-18 MED ORDER — LOSARTAN POTASSIUM 100 MG PO TABS: 100.0000 mg | ORAL_TABLET | Freq: Every day | ORAL | Status: DC

## 2013-12-18 NOTE — Telephone Encounter (Signed)
Rx was sent to pharmacy electronically. 

## 2013-12-28 ENCOUNTER — Encounter: Payer: Self-pay | Admitting: Gastroenterology

## 2014-01-28 ENCOUNTER — Other Ambulatory Visit: Payer: Self-pay | Admitting: Cardiovascular Disease

## 2014-01-29 NOTE — Telephone Encounter (Signed)
Rx was sent to pharmacy electronically. 

## 2014-04-02 ENCOUNTER — Encounter: Payer: Self-pay | Admitting: Cardiovascular Disease

## 2014-04-03 ENCOUNTER — Encounter: Payer: Self-pay | Admitting: Cardiovascular Disease

## 2014-04-17 ENCOUNTER — Ambulatory Visit (INDEPENDENT_AMBULATORY_CARE_PROVIDER_SITE_OTHER): Payer: BC Managed Care – PPO | Admitting: Family Medicine

## 2014-04-17 DIAGNOSIS — Z23 Encounter for immunization: Secondary | ICD-10-CM

## 2014-05-04 ENCOUNTER — Telehealth: Payer: Self-pay | Admitting: Family Medicine

## 2014-05-04 NOTE — Telephone Encounter (Signed)
PA submitted through [email or web address]  

## 2014-05-07 ENCOUNTER — Ambulatory Visit (INDEPENDENT_AMBULATORY_CARE_PROVIDER_SITE_OTHER): Payer: BLUE CROSS/BLUE SHIELD | Admitting: Family Medicine

## 2014-05-07 ENCOUNTER — Encounter: Payer: Self-pay | Admitting: Family Medicine

## 2014-05-07 VITALS — BP 100/62 | HR 78 | Temp 98.5°F | Resp 20 | Ht 72.0 in | Wt 236.0 lb

## 2014-05-07 DIAGNOSIS — J441 Chronic obstructive pulmonary disease with (acute) exacerbation: Secondary | ICD-10-CM

## 2014-05-07 MED ORDER — ALBUTEROL SULFATE HFA 108 (90 BASE) MCG/ACT IN AERS
2.0000 | INHALATION_SPRAY | Freq: Four times a day (QID) | RESPIRATORY_TRACT | Status: DC | PRN
Start: 2014-05-07 — End: 2018-03-30

## 2014-05-07 MED ORDER — PREDNISONE 20 MG PO TABS: ORAL_TABLET | ORAL | Status: DC

## 2014-05-07 MED ORDER — LEVOFLOXACIN 500 MG PO TABS: 500.0000 mg | ORAL_TABLET | Freq: Every day | ORAL | Status: DC

## 2014-05-07 NOTE — Progress Notes (Signed)
Subjective:    Patient ID: Vincent Short, male    DOB: 1950/10/14, 64 y.o.   MRN: [id number]  HPI  Patient symptoms began Friday with a sore throat. Shortly thereafter he developed chest congestion. Patient's quantity of sputum produced on a daily basis has dramatically increased. It is now purulent sputum. It is green. He also reports shortness of breath and wheezing. He also reports subjective fevers and some shortness of breath. Past Medical History  Diagnosis Date  . HTN (hypertension)   . Hyperlipidemia   . Tobacco abuse   . COPD (chronic obstructive pulmonary disease)   . Lung mass     noted on CT scan(12/24/10)   . Allergic rhinitis   . Asthma   . Atelectasis     Rt upper lobe  . S/P cardiac cath     negative  . Treadmill stress test negative for angina pectoris     Dr. Claiborne Billings  . GERD (gastroesophageal reflux disease)   . History of colon polyps 03/14/2009    Tubular adenomatous and Hyperplastic polyps   Past Surgical History  Procedure Laterality Date  . Bronchoscopy with airway inspection, brush cytology,  11/22/2006    Dr Halford Chessman  . Left ankle surgery  11/11/2005    Dr Noemi Chapel  . Arthroscopy left wrist with radial styloidectomy.  07/06/2000    Dr Fredna Dow  . Tonsillectomy    . Nasal sinus surgery  1990  . Fracture surgery      Left wrist scaphoid fx  . Fracture surgery      Left ankle surgery  . Rotator cuff repair  pending    Dr. Noemi Chapel  . Lung surgery  01/21/11   Dr. Cyndia Bent    FOB, R THORACOTOMY WITH RIGHT UPPER LOBECTOMY  . Lung surgery  01/21/11    FOB, R THORACOTOMY WITH RIGHT UPPER LOBECTOMY  . Doppler echocardiography N/A 06/22/08    LV SIZE IS NORMAL. LV FUNCTION IS NORMAL. EF=>55%. THERE IS BORDERLINE CONCENTRIC LVH.  . Nuclear stress test N/A 09/09/10    NORMAL PATTERN OF PERFUSION IN ALL REGIONS. EF IS 63%. NO WALL MOTION ABNORMALITIES.  . Cardiac catheterization  07/05/2002    Dr Horton Marshall LVF, SMOOTH 10% TO 20% OSTIAL AND 10%-20% LUMINAL  IRREGULARITY OF THE PROXIMAL LAD, SMOOTH 30%-40% PROXIMAL RCA,WHICH IMPROVED FOLLOWING INTRACORONARY NTG ADMINISTRATION SUGGESTING CORONARY VASOSPASM. NO AORTOILIAC DISEASE.   Current Outpatient Prescriptions on File Prior to Visit  Medication Sig Dispense Refill  . amLODipine (NORVASC) 10 MG tablet TAKE 1 TABLET BY MOUTH DAILY 30 tablet 3  . atorvastatin (LIPITOR) 40 MG tablet TAKE 1 TABLET (40 MG TOTAL) BY MOUTH DAILY. 30 tablet 2  . budesonide-formoterol (SYMBICORT) 160-4.5 MCG/ACT inhaler Inhale 2 puffs into the lungs 2 (two) times daily.      Marland Kitchen BYSTOLIC 10 MG tablet TAKE 1 TABLET BY MOUTH DAILY 30 tablet 6  . cetirizine (ZYRTEC) 10 MG tablet Take 10 mg by mouth daily.    . fluticasone (FLONASE) 50 MCG/ACT nasal spray PLACE 2 SPRAYS INTO THE NOSTRIL(S) DAILY 16 g 11  . guaiFENesin (MUCINEX) 600 MG 12 hr tablet Take by mouth 2 (two) times daily as needed.    Marland Kitchen losartan (COZAAR) 100 MG tablet Take 1 tablet (100 mg total) by mouth daily. 30 tablet 11  . Multiple Vitamin (MULTIVITAMIN) tablet Take 1 tablet by mouth daily.    . niacin (NIASPAN) 1000 MG CR tablet Take 1 tablet (1,000 mg total) by mouth at bedtime. 30 tablet  11  . ZETIA 10 MG tablet TAKE 1 TABLET BY MOUTH DAILY 30 tablet 6   No current facility-administered medications on file prior to visit.   Allergies  Allergen Reactions  . Contrast Media [Iodinated Diagnostic Agents] Swelling    Allergic reaction 1987, ok w/ 13 hr prep//a.c.   History   Social History  . Marital Status: Married    Spouse Name: N/A    Number of Children: N/A  . Years of Education: N/A   Occupational History  . heating and AC    Social History Main Topics  . Smoking status: Former Smoker    Types: Cigarettes    Quit date: 12/29/2010  . Smokeless tobacco: Never Used  . Alcohol Use: Yes  . Drug Use: No  . Sexual Activity: Not on file   Other Topics Concern  . Not on file   Social History Narrative    He is married.  He works in Designer, fashion/clothing.  He smoked 1 pack of cigarettes a day for the last 35     years.  He has occasional alcohol use.      Review of Systems  All other systems reviewed and are negative.      Objective:   Physical Exam  Constitutional: He appears well-developed and well-nourished. No distress.  HENT:  Right Ear: External ear normal.  Left Ear: External ear normal.  Nose: Nose normal.  Mouth/Throat: Oropharynx is clear and moist. No oropharyngeal exudate.  Eyes: Conjunctivae are normal.  Neck: Neck supple. No JVD present.  Cardiovascular: Normal rate and regular rhythm.   Pulmonary/Chest: Effort normal. He has decreased breath sounds. He has wheezes. He has no rales.  Lymphadenopathy:    He has no cervical adenopathy.  Skin: He is not diaphoretic.  Vitals reviewed.         Assessment & Plan:  COPD exacerbation - Plan: predniSONE (DELTASONE) 20 MG tablet, levofloxacin (LEVAQUIN) 500 MG tablet, albuterol (PROVENTIL HFA;VENTOLIN HFA) 108 (90 BASE) MCG/ACT inhaler  Patient is developing a COPD exacerbation. I will begin Levaquin 500 mg by mouth daily for 7 days. I will also start the patient on a prednisone taper pack and give the patient albuterol 2 puffs inhaled every 4-6 hours as needed. Recheck in 48 hours if no better or sooner if worse.

## 2014-05-07 NOTE — Telephone Encounter (Signed)
Insurance denied this PA for his Bystolic but I did give him a co-pay card he can use or would you like to change his medication to something else?

## 2014-05-08 NOTE — Telephone Encounter (Signed)
Up to him, if $30 for 3 months, I'd stay on it.

## 2014-05-11 NOTE — Telephone Encounter (Signed)
Not sure what is status of cost for pt with PA denied.  Discount card may not help??  Told him to come by for some samples until we find out

## 2014-05-11 NOTE — Telephone Encounter (Signed)
LMTRC

## 2014-05-22 ENCOUNTER — Other Ambulatory Visit: Payer: Self-pay | Admitting: Cardiovascular Disease

## 2014-05-22 NOTE — Telephone Encounter (Signed)
Rx(s) sent to pharmacy electronically.  

## 2014-06-19 ENCOUNTER — Other Ambulatory Visit: Payer: Self-pay | Admitting: Family Medicine

## 2014-07-09 ENCOUNTER — Other Ambulatory Visit: Payer: Self-pay | Admitting: Family Medicine

## 2014-07-09 MED ORDER — NEBIVOLOL HCL 10 MG PO TABS: 10.0000 mg | ORAL_TABLET | Freq: Every day | ORAL | Status: DC

## 2014-07-13 ENCOUNTER — Other Ambulatory Visit: Payer: Self-pay | Admitting: Family Medicine

## 2014-07-18 ENCOUNTER — Other Ambulatory Visit: Payer: Self-pay | Admitting: Cardiovascular Disease

## 2014-07-18 NOTE — Telephone Encounter (Signed)
Rx(s) sent to pharmacy electronically.  

## 2014-07-31 ENCOUNTER — Encounter: Payer: Self-pay | Admitting: Gastroenterology

## 2014-08-21 ENCOUNTER — Other Ambulatory Visit: Payer: Self-pay | Admitting: Cardiovascular Disease

## 2014-08-21 ENCOUNTER — Other Ambulatory Visit: Payer: Self-pay | Admitting: Family Medicine

## 2014-08-21 ENCOUNTER — Encounter: Payer: Self-pay | Admitting: Family Medicine

## 2014-08-21 NOTE — Telephone Encounter (Signed)
Medication refill for one time only.  Patient needs to be seen.  Letter sent for patient to call and schedule 

## 2014-08-21 NOTE — Telephone Encounter (Signed)
Rx(s) sent to pharmacy electronically.  

## 2014-08-30 ENCOUNTER — Encounter: Payer: Self-pay | Admitting: Family Medicine

## 2014-08-30 ENCOUNTER — Ambulatory Visit (INDEPENDENT_AMBULATORY_CARE_PROVIDER_SITE_OTHER): Payer: BLUE CROSS/BLUE SHIELD | Admitting: Family Medicine

## 2014-08-30 VITALS — BP 110/60 | HR 72 | Temp 97.6°F | Resp 18 | Ht 72.0 in | Wt 232.0 lb

## 2014-08-30 DIAGNOSIS — I10 Essential (primary) hypertension: Secondary | ICD-10-CM

## 2014-08-30 DIAGNOSIS — Z125 Encounter for screening for malignant neoplasm of prostate: Secondary | ICD-10-CM

## 2014-08-30 DIAGNOSIS — L03211 Cellulitis of face: Secondary | ICD-10-CM | POA: Diagnosis not present

## 2014-08-30 DIAGNOSIS — L0201 Cutaneous abscess of face: Secondary | ICD-10-CM

## 2014-08-30 DIAGNOSIS — E785 Hyperlipidemia, unspecified: Secondary | ICD-10-CM | POA: Diagnosis not present

## 2014-08-30 DIAGNOSIS — J449 Chronic obstructive pulmonary disease, unspecified: Secondary | ICD-10-CM

## 2014-08-30 MED ORDER — AMLODIPINE BESYLATE 10 MG PO TABS: ORAL_TABLET | ORAL | Status: DC

## 2014-08-30 MED ORDER — DOXYCYCLINE HYCLATE 100 MG PO TABS: 100.0000 mg | ORAL_TABLET | Freq: Two times a day (BID) | ORAL | Status: DC

## 2014-08-30 NOTE — Progress Notes (Signed)
Subjective:    Patient ID: Vincent Short, male    DOB: 1950-08-24, 64 y.o.   MRN: [id number]  HPI Patient is here today for follow-up. His wife has multiple sclerosis and I believe is also showing signs of early dementia. She also has paranoid delusions. Unfortunately she left the patient back in January. Therefore he has been under tremendous stress. He has done everything in his power to try to help her but she is unwilling to receive that helped. Remarkably his blood pressures well controlled today at 110/60. He is long overdue for fasting lab work. I would like the patient to return fasting for a CMP and fasting lipid panel. Due to his history of mild coronary artery disease his goal LDL cholesterol is less than 70. He denies any myalgias or right upper quadrant pain.  He does have an erythematous tender inflamed papule on his right cheek. Patient states he is able to express trace amounts of pus from that papule. It has been that way for over a week. He also complains of intermittent constipation. His breathing is doing well on the Symbicort. He has not had an exacerbation recently. He continues to refrain from smoking Past Medical History  Diagnosis Date  . HTN (hypertension)   . Hyperlipidemia   . Tobacco abuse   . COPD (chronic obstructive pulmonary disease)   . Lung mass     noted on CT scan(12/24/10)   . Allergic rhinitis   . Asthma   . Atelectasis     Rt upper lobe  . S/P cardiac cath     negative  . Treadmill stress test negative for angina pectoris     Dr. Claiborne Billings  . GERD (gastroesophageal reflux disease)   . History of colon polyps 03/14/2009    Tubular adenomatous and Hyperplastic polyps   Past Surgical History  Procedure Laterality Date  . Bronchoscopy with airway inspection, brush cytology,  11/22/2006    Dr Halford Chessman  . Left ankle surgery  11/11/2005    Dr Noemi Chapel  . Arthroscopy left wrist with radial styloidectomy.  07/06/2000    Dr Fredna Dow  . Tonsillectomy    . Nasal  sinus surgery  1990  . Fracture surgery      Left wrist scaphoid fx  . Fracture surgery      Left ankle surgery  . Rotator cuff repair  pending    Dr. Noemi Chapel  . Lung surgery  01/21/11   Dr. Cyndia Bent    FOB, R THORACOTOMY WITH RIGHT UPPER LOBECTOMY  . Lung surgery  01/21/11    FOB, R THORACOTOMY WITH RIGHT UPPER LOBECTOMY  . Doppler echocardiography N/A 06/22/08    LV SIZE IS NORMAL. LV FUNCTION IS NORMAL. EF=>55%. THERE IS BORDERLINE CONCENTRIC LVH.  . Nuclear stress test N/A 09/09/10    NORMAL PATTERN OF PERFUSION IN ALL REGIONS. EF IS 63%. NO WALL MOTION ABNORMALITIES.  . Cardiac catheterization  07/05/2002    Dr Horton Marshall LVF, SMOOTH 10% TO 20% OSTIAL AND 10%-20% LUMINAL IRREGULARITY OF THE PROXIMAL LAD, SMOOTH 30%-40% PROXIMAL RCA,WHICH IMPROVED FOLLOWING INTRACORONARY NTG ADMINISTRATION SUGGESTING CORONARY VASOSPASM. NO AORTOILIAC DISEASE.   Current Outpatient Prescriptions on File Prior to Visit  Medication Sig Dispense Refill  . albuterol (PROVENTIL HFA;VENTOLIN HFA) 108 (90 BASE) MCG/ACT inhaler Inhale 2 puffs into the lungs every 6 (six) hours as needed for wheezing or shortness of breath. 1 Inhaler 5  . atorvastatin (LIPITOR) 40 MG tablet TAKE 1 TABLET EVERY DAY 30 tablet 5  .  budesonide-formoterol (SYMBICORT) 160-4.5 MCG/ACT inhaler Inhale 2 puffs into the lungs 2 (two) times daily.      . cetirizine (ZYRTEC) 10 MG tablet Take 10 mg by mouth daily.    . fluticasone (FLONASE) 50 MCG/ACT nasal spray PLACE 2 SPRAYS INTO THE NOSTRIL(S) DAILY 16 g 11  . guaiFENesin (MUCINEX) 600 MG 12 hr tablet Take by mouth 2 (two) times daily as needed.    Marland Kitchen losartan (COZAAR) 100 MG tablet Take 1 tablet (100 mg total) by mouth daily. 30 tablet 11  . Multiple Vitamin (MULTIVITAMIN) tablet Take 1 tablet by mouth daily.    . nebivolol (BYSTOLIC) 10 MG tablet Take 1 tablet (10 mg total) by mouth daily. 90 tablet 3  . niacin (NIASPAN) 1000 MG CR tablet Take 1 tablet (1,000 mg total) by mouth at bedtime.  30 tablet 11  . ZETIA 10 MG tablet TAKE 1 TABLET BY MOUTH DAILY 30 tablet 0   No current facility-administered medications on file prior to visit.   Allergies  Allergen Reactions  . Contrast Media [Iodinated Diagnostic Agents] Swelling    Allergic reaction 1987, ok w/ 13 hr prep//a.c.   History   Social History  . Marital Status: Married    Spouse Name: N/A  . Number of Children: N/A  . Years of Education: N/A   Occupational History  . heating and AC    Social History Main Topics  . Smoking status: Former Smoker    Types: Cigarettes    Quit date: 12/29/2010  . Smokeless tobacco: Never Used  . Alcohol Use: Yes  . Drug Use: No  . Sexual Activity: Not on file   Other Topics Concern  . Not on file   Social History Narrative    He is married.  He works in Water quality scientist.  He smoked 1 pack of cigarettes a day for the last 35     years.  He has occasional alcohol use.      Review of Systems  All other systems reviewed and are negative.      Objective:   Physical Exam  Constitutional: He appears well-developed and well-nourished. No distress.  Neck: Neck supple. No thyromegaly present.  Cardiovascular: Normal rate, regular rhythm and normal heart sounds.   No murmur heard. Pulmonary/Chest: Effort normal and breath sounds normal. No respiratory distress. He has no wheezes. He has no rales.  Abdominal: Soft. Bowel sounds are normal. He exhibits no distension. There is no tenderness. There is no rebound and no guarding.  Musculoskeletal: He exhibits no edema.  Lymphadenopathy:    He has no cervical adenopathy.  Skin: He is not diaphoretic.  Vitals reviewed.         Assessment & Plan:  HLD (hyperlipidemia) - Plan: COMPLETE METABOLIC PANEL WITH GFR, CBC with Differential/Platelet, Lipid panel  Benign essential HTN  COPD with chronic bronchitis  Prostate cancer screening - Plan: PSA  Cellulitis and abscess of face - Plan: doxycycline  (VIBRA-TABS) 100 MG tablet   I will give the patient doxycycline 100 mg by mouth twice a day for what looks to be an infected gland on the patient's right cheek. If not improving I would recommend a shave biopsy. Patient's blood pressure is excellent. I will make no changes in his blood pressure medication. I have asked the patient to return fasting for a fasting lipid panel area his goal LDL cholesterol is less than 70. His COPD is stable. I'll make no changes  in his medication at this time. I did recommend a colonoscopy. I will also check a PSA although the patient declines a digital rectal exam today.

## 2014-09-18 ENCOUNTER — Other Ambulatory Visit: Payer: Self-pay | Admitting: Family Medicine

## 2014-09-18 NOTE — Telephone Encounter (Signed)
Refill appropriate and filled per protocol. 

## 2014-11-15 ENCOUNTER — Encounter: Payer: Self-pay | Admitting: Gastroenterology

## 2014-11-16 ENCOUNTER — Encounter: Payer: Self-pay | Admitting: Gastroenterology

## 2014-12-04 ENCOUNTER — Other Ambulatory Visit: Payer: Self-pay | Admitting: Cardiovascular Disease

## 2014-12-04 NOTE — Telephone Encounter (Signed)
Rx(s) sent to pharmacy electronically.  

## 2014-12-24 ENCOUNTER — Other Ambulatory Visit: Payer: Self-pay | Admitting: Family Medicine

## 2014-12-24 ENCOUNTER — Encounter: Payer: Self-pay | Admitting: Family Medicine

## 2014-12-24 ENCOUNTER — Other Ambulatory Visit: Payer: Self-pay | Admitting: Cardiovascular Disease

## 2014-12-24 MED ORDER — LOSARTAN POTASSIUM 100 MG PO TABS: 100.0000 mg | ORAL_TABLET | Freq: Every day | ORAL | Status: DC

## 2014-12-24 NOTE — Telephone Encounter (Signed)
Medication refill for one time only.  Patient needs to be seen.  Letter sent for patient to call and schedule.  Pt need to return to fasting blood work from May visit.

## 2015-01-01 ENCOUNTER — Other Ambulatory Visit: Payer: BLUE CROSS/BLUE SHIELD

## 2015-01-01 LAB — COMPLETE METABOLIC PANEL WITH GFR
ALT: 26 U/L (ref 9–46)
AST: 24 U/L (ref 10–35)
Albumin: 3.9 g/dL (ref 3.6–5.1)
Alkaline Phosphatase: 61 U/L (ref 40–115)
BUN: 10 mg/dL (ref 7–25)
CALCIUM: 9.2 mg/dL (ref 8.6–10.3)
CHLORIDE: 106 mmol/L (ref 98–110)
CO2: 26 mmol/L (ref 20–31)
Creat: 0.83 mg/dL (ref 0.70–1.25)
GFR, Est African American: >89 mL/min (ref >=60)
GFR, Est Non African American: >89 mL/min (ref >=60)
Glucose, Bld: 102 mg/dL — ABNORMAL HIGH (ref 70–99)
POTASSIUM: 4.6 mmol/L (ref 3.5–5.3)
Sodium: 142 mmol/L (ref 135–146)
Total Bilirubin: 0.5 mg/dL (ref 0.2–1.2)
Total Protein: 6.4 g/dL (ref 6.1–8.1)

## 2015-01-01 LAB — CBC WITH DIFFERENTIAL/PLATELET
Basophils Absolute: 0 10*3/uL (ref 0.0–0.1)
Basophils Relative: 0 % (ref 0–1)
Eosinophils Absolute: 0.5 10*3/uL (ref 0.0–0.7)
Eosinophils Relative: 5 % (ref 0–5)
HCT: 48.5 % (ref 39.0–52.0)
HEMOGLOBIN: 16.5 g/dL (ref 13.0–17.0)
Lymphocytes Relative: 30 % (ref 12–46)
Lymphs Abs: 3.1 10*3/uL (ref 0.7–4.0)
MCH: 32 pg (ref 26.0–34.0)
MCHC: 34 g/dL (ref 30.0–36.0)
MCV: 94 fL (ref 78.0–100.0)
MPV: 10.8 fL (ref 8.6–12.4)
Monocytes Absolute: 1.2 10*3/uL — ABNORMAL HIGH (ref 0.1–1.0)
Monocytes Relative: 12 % (ref 3–12)
NEUTROS ABS: 5.5 10*3/uL (ref 1.7–7.7)
Neutrophils Relative %: 53 % (ref 43–77)
Platelets: 258 10*3/uL (ref 150–400)
RBC: 5.16 MIL/uL (ref 4.22–5.81)
RDW: 13.8 % (ref 11.5–15.5)
WBC: 10.4 10*3/uL (ref 4.0–10.5)

## 2015-01-01 LAB — LIPID PANEL
Cholesterol: 91 mg/dL — ABNORMAL LOW (ref 125–200)
HDL: 27 mg/dL — AB (ref >=40)
LDL Cholesterol: 44 mg/dL (ref <130)
Total CHOL/HDL Ratio: 3.4 Ratio (ref <=5.0)
Triglycerides: 98 mg/dL (ref <150)
VLDL: 20 mg/dL (ref <30)

## 2015-01-02 LAB — PSA: PSA: 0.34 ng/mL (ref <=4.00)

## 2015-01-03 ENCOUNTER — Encounter: Payer: Self-pay | Admitting: Family Medicine

## 2015-01-13 ENCOUNTER — Other Ambulatory Visit: Payer: Self-pay | Admitting: Family Medicine

## 2015-01-21 ENCOUNTER — Telehealth: Payer: Self-pay | Admitting: Family Medicine

## 2015-01-21 NOTE — Telephone Encounter (Signed)
[facility]  Patient calling to get rx for his niacin 5000 mg  His heart doc prescribes this but agreed to fill for him  [phone] if any questions

## 2015-01-22 MED ORDER — NIACIN ER (ANTIHYPERLIPIDEMIC) 1000 MG PO TBCR: 1000.0000 mg | EXTENDED_RELEASE_TABLET | Freq: Every day | ORAL | Status: DC

## 2015-01-22 NOTE — Telephone Encounter (Signed)
Ok's per WTP - Medication called/sent to requested pharmacy

## 2015-02-06 ENCOUNTER — Other Ambulatory Visit: Payer: Self-pay | Admitting: Family Medicine

## 2015-02-06 NOTE — Telephone Encounter (Signed)
Refill appropriate and filled per protocol. 

## 2015-03-04 ENCOUNTER — Other Ambulatory Visit: Payer: Self-pay | Admitting: Family Medicine

## 2015-03-04 NOTE — Telephone Encounter (Signed)
Refill appropriate and filled per protocol. 

## 2015-03-20 ENCOUNTER — Other Ambulatory Visit: Payer: Self-pay | Admitting: Family Medicine

## 2015-03-20 NOTE — Telephone Encounter (Signed)
Medication refilled per protocol. 

## 2015-04-01 ENCOUNTER — Ambulatory Visit (INDEPENDENT_AMBULATORY_CARE_PROVIDER_SITE_OTHER): Payer: BLUE CROSS/BLUE SHIELD | Admitting: Physician Assistant

## 2015-04-01 ENCOUNTER — Encounter: Payer: Self-pay | Admitting: Physician Assistant

## 2015-04-01 VITALS — BP 112/66 | HR 76 | Temp 98.5°F | Resp 18 | Wt 224.0 lb

## 2015-04-01 DIAGNOSIS — B9689 Other specified bacterial agents as the cause of diseases classified elsewhere: Principal | ICD-10-CM

## 2015-04-01 DIAGNOSIS — J988 Other specified respiratory disorders: Secondary | ICD-10-CM

## 2015-04-01 MED ORDER — AZITHROMYCIN 250 MG PO TABS: ORAL_TABLET | ORAL | Status: DC

## 2015-04-01 NOTE — Progress Notes (Signed)
Patient ID: BERKAY PIGNONE MRN: [id number], DOB: February 09, 1951, 64 y.o. Date of Encounter: 04/01/2015, 1:19 PM    Chief Complaint:  Chief Complaint  Patient presents with  . sick x 1 week    cough, congestion in head and chest, achy all over     HPI: 64 y.o. year old white male resents with above symptoms. He says that he always has some head and nasal congestion secondary to allergies. However says that over the past week his symptoms of increased and worsened beyond the usual. Says now he also has chest congestion and has cough productive of thick dark phlegm. He has been using Mucinex using his inhaler and using over-the-counter sinus medicine. No known fevers or chills. No significant sore throat.     Home Meds:   Outpatient Prescriptions Prior to Visit  Medication Sig Dispense Refill  . albuterol (PROVENTIL HFA;VENTOLIN HFA) 108 (90 BASE) MCG/ACT inhaler Inhale 2 puffs into the lungs every 6 (six) hours as needed for wheezing or shortness of breath. 1 Inhaler 5  . amLODipine (NORVASC) 10 MG tablet TAKE 1 TABLET BY MOUTH DAILY. 90 tablet 1  . atorvastatin (LIPITOR) 40 MG tablet TAKE 1 TABLET BY MOUTH EVERY DAY 30 tablet 5  . budesonide-formoterol (SYMBICORT) 160-4.5 MCG/ACT inhaler Inhale 2 puffs into the lungs 2 (two) times daily.      . cetirizine (ZYRTEC) 10 MG tablet Take 10 mg by mouth daily.    . fluticasone (FLONASE) 50 MCG/ACT nasal spray PLACE 2 SPRAYS INTO THE NOSTRIL(S) DAILY 16 g 11  . guaiFENesin (MUCINEX) 600 MG 12 hr tablet Take by mouth 2 (two) times daily as needed.    Marland Kitchen losartan (COZAAR) 100 MG tablet TAKE 1 TABLET EVERY DAY 30 tablet 3  . Multiple Vitamin (MULTIVITAMIN) tablet Take 1 tablet by mouth daily.    . nebivolol (BYSTOLIC) 10 MG tablet Take 1 tablet (10 mg total) by mouth daily. 90 tablet 3  . niacin (NIASPAN) 1000 MG CR tablet Take 1 tablet (1,000 mg total) by mouth at bedtime. 30 tablet 5  . ZETIA 10 MG tablet TAKE 1 TABLET BY MOUTH DAILY 30 tablet  3  . doxycycline (VIBRA-TABS) 100 MG tablet Take 1 tablet (100 mg total) by mouth 2 (two) times daily. 20 tablet 0   No facility-administered medications prior to visit.    Allergies:  Allergies  Allergen Reactions  . Contrast Media [Iodinated Diagnostic Agents] Swelling    Allergic reaction 1987, ok w/ 13 hr prep//a.c.      Review of Systems: See HPI for pertinent ROS. All other ROS negative.    Physical Exam: Blood pressure 112/66, pulse 76, temperature 98.5 F (36.9 C), temperature source Oral, resp. rate 18, weight 224 lb (101.606 kg)., Body mass index is 30.37 kg/(m^2). General:  WNWD WM. Appears in no acute distress. HEENT: Normocephalic, atraumatic, eyes without discharge, sclera non-icteric, nares are without discharge. Bilateral auditory canals clear, TM's are without perforation, pearly grey and translucent with reflective cone of light bilaterally. Oral cavity moist, posterior pharynx without exudate, erythema, peritonsillar abscess. No tenderness with percussion of frontal or maxillary sinuses.  Neck: Supple. No thyromegaly. No lymphadenopathy. Lungs: Clear bilaterally to auscultation without wheezes, rales, or rhonchi. Breathing is unlabored. Heart: Regular rhythm. No murmurs, rubs, or gallops. Msk:  Strength and tone normal for age. Extremities/Skin: Warm and dry.  Neuro: Alert and oriented X 3. Moves all extremities spontaneously. Gait is normal. CNII-XII grossly in tact. Psych:  Responds to  questions appropriately with a normal affect.     ASSESSMENT AND PLAN:  64 y.o. year old male with  1. Bacterial respiratory infection He mentioned that he had similar infection January 2015. However, he says that he was much worse at that time. I reviewed that note and he was treated with Levaquin and prednisone at that visit. I reviewed that with him but he says that he was much worse at that time. Today I hear no wheezes -- do not need prednisone at this time. Will treat with  azithromycin. He is to take this as directed. If symptoms worsen or do not return back to normal baseline within 1 week after completion of antibiotic, then follow-up. Continue using the Mucinex and his inhaled medications as directed. - azithromycin (ZITHROMAX) 250 MG tablet; Day 1: Take 2 daily.  Days 2-5: Take 1 daily.  Dispense: 6 tablet; Refill: 0   Signed, [address] [place], [place], [facility] 04/01/2015 1:19 PM

## 2015-04-12 ENCOUNTER — Ambulatory Visit (INDEPENDENT_AMBULATORY_CARE_PROVIDER_SITE_OTHER): Payer: BLUE CROSS/BLUE SHIELD | Admitting: Family Medicine

## 2015-04-12 ENCOUNTER — Encounter: Payer: Self-pay | Admitting: Family Medicine

## 2015-04-12 VITALS — BP 128/76 | HR 78 | Temp 98.2°F | Resp 16 | Ht 72.0 in | Wt 224.0 lb

## 2015-04-12 DIAGNOSIS — J441 Chronic obstructive pulmonary disease with (acute) exacerbation: Secondary | ICD-10-CM | POA: Diagnosis not present

## 2015-04-12 MED ORDER — FLUTICASONE PROPIONATE 50 MCG/ACT NA SUSP: NASAL | Status: DC

## 2015-04-12 MED ORDER — LEVOFLOXACIN 500 MG PO TABS: 500.0000 mg | ORAL_TABLET | Freq: Every day | ORAL | Status: DC

## 2015-04-12 MED ORDER — PREDNISONE 20 MG PO TABS: 40.0000 mg | ORAL_TABLET | Freq: Every day | ORAL | Status: DC

## 2015-04-12 MED ORDER — ALBUTEROL SULFATE (2.5 MG/3ML) 0.083% IN NEBU: 2.5000 mg | INHALATION_SOLUTION | Freq: Four times a day (QID) | RESPIRATORY_TRACT | Status: DC | PRN

## 2015-04-12 NOTE — Progress Notes (Signed)
Subjective:    Patient ID: Vincent Short, male    DOB: [birth year], 64 y.o.   MRN: [id number]  HPI Patient has COPD. Beginning 2 weeks ago he developed an upper respiratory infection that has now spread to his chest. He is coughing uncontrollably. He is tried a Z-Pak with no relief. He reports wheezing and shortness of breath. He also reports occasional coughs productive of  blood-tinged sputum.  He denies any chest pain. He denies any pleurisy. Shortness of breath is mild. He is not using albuterol. He believes it may be due to allergies from his yard work where he was raking of leaves the severe mold allergy that he knows he has. Past Medical History  Diagnosis Date  . HTN (hypertension)   . Hyperlipidemia   . Tobacco abuse   . COPD (chronic obstructive pulmonary disease) ([place])   . Lung mass     noted on CT scan(12/24/10)   . Allergic rhinitis   . Asthma   . Atelectasis     Rt upper lobe  . S/P cardiac cath     negative  . Treadmill stress test negative for angina pectoris     Dr. Claiborne Billings  . GERD (gastroesophageal reflux disease)   . History of colon polyps 03/14/2009    Tubular adenomatous and Hyperplastic polyps   Past Surgical History  Procedure Laterality Date  . Bronchoscopy with airway inspection, brush cytology,  11/22/2006    Dr Halford Chessman  . Left ankle surgery  11/11/2005    Dr Noemi Chapel  . Arthroscopy left wrist with radial styloidectomy.  07/06/2000    Dr Fredna Dow  . Tonsillectomy    . Nasal sinus surgery  1990  . Fracture surgery      Left wrist scaphoid fx  . Fracture surgery      Left ankle surgery  . Rotator cuff repair  pending    Dr. Noemi Chapel  . Lung surgery  01/21/11   Dr. Cyndia Bent    FOB, R THORACOTOMY WITH RIGHT UPPER LOBECTOMY  . Lung surgery  01/21/11    FOB, R THORACOTOMY WITH RIGHT UPPER LOBECTOMY  . Doppler echocardiography N/A 06/22/08    LV SIZE IS NORMAL. LV FUNCTION IS NORMAL. EF=>55%. THERE IS BORDERLINE CONCENTRIC LVH.  . Nuclear stress test N/A 09/09/10    NORMAL PATTERN OF PERFUSION IN ALL REGIONS. EF IS 63%. NO WALL MOTION ABNORMALITIES.  . Cardiac catheterization  07/05/2002    Dr Horton Marshall LVF, SMOOTH 10% TO 20% OSTIAL AND 10%-20% LUMINAL IRREGULARITY OF THE PROXIMAL LAD, SMOOTH 30%-40% PROXIMAL RCA,WHICH IMPROVED FOLLOWING INTRACORONARY NTG ADMINISTRATION SUGGESTING CORONARY VASOSPASM. NO AORTOILIAC DISEASE.   Current Outpatient Prescriptions on File Prior to Visit  Medication Sig Dispense Refill  . albuterol (PROVENTIL HFA;VENTOLIN HFA) 108 (90 BASE) MCG/ACT inhaler Inhale 2 puffs into the lungs every 6 (six) hours as needed for wheezing or shortness of breath. 1 Inhaler 5  . amLODipine (NORVASC) 10 MG tablet TAKE 1 TABLET BY MOUTH DAILY. 90 tablet 1  . atorvastatin (LIPITOR) 40 MG tablet TAKE 1 TABLET BY MOUTH EVERY DAY 30 tablet 5  . budesonide-formoterol (SYMBICORT) 160-4.5 MCG/ACT inhaler Inhale 2 puffs into the lungs 2 (two) times daily.      . cetirizine (ZYRTEC) 10 MG tablet Take 10 mg by mouth daily.    Marland Kitchen guaiFENesin (MUCINEX) 600 MG 12 hr tablet Take by mouth 2 (two) times daily as needed.    Marland Kitchen losartan (COZAAR) 100 MG tablet TAKE 1 TABLET EVERY DAY 30  tablet 3  . Multiple Vitamin (MULTIVITAMIN) tablet Take 1 tablet by mouth daily.    . nebivolol (BYSTOLIC) 10 MG tablet Take 1 tablet (10 mg total) by mouth daily. 90 tablet 3  . niacin (NIASPAN) 1000 MG CR tablet Take 1 tablet (1,000 mg total) by mouth at bedtime. 30 tablet 5  . ZETIA 10 MG tablet TAKE 1 TABLET BY MOUTH DAILY 30 tablet 3   No current facility-administered medications on file prior to visit.   Allergies  Allergen Reactions  . Contrast Media [Iodinated Diagnostic Agents] Swelling    Allergic reaction 1987, ok w/ 13 hr prep//a.c.   Social History   Social History  . Marital Status: Married    Spouse Name: N/A  . Number of Children: N/A  . Years of Education: N/A   Occupational History  . heating and AC    Social History Main Topics  . Smoking status:  Former Smoker    Types: Cigarettes    Quit date: 12/29/2010  . Smokeless tobacco: Never Used  . Alcohol Use: Yes  . Drug Use: No  . Sexual Activity: Not on file   Other Topics Concern  . Not on file   Social History Narrative    He is married.  He works in Water quality scientist.  He smoked 1 pack of cigarettes a day for the last 35     years.  He has occasional alcohol use.       Review of Systems  All other systems reviewed and are negative.      Objective:   Physical Exam  HENT:  Right Ear: External ear normal.  Left Ear: External ear normal.  Nose: Nose normal.  Mouth/Throat: Oropharynx is clear and moist. No oropharyngeal exudate.  Eyes: Conjunctivae are normal.  Neck: Neck supple. No JVD present.  Cardiovascular: Normal rate, regular rhythm and normal heart sounds.   Pulmonary/Chest: No accessory muscle usage. He has decreased breath sounds. He has wheezes. He has no rales.  Abdominal: Soft. Bowel sounds are normal.  Musculoskeletal: He exhibits no edema.  Lymphadenopathy:    He has no cervical adenopathy.  Vitals reviewed.         Assessment & Plan:  COPD exacerbation ([place]) - Plan: albuterol (PROVENTIL) (2.5 MG/3ML) 0.083% nebulizer solution, predniSONE (DELTASONE) 20 MG tablet, levofloxacin (LEVAQUIN) 500 MG tablet  Begin prednisone 40 mg a day for 7 days. Cover streptococcal pneumonia with Levaquin 500 mg by mouth daily for 7 days. Begin albuterol 2.5 mg nebs every 6 hours. Recheck next week if no better or immediately if worse

## 2015-06-15 ENCOUNTER — Other Ambulatory Visit: Payer: Self-pay | Admitting: Family Medicine

## 2015-06-17 NOTE — Telephone Encounter (Signed)
Medication refilled per protocol. 

## 2015-06-18 ENCOUNTER — Other Ambulatory Visit: Payer: Self-pay | Admitting: Family Medicine

## 2015-06-22 ENCOUNTER — Other Ambulatory Visit: Payer: Self-pay | Admitting: Family Medicine

## 2015-07-01 ENCOUNTER — Telehealth: Payer: Self-pay | Admitting: Family Medicine

## 2015-07-01 NOTE — Telephone Encounter (Signed)
Submitted PA for Bystolic through [email or web address] and it was denied on 06/19/15 - [facility] aware via phone on 07/01/15

## 2015-07-15 ENCOUNTER — Ambulatory Visit (INDEPENDENT_AMBULATORY_CARE_PROVIDER_SITE_OTHER): Payer: BLUE CROSS/BLUE SHIELD | Admitting: Family Medicine

## 2015-07-15 ENCOUNTER — Encounter: Payer: Self-pay | Admitting: Family Medicine

## 2015-07-15 VITALS — BP 118/68 | HR 68 | Temp 98.6°F | Resp 18 | Ht 72.0 in | Wt 223.0 lb

## 2015-07-15 DIAGNOSIS — J441 Chronic obstructive pulmonary disease with (acute) exacerbation: Secondary | ICD-10-CM | POA: Diagnosis not present

## 2015-07-15 MED ORDER — AZITHROMYCIN 250 MG PO TABS: ORAL_TABLET | ORAL | Status: DC

## 2015-07-15 MED ORDER — METOPROLOL SUCCINATE ER 25 MG PO TB24: 25.0000 mg | ORAL_TABLET | Freq: Every day | ORAL | Status: DC

## 2015-07-15 MED ORDER — PREDNISONE 20 MG PO TABS: ORAL_TABLET | ORAL | Status: DC

## 2015-07-15 NOTE — Progress Notes (Signed)
Subjective:    Patient ID: Vincent Short, male    DOB: 1951/03/08, 65 y.o.   MRN: [id number]  HPI Symptoms began last week. He has underlying history of COPD. However he typically does not have a cough productive of sputum. Starting last week he developed a cough productive of yellow and green sputum. He also noticed increasing shortness of breath, increased wheezing, and increased chest congestion. He is having to use his albuterol inhaler and nebulizer more frequently. He also reports subjective fevers and not feeling well Past Medical History  Diagnosis Date  . HTN (hypertension)   . Hyperlipidemia   . Tobacco abuse   . COPD (chronic obstructive pulmonary disease) ([place])   . Lung mass     noted on CT scan(12/24/10)   . Allergic rhinitis   . Asthma   . Atelectasis     Rt upper lobe  . S/P cardiac cath     negative  . Treadmill stress test negative for angina pectoris     Dr. Claiborne Billings  . GERD (gastroesophageal reflux disease)   . History of colon polyps 03/14/2009    Tubular adenomatous and Hyperplastic polyps   Past Surgical History  Procedure Laterality Date  . Bronchoscopy with airway inspection, brush cytology,  11/22/2006    Dr Halford Chessman  . Left ankle surgery  11/11/2005    Dr Noemi Chapel  . Arthroscopy left wrist with radial styloidectomy.  07/06/2000    Dr Fredna Dow  . Tonsillectomy    . Nasal sinus surgery  1990  . Fracture surgery      Left wrist scaphoid fx  . Fracture surgery      Left ankle surgery  . Rotator cuff repair  pending    Dr. Noemi Chapel  . Lung surgery  01/21/11   Dr. Cyndia Bent    FOB, R THORACOTOMY WITH RIGHT UPPER LOBECTOMY  . Lung surgery  01/21/11    FOB, R THORACOTOMY WITH RIGHT UPPER LOBECTOMY  . Doppler echocardiography N/A 06/22/08    LV SIZE IS NORMAL. LV FUNCTION IS NORMAL. EF=>55%. THERE IS BORDERLINE CONCENTRIC LVH.  . Nuclear stress test N/A 09/09/10    NORMAL PATTERN OF PERFUSION IN ALL REGIONS. EF IS 63%. NO WALL MOTION ABNORMALITIES.  . Cardiac  catheterization  07/05/2002    Dr Horton Marshall LVF, SMOOTH 10% TO 20% OSTIAL AND 10%-20% LUMINAL IRREGULARITY OF THE PROXIMAL LAD, SMOOTH 30%-40% PROXIMAL RCA,WHICH IMPROVED FOLLOWING INTRACORONARY NTG ADMINISTRATION SUGGESTING CORONARY VASOSPASM. NO AORTOILIAC DISEASE.   Current Outpatient Prescriptions on File Prior to Visit  Medication Sig Dispense Refill  . albuterol (PROVENTIL HFA;VENTOLIN HFA) 108 (90 BASE) MCG/ACT inhaler Inhale 2 puffs into the lungs every 6 (six) hours as needed for wheezing or shortness of breath. 1 Inhaler 5  . albuterol (PROVENTIL) (2.5 MG/3ML) 0.083% nebulizer solution Take 3 mLs (2.5 mg total) by nebulization every 6 (six) hours as needed for wheezing or shortness of breath. 150 mL 1  . amLODipine (NORVASC) 10 MG tablet TAKE 1 TABLET BY MOUTH DAILY. 90 tablet 1  . atorvastatin (LIPITOR) 40 MG tablet TAKE 1 TABLET BY MOUTH EVERY DAY 30 tablet 5  . budesonide-formoterol (SYMBICORT) 160-4.5 MCG/ACT inhaler Inhale 2 puffs into the lungs 2 (two) times daily.      Marland Kitchen BYSTOLIC 10 MG tablet TAKE 1 TABLET (10 MG TOTAL) BY MOUTH DAILY. 90 tablet 3  . cetirizine (ZYRTEC) 10 MG tablet Take 10 mg by mouth daily.    . fluticasone (FLONASE) 50 MCG/ACT nasal spray PLACE 2  SPRAYS INTO THE NOSTRIL(S) DAILY 16 g 11  . losartan (COZAAR) 100 MG tablet TAKE 1 TABLET EVERY DAY 90 tablet 1  . losartan (COZAAR) 100 MG tablet TAKE 1 TABLET EVERY DAY 30 tablet 11  . Multiple Vitamin (MULTIVITAMIN) tablet Take 1 tablet by mouth daily.    . niacin (NIASPAN) 1000 MG CR tablet Take 1 tablet (1,000 mg total) by mouth at bedtime. 30 tablet 5  . ZETIA 10 MG tablet TAKE 1 TABLET BY MOUTH DAILY 30 tablet 3   No current facility-administered medications on file prior to visit.   Allergies  Allergen Reactions  . Contrast Media [Iodinated Diagnostic Agents] Swelling    Allergic reaction 1987, ok w/ 13 hr prep//a.c.   Social History   Social History  . Marital Status: Married    Spouse Name: N/A    . Number of Children: N/A  . Years of Education: N/A   Occupational History  . heating and AC    Social History Main Topics  . Smoking status: Former Smoker    Types: Cigarettes    Quit date: 12/29/2010  . Smokeless tobacco: Never Used  . Alcohol Use: Yes  . Drug Use: No  . Sexual Activity: Not on file   Other Topics Concern  . Not on file   Social History Narrative    He is married.  He works in Water quality scientist.  He smoked 1 pack of cigarettes a day for the last 35     years.  He has occasional alcohol use.       Review of Systems  All other systems reviewed and are negative.      Objective:   Physical Exam  Constitutional: He appears well-developed and well-nourished.  HENT:  Nose: Nose normal.  Mouth/Throat: Oropharynx is clear and moist.  Eyes: Conjunctivae are normal.  Neck: Neck supple.  Cardiovascular: Normal rate, regular rhythm and normal heart sounds.   Pulmonary/Chest: Effort normal. He has wheezes. He has no rales. He exhibits no tenderness.  Abdominal: Soft. Bowel sounds are normal.  Lymphadenopathy:    He has no cervical adenopathy.  Vitals reviewed.         Assessment & Plan:  COPD exacerbation ([place]) - Plan: azithromycin (ZITHROMAX) 250 MG tablet, predniSONE (DELTASONE) 20 MG tablet  Patient has a COPD exacerbation. Begin a Z-Pak along with a prednisone taper pack. Use albuterol every 6 hours as needed. Should his symptoms worsen, I will switch the patient to Levaquin. Patient cannot afford zetia.  4 we will discontinue that medication and recheck his cholesterol in 3 months. He also cannot afford bystolic and would like to try switching back to Toprol-XL. He is no longer smoking and he thinks he can tolerate it now. Therefore start Toprol-XL 25 mg by mouth daily and replace bystolic.  Monitor for bronchospasms. He will wait until after this COPD exacerbation resolves prior to making the switch

## 2015-07-21 ENCOUNTER — Other Ambulatory Visit: Payer: Self-pay | Admitting: Family Medicine

## 2015-07-24 ENCOUNTER — Telehealth: Payer: Self-pay | Admitting: Family Medicine

## 2015-07-24 NOTE — Telephone Encounter (Signed)
PA for Zetia submitted through [email or web address]  - pt has been on Zetia since 3/10/214 or before with good control of HLD.

## 2015-07-26 NOTE — Telephone Encounter (Signed)
Received PA answer from ins and Brand Name is covered under ins and no PA needed as long as it is brand name

## 2015-08-04 ENCOUNTER — Other Ambulatory Visit: Payer: Self-pay | Admitting: Family Medicine

## 2015-08-14 ENCOUNTER — Other Ambulatory Visit: Payer: Self-pay | Admitting: Family Medicine

## 2015-08-15 NOTE — Telephone Encounter (Signed)
Refill appropriate and filled per protocol. 

## 2015-10-06 ENCOUNTER — Other Ambulatory Visit: Payer: Self-pay | Admitting: Family Medicine

## 2015-11-14 ENCOUNTER — Other Ambulatory Visit: Payer: Self-pay | Admitting: Family Medicine

## 2015-11-14 MED ORDER — ATORVASTATIN CALCIUM 40 MG PO TABS: 40.0000 mg | ORAL_TABLET | Freq: Every day | ORAL | Status: DC

## 2015-11-14 NOTE — Telephone Encounter (Signed)
Medication called/sent to requested pharmacy  

## 2015-12-30 ENCOUNTER — Other Ambulatory Visit: Payer: Self-pay | Admitting: Family Medicine

## 2016-01-10 ENCOUNTER — Ambulatory Visit (INDEPENDENT_AMBULATORY_CARE_PROVIDER_SITE_OTHER): Payer: Medicare Other | Admitting: Family Medicine

## 2016-01-10 ENCOUNTER — Encounter: Payer: Self-pay | Admitting: Family Medicine

## 2016-01-10 VITALS — BP 126/70 | HR 94 | Temp 98.3°F | Resp 18 | Ht 72.0 in | Wt 215.0 lb

## 2016-01-10 DIAGNOSIS — J441 Chronic obstructive pulmonary disease with (acute) exacerbation: Secondary | ICD-10-CM

## 2016-01-10 MED ORDER — AZITHROMYCIN 250 MG PO TABS: ORAL_TABLET | ORAL | 0 refills | Status: DC

## 2016-01-10 MED ORDER — SILDENAFIL CITRATE 100 MG PO TABS: 50.0000 mg | ORAL_TABLET | Freq: Every day | ORAL | 11 refills | Status: DC | PRN

## 2016-01-10 MED ORDER — PREDNISONE 20 MG PO TABS: ORAL_TABLET | ORAL | 0 refills | Status: DC

## 2016-01-10 NOTE — Progress Notes (Signed)
Subjective:    Patient ID: Vincent Short, male    DOB: 1951/04/24, 65 y.o.   MRN: [id number]  HPI Symptoms began 1 week abo. He has underlying history of COPD. However he typically does not have a cough productive of sputum. Starting last week he developed a cough productive of yellow and green sputum. He also noticed increasing shortness of breath, increased wheezing, and increased chest congestion. He is having to use his albuterol inhaler and nebulizer more frequently. He also reports subjective fevers and not feeling well.  He also reports ED Past Medical History:  Diagnosis Date  . Allergic rhinitis   . Asthma   . Atelectasis    Rt upper lobe  . COPD (chronic obstructive pulmonary disease) ([place])   . GERD (gastroesophageal reflux disease)   . History of colon polyps 03/14/2009   Tubular adenomatous and Hyperplastic polyps  . HTN (hypertension)   . Hyperlipidemia   . Lung mass    noted on CT scan(12/24/10)   . S/P cardiac cath    negative  . Tobacco abuse   . Treadmill stress test negative for angina pectoris    Dr. Claiborne Billings   Past Surgical History:  Procedure Laterality Date  . Arthroscopy left wrist with radial styloidectomy.  07/06/2000   Dr Fredna Dow  . Bronchoscopy with airway inspection, brush cytology,  11/22/2006   Dr Halford Chessman  . CARDIAC CATHETERIZATION  07/05/2002   Dr Horton Marshall LVF, SMOOTH 10% TO 20% OSTIAL AND 10%-20% LUMINAL IRREGULARITY OF THE PROXIMAL LAD, SMOOTH 30%-40% PROXIMAL RCA,WHICH IMPROVED FOLLOWING INTRACORONARY NTG ADMINISTRATION SUGGESTING CORONARY VASOSPASM. NO AORTOILIAC DISEASE.  Marland Kitchen DOPPLER ECHOCARDIOGRAPHY N/A 06/22/08   LV SIZE IS NORMAL. LV FUNCTION IS NORMAL. EF=>55%. THERE IS BORDERLINE CONCENTRIC LVH.  Marland Kitchen FRACTURE SURGERY     Left wrist scaphoid fx  . FRACTURE SURGERY     Left ankle surgery  . Left ankle surgery  11/11/2005   Dr Noemi Chapel  . LUNG SURGERY  01/21/11   Dr. Cyndia Bent   FOB, R THORACOTOMY WITH RIGHT UPPER LOBECTOMY  . LUNG SURGERY  01/21/11    FOB, R THORACOTOMY WITH RIGHT UPPER LOBECTOMY  . NASAL SINUS SURGERY  1990  . NUCLEAR STRESS TEST N/A 09/09/10   NORMAL PATTERN OF PERFUSION IN ALL REGIONS. EF IS 63%. NO WALL MOTION ABNORMALITIES.  Marland Kitchen ROTATOR CUFF REPAIR  pending   Dr. Noemi Chapel  . TONSILLECTOMY     Current Outpatient Prescriptions on File Prior to Visit  Medication Sig Dispense Refill  . albuterol (PROVENTIL HFA;VENTOLIN HFA) 108 (90 BASE) MCG/ACT inhaler Inhale 2 puffs into the lungs every 6 (six) hours as needed for wheezing or shortness of breath. 1 Inhaler 5  . albuterol (PROVENTIL) (2.5 MG/3ML) 0.083% nebulizer solution Take 3 mLs (2.5 mg total) by nebulization every 6 (six) hours as needed for wheezing or shortness of breath. 150 mL 1  . amLODipine (NORVASC) 10 MG tablet TAKE 1 TABLET BY MOUTH DAILY. 90 tablet 3  . atorvastatin (LIPITOR) 40 MG tablet Take 1 tablet (40 mg total) by mouth daily. 90 tablet 1  . budesonide-formoterol (SYMBICORT) 160-4.5 MCG/ACT inhaler Inhale 2 puffs into the lungs 2 (two) times daily.      Marland Kitchen BYSTOLIC 10 MG tablet TAKE 1 TABLET (10 MG TOTAL) BY MOUTH DAILY. 90 tablet 3  . cetirizine (ZYRTEC) 10 MG tablet Take 10 mg by mouth daily.    . fluticasone (FLONASE) 50 MCG/ACT nasal spray PLACE 2 SPRAYS INTO THE NOSTRIL(S) DAILY 16 g 11  .  losartan (COZAAR) 100 MG tablet TAKE 1 TABLET EVERY DAY 30 tablet 11  . metoprolol succinate (TOPROL-XL) 25 MG 24 hr tablet Take 1 tablet (25 mg total) by mouth daily. 90 tablet 3  . Multiple Vitamin (MULTIVITAMIN) tablet Take 1 tablet by mouth daily.    . niacin (NIASPAN) 1000 MG CR tablet TAKE 1 TABLET (1,000 MG TOTAL) BY MOUTH AT BEDTIME. 30 tablet 5  . ZETIA 10 MG tablet TAKE 1 TABLET BY MOUTH DAILY 30 tablet 5   No current facility-administered medications on file prior to visit.    Allergies  Allergen Reactions  . Contrast Media [Iodinated Diagnostic Agents] Swelling    Allergic reaction 1987, ok w/ 13 hr prep//a.c.   Social History   Social History   . Marital status: Married    Spouse name: N/A  . Number of children: N/A  . Years of education: N/A   Occupational History  . heating and AC [employer]   Social History Main Topics  . Smoking status: Former Smoker    Types: Cigarettes    Quit date: 12/29/2010  . Smokeless tobacco: Never Used  . Alcohol use Yes  . Drug use: No  . Sexual activity: Not on file   Other Topics Concern  . Not on file   Social History Narrative    He is married.  He works in Water quality scientist.  He smoked 1 pack of cigarettes a day for the last 35     years.  He has occasional alcohol use.       Review of Systems  All other systems reviewed and are negative.      Objective:   Physical Exam  Constitutional: He appears well-developed and well-nourished.  HENT:  Nose: Nose normal.  Mouth/Throat: Oropharynx is clear and moist.  Eyes: Conjunctivae are normal.  Neck: Neck supple.  Cardiovascular: Normal rate, regular rhythm and normal heart sounds.   Pulmonary/Chest: Effort normal. He has wheezes. He has no rales. He exhibits no tenderness.  Abdominal: Soft. Bowel sounds are normal.  Lymphadenopathy:    He has no cervical adenopathy.  Vitals reviewed.         Assessment & Plan:  COPD exacerbation ([place]) - Plan: azithromycin (ZITHROMAX) 250 MG tablet, predniSONE (DELTASONE) 20 MG tablet  Patient has a COPD exacerbation. Begin a Z-Pak along with a prednisone taper pack. Use albuterol every 6 hours as needed. Should his symptoms worsen, I will switch the patient to Levaquin.   Trial of viagra 50-100 poqday prn ed.

## 2016-01-16 ENCOUNTER — Telehealth: Payer: Self-pay | Admitting: *Deleted

## 2016-01-16 DIAGNOSIS — J441 Chronic obstructive pulmonary disease with (acute) exacerbation: Secondary | ICD-10-CM

## 2016-01-16 NOTE — Telephone Encounter (Signed)
Received request from pharmacy for PA on Albuterol Neb.   PA submitted.   Dx: J44.9- COPD.

## 2016-01-17 NOTE — Telephone Encounter (Signed)
Received PA determination.   PA denied under Medicare Part D.   Nebulizer medication should be run under Medicare Part B.   Pharmacy made aware.

## 2016-01-20 ENCOUNTER — Telehealth: Payer: Self-pay | Admitting: Family Medicine

## 2016-01-20 NOTE — Telephone Encounter (Signed)
Patient states he came in a few weeks ago and was told to call back if he wasn't feeling any better. He is now calling asking for a stronger antibiotic as he was instructed if he wasn't feeling better.  CB# [phone]

## 2016-01-21 MED ORDER — LEVOFLOXACIN 500 MG PO TABS: 500.0000 mg | ORAL_TABLET | Freq: Every day | ORAL | 0 refills | Status: DC

## 2016-01-21 NOTE — Telephone Encounter (Signed)
levaquin 500 qd for 7 days. 

## 2016-01-21 NOTE — Telephone Encounter (Signed)
Medication called/sent to requested pharmacy and pt aware via vm 

## 2016-03-02 DIAGNOSIS — S20212A Contusion of left front wall of thorax, initial encounter: Secondary | ICD-10-CM | POA: Diagnosis not present

## 2016-03-02 DIAGNOSIS — M1811 Unilateral primary osteoarthritis of first carpometacarpal joint, right hand: Secondary | ICD-10-CM | POA: Diagnosis not present

## 2016-03-02 DIAGNOSIS — M1812 Unilateral primary osteoarthritis of first carpometacarpal joint, left hand: Secondary | ICD-10-CM | POA: Diagnosis not present

## 2016-06-08 ENCOUNTER — Other Ambulatory Visit: Payer: Self-pay | Admitting: Family Medicine

## 2016-07-02 ENCOUNTER — Other Ambulatory Visit: Payer: Self-pay | Admitting: Family Medicine

## 2016-07-02 NOTE — Telephone Encounter (Signed)
Refill appropriate and filled per protocol. 

## 2016-08-27 ENCOUNTER — Ambulatory Visit (INDEPENDENT_AMBULATORY_CARE_PROVIDER_SITE_OTHER): Payer: Medicare Other | Admitting: Family Medicine

## 2016-08-27 ENCOUNTER — Encounter: Payer: Self-pay | Admitting: Family Medicine

## 2016-08-27 VITALS — BP 110/70 | HR 82 | Temp 98.3°F | Resp 18 | Ht 72.0 in | Wt 216.0 lb

## 2016-08-27 DIAGNOSIS — F418 Other specified anxiety disorders: Secondary | ICD-10-CM | POA: Diagnosis not present

## 2016-08-27 DIAGNOSIS — K219 Gastro-esophageal reflux disease without esophagitis: Secondary | ICD-10-CM

## 2016-08-27 DIAGNOSIS — I10 Essential (primary) hypertension: Secondary | ICD-10-CM | POA: Diagnosis not present

## 2016-08-27 MED ORDER — ALPRAZOLAM 0.5 MG PO TABS: 0.5000 mg | ORAL_TABLET | Freq: Three times a day (TID) | ORAL | 0 refills | Status: DC | PRN

## 2016-08-27 NOTE — Progress Notes (Signed)
Subjective:    Patient ID: Vincent Short, male    DOB: April 13, 1951, 66 y.o.   MRN: [id number]  HPI  Patient is in the process of a divorce. He is under tremendous amount of stress. He is under financial stress as well with a divorce. He is having a hard time sleeping. He periodically has panic attacks. Associated with that he's been having increasing acid reflux and pain whenever he eats almost like an ulcer. The pain is located under the xiphoid process. If he eats the pain occurs. Without eating there is no pain. The pain is sharp and burning in nature. He denies any blood in his stool. He denies any black tarry stool. He also reports increasing indigestion Past Medical History:  Diagnosis Date  . Allergic rhinitis   . Asthma   . Atelectasis    Rt upper lobe  . COPD (chronic obstructive pulmonary disease) ([place])   . GERD (gastroesophageal reflux disease)   . History of colon polyps 03/14/2009   Tubular adenomatous and Hyperplastic polyps  . HTN (hypertension)   . Hyperlipidemia   . Lung mass    noted on CT scan(12/24/10)   . S/P cardiac cath    negative  . Tobacco abuse   . Treadmill stress test negative for angina pectoris    Dr. Claiborne Billings   Past Surgical History:  Procedure Laterality Date  . Arthroscopy left wrist with radial styloidectomy.  07/06/2000   Dr Fredna Dow  . Bronchoscopy with airway inspection, brush cytology,  11/22/2006   Dr Halford Chessman  . CARDIAC CATHETERIZATION  07/05/2002   Dr Horton Marshall LVF, SMOOTH 10% TO 20% OSTIAL AND 10%-20% LUMINAL IRREGULARITY OF THE PROXIMAL LAD, SMOOTH 30%-40% PROXIMAL RCA,WHICH IMPROVED FOLLOWING INTRACORONARY NTG ADMINISTRATION SUGGESTING CORONARY VASOSPASM. NO AORTOILIAC DISEASE.  Marland Kitchen DOPPLER ECHOCARDIOGRAPHY N/A 06/22/08   LV SIZE IS NORMAL. LV FUNCTION IS NORMAL. EF=>55%. THERE IS BORDERLINE CONCENTRIC LVH.  Marland Kitchen FRACTURE SURGERY     Left wrist scaphoid fx  . FRACTURE SURGERY     Left ankle surgery  . Left ankle surgery  11/11/2005   Dr  Noemi Chapel  . LUNG SURGERY  01/21/11   Dr. Cyndia Bent   FOB, R THORACOTOMY WITH RIGHT UPPER LOBECTOMY  . LUNG SURGERY  01/21/11   FOB, R THORACOTOMY WITH RIGHT UPPER LOBECTOMY  . NASAL SINUS SURGERY  1990  . NUCLEAR STRESS TEST N/A 09/09/10   NORMAL PATTERN OF PERFUSION IN ALL REGIONS. EF IS 63%. NO WALL MOTION ABNORMALITIES.  Marland Kitchen ROTATOR CUFF REPAIR  pending   Dr. Noemi Chapel  . TONSILLECTOMY     Current Outpatient Prescriptions on File Prior to Visit  Medication Sig Dispense Refill  . albuterol (PROVENTIL HFA;VENTOLIN HFA) 108 (90 BASE) MCG/ACT inhaler Inhale 2 puffs into the lungs every 6 (six) hours as needed for wheezing or shortness of breath. 1 Inhaler 5  . albuterol (PROVENTIL) (2.5 MG/3ML) 0.083% nebulizer solution Take 3 mLs (2.5 mg total) by nebulization every 6 (six) hours as needed for wheezing or shortness of breath. 150 mL 1  . amLODipine (NORVASC) 10 MG tablet TAKE 1 TABLET BY MOUTH DAILY. 90 tablet 3  . atorvastatin (LIPITOR) 40 MG tablet Take 1 tablet (40 mg total) by mouth daily. 90 tablet 1  . budesonide-formoterol (SYMBICORT) 160-4.5 MCG/ACT inhaler Inhale 2 puffs into the lungs 2 (two) times daily.      . cetirizine (ZYRTEC) 10 MG tablet Take 10 mg by mouth daily.    . fluticasone (FLONASE) 50 MCG/ACT nasal spray  PLACE 2 SPRAYS INTO THE NOSTRIL(S) DAILY 48 g 3  . losartan (COZAAR) 100 MG tablet TAKE 1 TABLET EVERY DAY 30 tablet 11  . metoprolol succinate (TOPROL-XL) 25 MG 24 hr tablet TAKE 1 TABLET (25 MG TOTAL) BY MOUTH DAILY. 90 tablet 3  . Multiple Vitamin (MULTIVITAMIN) tablet Take 1 tablet by mouth daily.    . niacin (NIASPAN) 1000 MG CR tablet TAKE 1 TABLET (1,000 MG TOTAL) BY MOUTH AT BEDTIME. 30 tablet 5  . sildenafil (VIAGRA) 100 MG tablet Take 0.5-1 tablets (50-100 mg total) by mouth daily as needed for erectile dysfunction. 5 tablet 11  . BYSTOLIC 10 MG tablet TAKE 1 TABLET (10 MG TOTAL) BY MOUTH DAILY. (Patient not taking: Reported on 08/27/2016) 90 tablet 3  . ZETIA 10 MG  tablet TAKE 1 TABLET BY MOUTH DAILY (Patient not taking: Reported on 08/27/2016) 30 tablet 5   No current facility-administered medications on file prior to visit.    Allergies  Allergen Reactions  . Contrast Media [Iodinated Diagnostic Agents] Swelling    Allergic reaction 1987, ok w/ 13 hr prep//a.c.   Social History   Social History  . Marital status: Married    Spouse name: N/A  . Number of children: N/A  . Years of education: N/A   Occupational History  . heating and AC [employer]   Social History Main Topics  . Smoking status: Former Smoker    Types: Cigarettes    Quit date: 12/29/2010  . Smokeless tobacco: Never Used  . Alcohol use Yes  . Drug use: No  . Sexual activity: Not on file   Other Topics Concern  . Not on file   Social History Narrative    He is married.  He works in Water quality scientist.  He smoked 1 pack of cigarettes a day for the last 35     years.  He has occasional alcohol use.      Review of Systems  All other systems reviewed and are negative.      Objective:   Physical Exam  Neck: Neck supple. No JVD present. No thyromegaly present.  Cardiovascular: Normal rate and normal heart sounds.   No murmur heard. Pulmonary/Chest: Effort normal and breath sounds normal. No respiratory distress. He has no wheezes. He has no rales.  Abdominal: Soft. Bowel sounds are normal. He exhibits no distension. There is no tenderness. There is no rebound.  Musculoskeletal: He exhibits no edema.  Vitals reviewed.         Assessment & Plan:  Gastroesophageal reflux disease without esophagitis  Situational anxiety  I believe the patient is having reflux disease although I cannot rule out an ulcer. Begin Nexium 40 mg by mouth daily and recheck in 1-2 weeks or sooner if worse. Monitor for blood in the stool. Return fasting for a CBC, CMP, fasting lipid panel as well as a lipase. Begin Xanax 0.5 mg by mouth every 8 hours when necessary  anxiety

## 2016-08-28 ENCOUNTER — Other Ambulatory Visit: Payer: Medicare Other

## 2016-08-28 DIAGNOSIS — I10 Essential (primary) hypertension: Secondary | ICD-10-CM | POA: Diagnosis not present

## 2016-08-28 DIAGNOSIS — K219 Gastro-esophageal reflux disease without esophagitis: Secondary | ICD-10-CM | POA: Diagnosis not present

## 2016-08-28 LAB — CBC WITH DIFFERENTIAL/PLATELET
Basophils Absolute: 0 {cells}/uL (ref 0–200)
Basophils Relative: 0 %
Eosinophils Absolute: 279 {cells}/uL (ref 15–500)
Eosinophils Relative: 3 %
HCT: 49 % (ref 38.5–50.0)
Hemoglobin: 16.6 g/dL (ref 13.0–17.0)
Lymphocytes Relative: 22 %
Lymphs Abs: 2046 {cells}/uL (ref 850–3900)
MCH: 31.5 pg (ref 27.0–33.0)
MCHC: 33.9 g/dL (ref 32.0–36.0)
MCV: 93 fL (ref 80.0–100.0)
MPV: 11 fL (ref 7.5–12.5)
Monocytes Absolute: 1023 {cells}/uL — ABNORMAL HIGH (ref 200–950)
Monocytes Relative: 11 %
Neutro Abs: 5952 {cells}/uL (ref 1500–7800)
Neutrophils Relative %: 64 %
Platelets: 267 K/uL (ref 140–400)
RBC: 5.27 MIL/uL (ref 4.20–5.80)
RDW: 13.9 % (ref 11.0–15.0)
WBC: 9.3 K/uL (ref 3.8–10.8)

## 2016-08-29 LAB — LIPID PANEL
CHOL/HDL RATIO: 7.8 ratio — AB (ref <5.0)
CHOLESTEROL: 242 mg/dL — AB (ref <200)
HDL: 31 mg/dL — AB (ref >40)
LDL Cholesterol: 172 mg/dL — ABNORMAL HIGH (ref <100)
Triglycerides: 193 mg/dL — ABNORMAL HIGH (ref <150)
VLDL: 39 mg/dL — AB (ref <30)

## 2016-08-29 LAB — COMPLETE METABOLIC PANEL WITH GFR
ALT: 22 U/L (ref 9–46)
AST: 25 U/L (ref 10–35)
Albumin: 4.5 g/dL (ref 3.6–5.1)
Alkaline Phosphatase: 59 U/L (ref 40–115)
BUN: 21 mg/dL (ref 7–25)
CALCIUM: 9.5 mg/dL (ref 8.6–10.3)
CHLORIDE: 104 mmol/L (ref 98–110)
CO2: 25 mmol/L (ref 20–31)
Creat: 0.92 mg/dL (ref 0.70–1.25)
GFR, Est African American: >89 mL/min (ref >=60)
GFR, Est Non African American: 87 mL/min (ref >=60)
Glucose, Bld: 97 mg/dL (ref 70–99)
POTASSIUM: 4.5 mmol/L (ref 3.5–5.3)
Sodium: 139 mmol/L (ref 135–146)
Total Bilirubin: 0.4 mg/dL (ref 0.2–1.2)
Total Protein: 6.9 g/dL (ref 6.1–8.1)

## 2016-08-29 LAB — LIPASE: Lipase: 15 U/L (ref 7–60)

## 2016-09-02 ENCOUNTER — Other Ambulatory Visit: Payer: Self-pay | Admitting: Family Medicine

## 2016-09-02 MED ORDER — EZETIMIBE 10 MG PO TABS: 10.0000 mg | ORAL_TABLET | Freq: Every day | ORAL | 1 refills | Status: DC

## 2016-09-02 MED ORDER — ATORVASTATIN CALCIUM 40 MG PO TABS: 40.0000 mg | ORAL_TABLET | Freq: Every day | ORAL | 1 refills | Status: DC

## 2016-09-03 ENCOUNTER — Other Ambulatory Visit: Payer: Self-pay | Admitting: Family Medicine

## 2016-09-03 MED ORDER — FENOFIBRATE 160 MG PO TABS: 160.0000 mg | ORAL_TABLET | Freq: Every day | ORAL | 1 refills | Status: DC

## 2016-09-04 ENCOUNTER — Other Ambulatory Visit: Payer: Self-pay | Admitting: *Deleted

## 2016-09-04 MED ORDER — LOSARTAN POTASSIUM 100 MG PO TABS: 100.0000 mg | ORAL_TABLET | Freq: Every day | ORAL | 11 refills | Status: DC

## 2016-10-03 ENCOUNTER — Other Ambulatory Visit: Payer: Self-pay | Admitting: Family Medicine

## 2016-10-31 ENCOUNTER — Other Ambulatory Visit: Payer: Self-pay | Admitting: Family Medicine

## 2017-01-11 ENCOUNTER — Other Ambulatory Visit: Payer: Self-pay | Admitting: Family Medicine

## 2017-01-11 NOTE — Telephone Encounter (Signed)
Medication refilled per protocol. 

## 2017-02-25 ENCOUNTER — Encounter: Payer: Self-pay | Admitting: Family Medicine

## 2017-02-25 ENCOUNTER — Ambulatory Visit (INDEPENDENT_AMBULATORY_CARE_PROVIDER_SITE_OTHER): Payer: Medicare Other | Admitting: Family Medicine

## 2017-02-25 VITALS — BP 130/70 | HR 94 | Temp 98.1°F | Resp 18 | Ht 72.0 in | Wt 227.0 lb

## 2017-02-25 DIAGNOSIS — Z125 Encounter for screening for malignant neoplasm of prostate: Secondary | ICD-10-CM

## 2017-02-25 DIAGNOSIS — I10 Essential (primary) hypertension: Secondary | ICD-10-CM

## 2017-02-25 DIAGNOSIS — E7800 Pure hypercholesterolemia, unspecified: Secondary | ICD-10-CM | POA: Diagnosis not present

## 2017-02-25 DIAGNOSIS — Z0000 Encounter for general adult medical examination without abnormal findings: Secondary | ICD-10-CM

## 2017-02-25 DIAGNOSIS — K429 Umbilical hernia without obstruction or gangrene: Secondary | ICD-10-CM

## 2017-02-25 DIAGNOSIS — Z23 Encounter for immunization: Secondary | ICD-10-CM | POA: Diagnosis not present

## 2017-02-25 DIAGNOSIS — Z1159 Encounter for screening for other viral diseases: Secondary | ICD-10-CM | POA: Diagnosis not present

## 2017-02-25 NOTE — Progress Notes (Signed)
Subjective:    Patient ID: Vincent Short, male    DOB: 1950-05-13, 66 y.o.   MRN: [id number]  HPI 08/2016 Patient is in the process of a divorce. He is under tremendous amount of stress. He is under financial stress as well with a divorce. He is having a hard time sleeping. He periodically has panic attacks. Associated with that he's been having increasing acid reflux and pain whenever he eats almost like an ulcer. The pain is located under the xiphoid process. If he eats the pain occurs. Without eating there is no pain. The pain is sharp and burning in nature. He denies any blood in his stool. He denies any black tarry stool. He also reports increasing indigestion.  At that time, my plan was:  I believe the patient is having reflux disease although I cannot rule out an ulcer. Begin Nexium 40 mg by mouth daily and recheck in 1-2 weeks or sooner if worse. Monitor for blood in the stool. Return fasting for a CBC, CMP,  fasting lipid panel as well as a lipase. Begin Xanax 0.5 mg by mouth every 8 hours when necessary anxiety  02/25/17 Labs at that time revealed an LDL cholesterol of 172 despite the fact the patient was supposedly taking Lipitor 40 mg a day.  Previously his LDL cholesterol has been 44.  However I recommended that the patient had fenofibrate to Lipitor and then recheck his lab work in 3 months.  Patient has not followed up until today.  He is actually overdue for an annual wellness visit.  He is due for hepatitis C screening, prostate cancer screening.  He is due for a flu shot.  Pneumonia vaccine series is up-to-date.  Patient also requires new CPAP supplies.  He wears the machine every night as long as he sleeps.  He estimates that he wears the machine 7 hours.  He denies any hypersomnolence.  He denies any apnea spells witnessed by family members.  He is compliant with the machine.  His primary reason for making the visit today however is an umbilical hernia.  It has been present for  several years but it is getting larger.  It is roughly the size now of a $0.50 piece.  He denies any symptoms of incarceration.  However he would like it taken care of.  When I last saw the patient, he was going through a divorce.  Today he is smiling.  He is accompanied by his new fianc.  He seems like a new man.  He denies any depression or anhedonia or further panic attacks Immunization History  Administered Date(s) Administered  . Influenza,inj,Quad PF,6+ Mos 03/21/2013, 04/17/2014  . Pneumococcal Conjugate-13 04/17/2014  . Pneumococcal Polysaccharide-23 03/21/2013  . Td 06/27/2002    Past Medical History:  Diagnosis Date  . Allergic rhinitis   . Asthma   . Atelectasis    Rt upper lobe  . COPD (chronic obstructive pulmonary disease) ([place])   . GERD (gastroesophageal reflux disease)   . History of colon polyps 03/14/2009   Tubular adenomatous and Hyperplastic polyps  . HTN (hypertension)   . Hyperlipidemia   . Lung mass    noted on CT scan(12/24/10)   . S/P cardiac cath    negative  . Tobacco abuse   . Treadmill stress test negative for angina pectoris    Dr. Claiborne Billings   Past Surgical History:  Procedure Laterality Date  . Arthroscopy left wrist with radial styloidectomy.  07/06/2000   Dr Fredna Dow  .  Bronchoscopy with airway inspection, brush cytology,  11/22/2006   Dr Halford Chessman  . CARDIAC CATHETERIZATION  07/05/2002   Dr Horton Marshall LVF, SMOOTH 10% TO 20% OSTIAL AND 10%-20% LUMINAL IRREGULARITY OF THE PROXIMAL LAD, SMOOTH 30%-40% PROXIMAL RCA,WHICH IMPROVED FOLLOWING INTRACORONARY NTG ADMINISTRATION SUGGESTING CORONARY VASOSPASM. NO AORTOILIAC DISEASE.  Marland Kitchen DOPPLER ECHOCARDIOGRAPHY N/A 06/22/08   LV SIZE IS NORMAL. LV FUNCTION IS NORMAL. EF=>55%. THERE IS BORDERLINE CONCENTRIC LVH.  Marland Kitchen FRACTURE SURGERY     Left wrist scaphoid fx  . FRACTURE SURGERY     Left ankle surgery  . Left ankle surgery  11/11/2005   Dr Noemi Chapel  . LUNG SURGERY  01/21/11   Dr. Cyndia Bent   FOB, R THORACOTOMY WITH RIGHT  UPPER LOBECTOMY  . LUNG SURGERY  01/21/11   FOB, R THORACOTOMY WITH RIGHT UPPER LOBECTOMY  . NASAL SINUS SURGERY  1990  . NUCLEAR STRESS TEST N/A 09/09/10   NORMAL PATTERN OF PERFUSION IN ALL REGIONS. EF IS 63%. NO WALL MOTION ABNORMALITIES.  Marland Kitchen ROTATOR CUFF REPAIR  pending   Dr. Noemi Chapel  . TONSILLECTOMY     Current Outpatient Prescriptions on File Prior to Visit  Medication Sig Dispense Refill  . albuterol (PROVENTIL HFA;VENTOLIN HFA) 108 (90 BASE) MCG/ACT inhaler Inhale 2 puffs into the lungs every 6 (six) hours as needed for wheezing or shortness of breath. 1 Inhaler 5  . albuterol (PROVENTIL) (2.5 MG/3ML) 0.083% nebulizer solution Take 3 mLs (2.5 mg total) by nebulization every 6 (six) hours as needed for wheezing or shortness of breath. 150 mL 1  . ALPRAZolam (XANAX) 0.5 MG tablet Take 1 tablet (0.5 mg total) by mouth 3 (three) times daily as needed for anxiety. 60 tablet 0  . amLODipine (NORVASC) 10 MG tablet TAKE 1 TABLET BY MOUTH EVERY DAY 90 tablet 3  . atorvastatin (LIPITOR) 40 MG tablet Take 1 tablet (40 mg total) by mouth daily. 90 tablet 1  . budesonide-formoterol (SYMBICORT) 160-4.5 MCG/ACT inhaler Inhale 2 puffs into the lungs 2 (two) times daily.      . cetirizine (ZYRTEC) 10 MG tablet Take 10 mg by mouth daily.    . fenofibrate 160 MG tablet Take 1 tablet (160 mg total) by mouth daily. 90 tablet 1  . fluticasone (FLONASE) 50 MCG/ACT nasal spray PLACE 2 SPRAYS INTO THE NOSTRIL(S) DAILY 48 g 3  . losartan (COZAAR) 100 MG tablet Take 1 tablet (100 mg total) by mouth daily. 30 tablet 11  . metoprolol succinate (TOPROL-XL) 25 MG 24 hr tablet TAKE 1 TABLET (25 MG TOTAL) BY MOUTH DAILY. 90 tablet 3  . Multiple Vitamin (MULTIVITAMIN) tablet Take 1 tablet by mouth daily.    . niacin (NIASPAN) 1000 MG CR tablet TAKE 1 TABLET (1,000 MG TOTAL) BY MOUTH AT BEDTIME. 30 tablet 5  . sildenafil (VIAGRA) 100 MG tablet TAKE 1/2 TO 1 TABLETS (50-100 MG TOTAL) BY MOUTH DAILY AS NEEDED FOR ERECTILE  DYSFUNCTION. 5 tablet 0   No current facility-administered medications on file prior to visit.    Allergies  Allergen Reactions  . Contrast Media [Iodinated Diagnostic Agents] Swelling    Allergic reaction 1987, ok w/ 13 hr prep//a.c.   Social History   Social History  . Marital status: Married    Spouse name: N/A  . Number of children: N/A  . Years of education: N/A   Occupational History  . heating and [employer]   Social History Main Topics  . Smoking status: Former Smoker    Types: Cigarettes  Quit date: 12/29/2010  . Smokeless tobacco: Never Used  . Alcohol use Yes  . Drug use: No  . Sexual activity: Not on file   Other Topics Concern  . Not on file   Social History Narrative    He is married.  He works in Water quality scientist.  He smoked 1 pack of cigarettes a day for the last 35     years.  He has occasional alcohol use.      Review of Systems  All other systems reviewed and are negative.      Objective:   Physical Exam  Neck: Neck supple. No JVD present. No thyromegaly present.  Cardiovascular: Normal rate and normal heart sounds.   No murmur heard. Pulmonary/Chest: Effort normal and breath sounds normal. No respiratory distress. He has no wheezes. He has no rales.  Abdominal: Soft. Bowel sounds are normal. He exhibits no distension. There is no tenderness. There is no rebound.  Musculoskeletal: He exhibits no edema.  Vitals reviewed.         Assessment & Plan:  HYPERCHOLESTEROLEMIA - Plan: CBC with Differential/Platelet, COMPLETE METABOLIC PANEL WITH GFR, Lipid panel  Encounter for hepatitis C screening test for low risk patient - Plan: Hepatitis C Antibody  Benign essential HTN  Prostate cancer screening - Plan: PSA  Umbilical hernia without obstruction and without gangrene - Plan: Ambulatory referral to General Surgery  Although the patient did not schedule the appointment for this reason, an annual wellness visit  was performed today.  I will check a CMP and a fasting lipid panel to monitor his cholesterol.  Given his history of coronary artery disease, his goal LDL cholesterol is less than 70.  Immunizations are up-to-date except for the flu shot.  I encouraged the patient to get his flu shot today.  I will screen the patient for hepatitis C along with prostate cancer with PSA.  He denies any problems with dementia.  He denies any problems managing his financial affairs.  He denies any falls.  Patient states that he is wearing his CPAP every night to manage his obstructive sleep apnea.  He wears a greater than 5 hours every night and that is working properly.  He is requesting that we fax this information to Medicare so that he can have his CPAP covered.  His blood pressure today is well controlled at 130/70.   I will get the patient scheduled to see a general surgeon to correct the umbilical hernia.  I am very happy for the patient regarding his upcoming marriage.  It is a pleasure to see him doing so much better now 6 months removed from his last office visit

## 2017-02-25 NOTE — Addendum Note (Signed)
Addended by: Shary Decamp B on: 02/25/2017 11:30 AM   Modules accepted: Orders

## 2017-02-26 LAB — CBC WITH DIFFERENTIAL/PLATELET
BASOS ABS: 74 {cells}/uL (ref 0–200)
Basophils Relative: 0.8 %
EOS ABS: 552 {cells}/uL — AB (ref 15–500)
EOS PCT: 6 %
HCT: 42.9 % (ref 38.5–50.0)
HEMOGLOBIN: 14.8 g/dL (ref 13.2–17.1)
Lymphs Abs: 2263 cells/uL (ref 850–3900)
MCH: 31.2 pg (ref 27.0–33.0)
MCHC: 34.5 g/dL (ref 32.0–36.0)
MCV: 90.3 fL (ref 80.0–100.0)
MONOS PCT: 11.3 %
MPV: 11.3 fL (ref 7.5–12.5)
NEUTROS ABS: 5272 {cells}/uL (ref 1500–7800)
Neutrophils Relative %: 57.3 %
PLATELETS: 321 10*3/uL (ref 140–400)
RBC: 4.75 10*6/uL (ref 4.20–5.80)
RDW: 12.6 % (ref 11.0–15.0)
TOTAL LYMPHOCYTE: 24.6 %
WBC mixed population: 1040 cells/uL — ABNORMAL HIGH (ref 200–950)
WBC: 9.2 10*3/uL (ref 3.8–10.8)

## 2017-02-26 LAB — COMPLETE METABOLIC PANEL WITH GFR
AG RATIO: 1.8 (calc) (ref 1.0–2.5)
ALBUMIN MSPROF: 4.4 g/dL (ref 3.6–5.1)
ALKALINE PHOSPHATASE (APISO): 42 U/L (ref 40–115)
ALT: 31 U/L (ref 9–46)
AST: 31 U/L (ref 10–35)
BILIRUBIN TOTAL: 0.3 mg/dL (ref 0.2–1.2)
BUN: 14 mg/dL (ref 7–25)
CO2: 26 mmol/L (ref 20–32)
CREATININE: 1.05 mg/dL (ref 0.70–1.25)
Calcium: 9.6 mg/dL (ref 8.6–10.3)
Chloride: 106 mmol/L (ref 98–110)
GFR, Est African American: 85 mL/min/{1.73_m2} (ref >=60)
GFR, Est Non African American: 74 mL/min/{1.73_m2} (ref >=60)
GLOBULIN: 2.4 g/dL (ref 1.9–3.7)
GLUCOSE: 92 mg/dL (ref 65–99)
POTASSIUM: 5.1 mmol/L (ref 3.5–5.3)
SODIUM: 140 mmol/L (ref 135–146)
Total Protein: 6.8 g/dL (ref 6.1–8.1)

## 2017-02-26 LAB — LIPID PANEL
CHOL/HDL RATIO: 4.6 (calc) (ref <5.0)
CHOLESTEROL: 155 mg/dL (ref <200)
HDL: 34 mg/dL — ABNORMAL LOW (ref >40)
LDL Cholesterol (Calc): 94 mg/dL (calc)
Non-HDL Cholesterol (Calc): 121 mg/dL (calc) (ref <130)
Triglycerides: 175 mg/dL — ABNORMAL HIGH (ref <150)

## 2017-02-26 LAB — HEPATITIS C ANTIBODY
Hepatitis C Ab: NONREACTIVE
SIGNAL TO CUT-OFF: 0.01 (ref <1.00)

## 2017-02-26 LAB — PSA: PSA: 0.4 ng/mL (ref <=4.0)

## 2017-02-27 ENCOUNTER — Other Ambulatory Visit: Payer: Self-pay | Admitting: Family Medicine

## 2017-03-11 DIAGNOSIS — K429 Umbilical hernia without obstruction or gangrene: Secondary | ICD-10-CM | POA: Diagnosis not present

## 2017-04-29 DIAGNOSIS — M542 Cervicalgia: Secondary | ICD-10-CM | POA: Diagnosis not present

## 2017-04-29 DIAGNOSIS — S46212A Strain of muscle, fascia and tendon of other parts of biceps, left arm, initial encounter: Secondary | ICD-10-CM | POA: Diagnosis not present

## 2017-04-29 DIAGNOSIS — S46211A Strain of muscle, fascia and tendon of other parts of biceps, right arm, initial encounter: Secondary | ICD-10-CM | POA: Diagnosis not present

## 2017-05-14 ENCOUNTER — Other Ambulatory Visit: Payer: Self-pay | Admitting: Family Medicine

## 2017-05-17 NOTE — Telephone Encounter (Signed)
Ok to refill 

## 2017-06-04 DIAGNOSIS — K420 Umbilical hernia with obstruction, without gangrene: Secondary | ICD-10-CM | POA: Diagnosis not present

## 2017-06-04 DIAGNOSIS — K429 Umbilical hernia without obstruction or gangrene: Secondary | ICD-10-CM | POA: Diagnosis not present

## 2017-06-29 ENCOUNTER — Other Ambulatory Visit: Payer: Self-pay | Admitting: Orthopedic Surgery

## 2017-06-29 DIAGNOSIS — M25512 Pain in left shoulder: Secondary | ICD-10-CM

## 2017-06-29 DIAGNOSIS — S46212D Strain of muscle, fascia and tendon of other parts of biceps, left arm, subsequent encounter: Secondary | ICD-10-CM | POA: Diagnosis not present

## 2017-07-10 ENCOUNTER — Other Ambulatory Visit: Payer: Self-pay

## 2017-07-16 ENCOUNTER — Ambulatory Visit
Admission: RE | Admit: 2017-07-16 | Discharge: 2017-07-16 | Disposition: A | Discharge status: Home / Self Care | Payer: Medicare Other | Source: Ambulatory Visit | Attending: Orthopedic Surgery | Admitting: Orthopedic Surgery

## 2017-07-16 DIAGNOSIS — M75112 Incomplete rotator cuff tear or rupture of left shoulder, not specified as traumatic: Secondary | ICD-10-CM | POA: Diagnosis not present

## 2017-07-16 DIAGNOSIS — M25512 Pain in left shoulder: Secondary | ICD-10-CM

## 2017-07-29 ENCOUNTER — Other Ambulatory Visit: Payer: Self-pay | Admitting: Family Medicine

## 2017-07-30 ENCOUNTER — Other Ambulatory Visit: Payer: Self-pay | Admitting: Family Medicine

## 2017-07-31 DIAGNOSIS — M25552 Pain in left hip: Secondary | ICD-10-CM | POA: Diagnosis not present

## 2017-07-31 DIAGNOSIS — R0781 Pleurodynia: Secondary | ICD-10-CM | POA: Diagnosis not present

## 2017-08-11 ENCOUNTER — Other Ambulatory Visit: Payer: Self-pay | Admitting: Family Medicine

## 2017-08-17 DIAGNOSIS — R0781 Pleurodynia: Secondary | ICD-10-CM | POA: Diagnosis not present

## 2017-08-17 DIAGNOSIS — M25552 Pain in left hip: Secondary | ICD-10-CM | POA: Diagnosis not present

## 2017-08-25 ENCOUNTER — Encounter: Payer: Self-pay | Admitting: Family Medicine

## 2017-08-25 ENCOUNTER — Ambulatory Visit (INDEPENDENT_AMBULATORY_CARE_PROVIDER_SITE_OTHER): Payer: Medicare Other | Admitting: Family Medicine

## 2017-08-25 VITALS — BP 144/76 | HR 106 | Temp 98.5°F | Resp 20 | Ht 72.0 in | Wt 225.0 lb

## 2017-08-25 DIAGNOSIS — L03116 Cellulitis of left lower limb: Secondary | ICD-10-CM | POA: Diagnosis not present

## 2017-08-25 DIAGNOSIS — J441 Chronic obstructive pulmonary disease with (acute) exacerbation: Secondary | ICD-10-CM

## 2017-08-25 DIAGNOSIS — J302 Other seasonal allergic rhinitis: Secondary | ICD-10-CM | POA: Diagnosis not present

## 2017-08-25 DIAGNOSIS — W57XXXA Bitten or stung by nonvenomous insect and other nonvenomous arthropods, initial encounter: Secondary | ICD-10-CM | POA: Diagnosis not present

## 2017-08-25 MED ORDER — DOXYCYCLINE HYCLATE 100 MG PO TABS: 100.0000 mg | ORAL_TABLET | Freq: Two times a day (BID) | ORAL | 0 refills | Status: DC

## 2017-08-25 NOTE — Progress Notes (Signed)
Patient ID: Vincent Short, male    DOB: 09-Feb-1951, 67 y.o.   MRN: [id number]  PCP: Susy Frizzle, MD  Chief Complaint  Patient presents with  . Tick bite    Subjective:   Vincent Short is a 67 y.o. male, presents to clinic with CC of tick bite to left inner proximal thigh with redness and swelling.  He removed the tick 2 days ago he states it was very embedded and difficult to remove it he does not believe he got all of it.  He states that it looked engorged and purple like a blood blister.  He noticed it almost a week prior but he thought it was just a blister like he pinched himself.  He did scratch up the skin around the tick when try to remove it and he has tried to "mash on it" and he has some swelling and redness that has enlarged over the past 2 days.  He denies any purulent drainage, denies pain to the area.  He denies any fever, chills, sweats, body aches, arthralgias, headache, neck pain or stiffness, sore throat, rash.    He also complains of worsening seasonal allergies that have given him mild but increased nasal congestion and cough.  He is out of his Symbicort inhaler and requests a sample if we have any.  He states that he has mildly increased chest congestion but not increasing sputum production, wheeze or shortness of breath.  He denies any exertional dyspnea, chest pain or pressure, orthopnea, PND, lower extremity edema, hemoptysis.  He is starting Mucinex which usually helps.  He is not on oral steroids very frequently.    Patient Active Problem List   Diagnosis Date Noted  . CAD- mild disease at cath 2004 11/06/2013  . 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 Hyperlipidemia   . Seroma, postoperative 04/06/2011  . COPD (chronic obstructive pulmonary disease) ([place])   . Lung mass   . Atelectasis   . IRRITABLE BOWEL SYNDROME 03/07/2009  . HEMORRHAGE OF RECTUM AND ANUS 03/07/2009  . OBSTRUCTIVE SLEEP APNEA 08/08/2008  . TOBACCO ABUSE 08/06/2008  . HYPERCHOLESTEROLEMIA  07/12/2007  . HYPERTENSION 07/12/2007  . ALLERGIC RHINITIS 07/12/2007  . ASTHMA 07/12/2007  . DUODENITIS, WITHOUT HEMORRHAGE 07/12/2007     Prior to Admission medications   Medication Sig Start Date End Date Taking? Authorizing Provider  albuterol (PROVENTIL HFA;VENTOLIN HFA) 108 (90 BASE) MCG/ACT inhaler Inhale 2 puffs into the lungs every 6 (six) hours as needed for wheezing or shortness of breath. 05/07/14  Yes Susy Frizzle, MD  albuterol (PROVENTIL) (2.5 MG/3ML) 0.083% nebulizer solution Take 3 mLs (2.5 mg total) by nebulization every 6 (six) hours as needed for wheezing or shortness of breath. 04/12/15  Yes Susy Frizzle, MD  ALPRAZolam Duanne Moron) 0.5 MG tablet Take 1 tablet (0.5 mg total) by mouth 3 (three) times daily as needed for anxiety. 08/27/16  Yes Susy Frizzle, MD  amLODipine (NORVASC) 10 MG tablet TAKE 1 TABLET BY MOUTH EVERY DAY 10/05/16  Yes Susy Frizzle, MD  atorvastatin (LIPITOR) 40 MG tablet TAKE 1 TABLET BY MOUTH EVERY DAY 08/11/17  Yes Susy Frizzle, MD  budesonide-formoterol [facility]) 160-4.5 MCG/ACT inhaler Inhale 2 puffs into the lungs 2 (two) times daily.     Yes [provider]  cetirizine (ZYRTEC) 10 MG tablet Take 10 mg by mouth daily.   Yes [provider]  fenofibrate 160 MG tablet TAKE 1 TABLET BY MOUTH EVERY DAY 08/11/17  Yes Susy Frizzle, MD  fluticasone [facility]) 50 MCG/ACT nasal spray PLACE 2 SPRAYS INTO THE NOSTRIL(S) DAILY 08/11/17  Yes Susy Frizzle, MD  losartan (COZAAR) 100 MG tablet TAKE 1 TABLET BY MOUTH EVERY DAY 07/30/17  Yes Susy Frizzle, MD  metoprolol succinate (TOPROL-XL) 25 MG 24 hr tablet TAKE 1 TABLET BY MOUTH EVERY DAY 07/29/17  Yes Susy Frizzle, MD  Multiple Vitamin (MULTIVITAMIN) tablet Take 1 tablet by mouth daily.   Yes [provider]  niacin (NIASPAN) 1000 MG CR tablet TAKE 1 TABLET (1,000 MG TOTAL) BY MOUTH AT BEDTIME. 08/15/15  Yes Pickard, Cammie Mcgee, MD  sildenafil (VIAGRA) 100  MG tablet TAKE 1/2 TO 1 TABLETS BY MOUTH DAILY AS NEEDED FOR ERECTILE DYSFUNCTION. 05/17/17  Yes Susy Frizzle, MD     Allergies  Allergen Reactions  . Contrast Media [Iodinated Diagnostic Agents] Swelling    Allergic reaction 1987, ok w/ 13 hr prep//a.c.     Family History  Problem Relation Age of Onset  . Coronary artery disease Father   . Allergic rhinitis Father   . Heart disease Father   . Coronary artery disease Mother   . Allergic rhinitis Mother   . Heart disease Mother   . Kidney disease Mother   . Stroke Mother      Social History   Socioeconomic History  . Marital status: Married    Spouse name: Not on file  . Number of children: Not on file  . Years of education: Not on file  . Highest education level: Not on file  Occupational History  . Occupation: Tourist information centre manager    Employer: [employer]  . Financial resource strain: Not on file  . Food insecurity:    Worry: Not on file    Inability: Not on file  . Transportation needs:    Medical: Not on file    Non-medical: Not on file  Tobacco Use  . Smoking status: Former Smoker    Types: Cigarettes    Last attempt to quit: 12/29/2010    Years since quitting: 6.6  . Smokeless tobacco: Never Used  Substance and Sexual Activity  . Alcohol use: Yes  . Drug use: No  . Sexual activity: Not on file  Lifestyle  . Physical activity:    Days per week: Not on file    Minutes per session: Not on file  . Stress: Not on file  Relationships  . Social connections:    Talks on phone: Not on file    Gets together: Not on file    Attends religious service: Not on file    Active member of club or organization: Not on file    Attends meetings of clubs or organizations: Not on file    Relationship status: Not on file  . Intimate partner violence:    Fear of current or ex partner: Not on file    Emotionally abused: Not on file    Physically abused: Not on file    Forced sexual activity: Not on file   Other Topics Concern  . Not on file  Social History Narrative    He is married.  He works in Water quality scientist.  He smoked 1 pack of cigarettes a day for the last 35     years.  He has occasional alcohol use.      Review of Systems  Constitutional: Negative.  Negative for activity change, appetite  change, chills, diaphoresis, fatigue, fever and unexpected weight change.  HENT: Positive for congestion, postnasal drip, rhinorrhea, sinus pressure and sneezing. Negative for drooling, ear discharge, ear pain, facial swelling, hearing loss, mouth sores, nosebleeds, sore throat, tinnitus, trouble swallowing and voice change.   Eyes: Negative.  Negative for pain, discharge, redness and itching.  Respiratory: Positive for cough. Negative for apnea, choking, chest tightness, shortness of breath, wheezing and stridor.   Cardiovascular: Negative for chest pain and leg swelling.  Gastrointestinal: Negative.  Negative for abdominal pain, constipation, diarrhea, nausea and vomiting.  Endocrine: Negative.   Genitourinary: Negative.   Musculoskeletal: Negative.  Negative for arthralgias, joint swelling and myalgias.  Skin: Positive for color change. Negative for pallor and rash.  Allergic/Immunologic: Positive for environmental allergies.  Neurological: Negative.  Negative for dizziness, tremors, syncope, weakness, light-headedness, numbness and headaches.  Hematological: Negative.   Psychiatric/Behavioral: Negative.   All other systems reviewed and are negative.      Objective:    Vitals:   08/25/17 1030  BP: (!) 144/76  Pulse: (!) 106  Resp: 20  Temp: 98.5 F (36.9 C)  TempSrc: Oral  SpO2: 94%  Weight: 225 lb (102.1 kg)  Height: 6' (1.829 m)      Physical Exam  Constitutional: He is oriented to person, place, and time. He appears well-developed and well-nourished.  Non-toxic appearance. He does not appear ill. No distress.  Well-appearing male, appears slightly older  than stated age, no acute distress  HENT:  Head: Normocephalic and atraumatic.  Right Ear: Tympanic membrane, external ear and ear canal normal.  Left Ear: Tympanic membrane, external ear and ear canal normal.  Nose: No mucosal edema or rhinorrhea. Right sinus exhibits no maxillary sinus tenderness and no frontal sinus tenderness. Left sinus exhibits no maxillary sinus tenderness and no frontal sinus tenderness.  Mouth/Throat: Uvula is midline. No trismus in the jaw. No uvula swelling. No oropharyngeal exudate, posterior oropharyngeal edema or posterior oropharyngeal erythema.  Posterior oropharynx with diffuse erythema, tonsils not visualized, uvula midline, no exudate Mild nasal congestion with mild edema and erythema  Eyes: Pupils are equal, round, and reactive to light. Conjunctivae, EOM and lids are normal. Right eye exhibits no discharge. Left eye exhibits no discharge. No scleral icterus.  Neck: Trachea normal, normal range of motion and phonation normal. Neck supple. No tracheal deviation present.  Cardiovascular: Normal rate, regular rhythm, normal heart sounds, intact distal pulses and normal pulses. Exam reveals no gallop and no friction rub.  No murmur heard. Pulses:      Radial pulses are 2+ on the right side, and 2+ on the left side.       Posterior tibial pulses are 2+ on the right side, and 2+ on the left side.  HR 80-90's  Pulmonary/Chest: Effort normal and breath sounds normal. No stridor. No respiratory distress. He has no wheezes. He has no rhonchi. He has no rales.  Abdominal: Soft. Normal appearance and bowel sounds are normal. He exhibits no distension. There is no tenderness. There is no rebound and no guarding.  Musculoskeletal: Normal range of motion. He exhibits no edema.  Lymphadenopathy:    He has no cervical adenopathy.  Neurological: He is alert and oriented to person, place, and time. He exhibits normal muscle tone. Coordination and gait normal.  Skin: Skin is  warm, dry and intact. Capillary refill takes less than 2 seconds. No rash noted. He is not diaphoretic. There is erythema.  8 x 4 cm area of erythema  and edema to left proximal inner thigh with roughly 1 cm central area of excoriations and scabbing.  Around the scabbing roughly 2 cm radius of induration, no fluctuance, no drainage No surrounding rash or streaking redness  Psychiatric: He has a normal mood and affect. His speech is normal and behavior is normal.  Nursing note and vitals reviewed.          Assessment & Plan:      ICD-10-CM   1. Cellulitis of left lower extremity L03.116   2. Tick bite, initial encounter W57.XXXA   3. Seasonal allergic rhinitis, unspecified trigger J30.2   4. COPD exacerbation (HCC) J44.1    sample of symbicort given  budesonide-formoterol 160-4.5 MCG/ACT inhaler      Inhale 2 puffs into the lungs 2 (two) times daily. cont allergy meds + mucinex    Apply antibiotic ointment to abrasions on left thigh twice a day otherwise keep clean and dry, instructed not to mash on it Did feel some induration around the excoriated area but did not feel any fluctuance no concern for abscess at this time Will prescribe doxycycline for tickborne illness coverage and also for treatment of cellulitis. Gave a prescription for 10 days discussed that if the redness is gone in 7 days he can discontinue it.  Also discussed that if he has any other symptoms which are concerning for tickborne illnesses which we have discussed at length, he would need to continue the antibiotic until he feels well and then take 2 more days.  If he continues to feel ill or if the redness is not improved by the end of his antibiotic prescription he is to come in for recheck or call to discuss.  He understands that if the redness is spreading beyond where it currently is in 24 to 48 hours from now also to return for recheck.  Patient also complained of worsening seasonal allergies that have been  triggering more of his COPD.  However at this time he does not have increased sputum production, shortness of breath, fever chills or wheeze he is out of his Symbicort, states it is very expensive for him with his insurance and asked if he could have a sample.  One was given to him.  His lungs were clear, no distress, vital signs stable His nose and throat were mildly erythematous with some nasal congestion, instructed to continue allergy control medicines.   Vital signs when patient presented with significant for tachycardia however at the time of my exam, heart regular rate and rhythm, good pulses, no signs of distress, nontoxic-appearing.  He left in good condition   Delsa Grana, PA-C 08/25/17 11:56 AM

## 2017-10-27 ENCOUNTER — Other Ambulatory Visit: Payer: Self-pay | Admitting: Family Medicine

## 2017-12-02 DIAGNOSIS — M9903 Segmental and somatic dysfunction of lumbar region: Secondary | ICD-10-CM | POA: Diagnosis not present

## 2017-12-02 DIAGNOSIS — M545 Low back pain: Secondary | ICD-10-CM | POA: Diagnosis not present

## 2017-12-02 DIAGNOSIS — M9902 Segmental and somatic dysfunction of thoracic region: Secondary | ICD-10-CM | POA: Diagnosis not present

## 2017-12-02 DIAGNOSIS — M9901 Segmental and somatic dysfunction of cervical region: Secondary | ICD-10-CM | POA: Diagnosis not present

## 2017-12-06 ENCOUNTER — Ambulatory Visit (INDEPENDENT_AMBULATORY_CARE_PROVIDER_SITE_OTHER): Payer: Medicare Other | Admitting: Family Medicine

## 2017-12-06 ENCOUNTER — Encounter: Payer: Self-pay | Admitting: Family Medicine

## 2017-12-06 VITALS — BP 150/80 | HR 80 | Temp 98.0°F | Resp 16 | Ht 72.0 in | Wt 228.0 lb

## 2017-12-06 DIAGNOSIS — R55 Syncope and collapse: Secondary | ICD-10-CM | POA: Diagnosis not present

## 2017-12-06 DIAGNOSIS — I10 Essential (primary) hypertension: Secondary | ICD-10-CM

## 2017-12-06 LAB — COMPLETE METABOLIC PANEL WITH GFR
AG Ratio: 1.7 (calc) (ref 1.0–2.5)
ALBUMIN MSPROF: 4.5 g/dL (ref 3.6–5.1)
ALKALINE PHOSPHATASE (APISO): 95 U/L (ref 40–115)
ALT: 23 U/L (ref 9–46)
AST: 28 U/L (ref 10–35)
BUN: 20 mg/dL (ref 7–25)
CHLORIDE: 105 mmol/L (ref 98–110)
CO2: 26 mmol/L (ref 20–32)
Calcium: 9.8 mg/dL (ref 8.6–10.3)
Creat: 1.03 mg/dL (ref 0.70–1.25)
GFR, Est African American: 87 mL/min/{1.73_m2} (ref >=60)
GFR, Est Non African American: 75 mL/min/{1.73_m2} (ref >=60)
GLOBULIN: 2.6 g/dL (ref 1.9–3.7)
Glucose, Bld: 103 mg/dL — ABNORMAL HIGH (ref 65–99)
Potassium: 5 mmol/L (ref 3.5–5.3)
SODIUM: 140 mmol/L (ref 135–146)
Total Bilirubin: 0.3 mg/dL (ref 0.2–1.2)
Total Protein: 7.1 g/dL (ref 6.1–8.1)

## 2017-12-06 LAB — CBC WITH DIFFERENTIAL/PLATELET
BASOS ABS: 74 {cells}/uL (ref 0–200)
Basophils Relative: 0.7 %
EOS ABS: 536 {cells}/uL — AB (ref 15–500)
Eosinophils Relative: 5.1 %
HEMATOCRIT: 45.4 % (ref 38.5–50.0)
HEMOGLOBIN: 15.4 g/dL (ref 13.2–17.1)
LYMPHS ABS: 2247 {cells}/uL (ref 850–3900)
MCH: 30.7 pg (ref 27.0–33.0)
MCHC: 33.9 g/dL (ref 32.0–36.0)
MCV: 90.6 fL (ref 80.0–100.0)
MPV: 11.4 fL (ref 7.5–12.5)
Monocytes Relative: 10 %
NEUTROS ABS: 6594 {cells}/uL (ref 1500–7800)
Neutrophils Relative %: 62.8 %
Platelets: 301 10*3/uL (ref 140–400)
RBC: 5.01 10*6/uL (ref 4.20–5.80)
RDW: 12.4 % (ref 11.0–15.0)
Total Lymphocyte: 21.4 %
WBC mixed population: 1050 cells/uL — ABNORMAL HIGH (ref 200–950)
WBC: 10.5 10*3/uL (ref 3.8–10.8)

## 2017-12-06 MED ORDER — HYDROCHLOROTHIAZIDE 25 MG PO TABS: 25.0000 mg | ORAL_TABLET | Freq: Every day | ORAL | 3 refills | Status: DC

## 2017-12-06 NOTE — Progress Notes (Signed)
Subjective:    Patient ID: Vincent Short, male    DOB: [birth year], 67 y.o.   MRN: [id number]  HPI Patient has been doing really well.  He is remarried.  He is much happier.  As result he is eating more and has gained weight.  As result, his blood pressure has steadily increased.  Recently, both he and his wife have been checking it over the last week and I found it to be averaging between 150 and 170/90-100 fairly consistently.  I personally checked it today and found to be 150/90.  Therefore it appears that he needs increase in his antihypertensive medication.  Unfortunately, Sunday, the patient had an unusual episode.  He was standing in line at a [employer].  He turned to go get his drink.  Suddenly he felt like he was going to pass out.  There was no vertigo.  The room did not spin.  Patient states that he felt like everything was going black.  He had to hold on to the drink machine to keep from falling.  Symptoms gradually improved over 10 to 20 seconds.  He has felt fine ever since.  He denied any palpitations during the event.  He denied any chest pain or shortness of breath during the event.  He denies any recent palpitations or arrhythmias. Immunization History  Administered Date(s) Administered  . Influenza, High Dose Seasonal PF 02/25/2017  . Influenza,inj,Quad PF,6+ Mos 03/21/2013, 04/17/2014  . Influenza-Unspecified 02/15/2003, 03/01/2004  . Pneumococcal Conjugate-13 04/17/2014  . Pneumococcal Polysaccharide-23 03/21/2013  . Td 06/27/2002    Past Medical History:  Diagnosis Date  . Allergic rhinitis   . Asthma   . Atelectasis    Rt upper lobe  . COPD (chronic obstructive pulmonary disease) ([place])   . GERD (gastroesophageal reflux disease)   . History of colon polyps 03/14/2009   Tubular adenomatous and Hyperplastic polyps  . HTN (hypertension)   . Hyperlipidemia   . Lung mass    noted on CT scan(12/24/10)   . S/P cardiac cath    negative  . Tobacco abuse   .  Treadmill stress test negative for angina pectoris    Dr. Claiborne Billings   Past Surgical History:  Procedure Laterality Date  . Arthroscopy left wrist with radial styloidectomy.  07/06/2000   Dr Fredna Dow  . Bronchoscopy with airway inspection, brush cytology,  11/22/2006   Dr Halford Chessman  . CARDIAC CATHETERIZATION  07/05/2002   Dr Horton Marshall LVF, SMOOTH 10% TO 20% OSTIAL AND 10%-20% LUMINAL IRREGULARITY OF THE PROXIMAL LAD, SMOOTH 30%-40% PROXIMAL RCA,WHICH IMPROVED FOLLOWING INTRACORONARY NTG ADMINISTRATION SUGGESTING CORONARY VASOSPASM. NO AORTOILIAC DISEASE.  Marland Kitchen DOPPLER ECHOCARDIOGRAPHY N/A 06/22/08   LV SIZE IS NORMAL. LV FUNCTION IS NORMAL. EF=>55%. THERE IS BORDERLINE CONCENTRIC LVH.  Marland Kitchen FRACTURE SURGERY     Left wrist scaphoid fx  . FRACTURE SURGERY     Left ankle surgery  . Left ankle surgery  11/11/2005   Dr Noemi Chapel  . LUNG SURGERY  01/21/11   Dr. Cyndia Bent   FOB, R THORACOTOMY WITH RIGHT UPPER LOBECTOMY  . LUNG SURGERY  01/21/11   FOB, R THORACOTOMY WITH RIGHT UPPER LOBECTOMY  . NASAL SINUS SURGERY  1990  . NUCLEAR STRESS TEST N/A 09/09/10   NORMAL PATTERN OF PERFUSION IN ALL REGIONS. EF IS 63%. NO WALL MOTION ABNORMALITIES.  Marland Kitchen ROTATOR CUFF REPAIR  pending   Dr. Noemi Chapel  . TONSILLECTOMY     Current Outpatient Medications on File Prior to Visit  Medication  Sig Dispense Refill  . albuterol (PROVENTIL HFA;VENTOLIN HFA) 108 (90 BASE) MCG/ACT inhaler Inhale 2 puffs into the lungs every 6 (six) hours as needed for wheezing or shortness of breath. 1 Inhaler 5  . albuterol (PROVENTIL) (2.5 MG/3ML) 0.083% nebulizer solution Take 3 mLs (2.5 mg total) by nebulization every 6 (six) hours as needed for wheezing or shortness of breath. 150 mL 1  . ALPRAZolam (XANAX) 0.5 MG tablet Take 1 tablet (0.5 mg total) by mouth 3 (three) times daily as needed for anxiety. 60 tablet 0  . amLODipine (NORVASC) 10 MG tablet TAKE 1 TABLET BY MOUTH EVERY DAY 90 tablet 3  . atorvastatin (LIPITOR) 40 MG tablet TAKE 1 TABLET BY MOUTH  EVERY DAY 90 tablet 1  . budesonide-formoterol (SYMBICORT) 160-4.5 MCG/ACT inhaler Inhale 2 puffs into the lungs 2 (two) times daily.      . cetirizine (ZYRTEC) 10 MG tablet Take 10 mg by mouth daily.    . fluticasone (FLONASE) 50 MCG/ACT nasal spray PLACE 2 SPRAYS INTO THE NOSTRIL(S) DAILY 48 g 3  . losartan (COZAAR) 100 MG tablet TAKE 1 TABLET BY MOUTH EVERY DAY 90 tablet 2  . metoprolol succinate (TOPROL-XL) 25 MG 24 hr tablet TAKE 1 TABLET BY MOUTH EVERY DAY 90 tablet 3  . Multiple Vitamin (MULTIVITAMIN) tablet Take 1 tablet by mouth daily.    . niacin (NIASPAN) 1000 MG CR tablet TAKE 1 TABLET (1,000 MG TOTAL) BY MOUTH AT BEDTIME. 30 tablet 5  . sildenafil (VIAGRA) 100 MG tablet TAKE 1/2 TO 1 TABLETS BY MOUTH DAILY AS NEEDED FOR ERECTILE DYSFUNCTION. 5 tablet 0   No current facility-administered medications on file prior to visit.    Allergies  Allergen Reactions  . Contrast Media [Iodinated Diagnostic Agents] Swelling    Allergic reaction 1987, ok w/ 13 hr prep//a.c.   Social History   Socioeconomic History  . Marital status: Married    Spouse name: Not on file  . Number of children: Not on file  . Years of education: Not on file  . Highest education level: Not on file  Occupational History  . Occupation: Tourist information centre manager    Employer: [employer]  . Financial resource strain: Not on file  . Food insecurity:    Worry: Not on file    Inability: Not on file  . Transportation needs:    Medical: Not on file    Non-medical: Not on file  Tobacco Use  . Smoking status: Former Smoker    Types: Cigarettes    Last attempt to quit: 12/29/2010    Years since quitting: 6.9  . Smokeless tobacco: Never Used  Substance and Sexual Activity  . Alcohol use: Yes  . Drug use: No  . Sexual activity: Not on file  Lifestyle  . Physical activity:    Days per week: Not on file    Minutes per session: Not on file  . Stress: Not on file  Relationships  . Social connections:     Talks on phone: Not on file    Gets together: Not on file    Attends religious service: Not on file    Active member of club or organization: Not on file    Attends meetings of clubs or organizations: Not on file    Relationship status: Not on file  . Intimate partner violence:    Fear of current or ex partner: Not on file    Emotionally abused: Not on file  Physically abused: Not on file    Forced sexual activity: Not on file  Other Topics Concern  . Not on file  Social History Narrative    He is married.  He works in Water quality scientist.  He smoked 1 pack of cigarettes a day for the last 35     years.  He has occasional alcohol use.      Review of Systems  All other systems reviewed and are negative.      Objective:   Physical Exam  Neck: Neck supple. No JVD present. No thyromegaly present.  Cardiovascular: Normal rate and normal heart sounds.  No murmur heard. Pulmonary/Chest: Effort normal and breath sounds normal. No respiratory distress. He has no wheezes. He has no rales.  Abdominal: Soft. Bowel sounds are normal. He exhibits no distension. There is no tenderness. There is no rebound.  Musculoskeletal: He exhibits no edema.  Vitals reviewed.         Assessment & Plan:  Near syncope - Plan: EKG 12-Lead, ECHOCARDIOGRAM COMPLETE, CBC with Differential/Platelet, COMPLETE METABOLIC PANEL WITH GFR  I see 2 issues.  First his blood pressure seems to be elevated.  I would add hydrochlorothiazide 25 mg a day to amlodipine, losartan, and metoprolol and recheck blood pressure over the next 2 to 3 weeks.  However the second issue concerns me more and that the near syncopal episode.  Patient was not nervous.  There was really no reason for this to happen.  Therefore I would arrange the patient to have a Holter monitor to evaluate for cardiac arrhythmias, and echocardiogram to evaluate for any evidence of cardiomyopathy, depressed ejection fraction, valvular  abnormalities.  I will obtain a CBC to evaluate for anemia as well as a CMP to evaluate for letter light disturbances.  I will obtain an EKG to evaluate for any evidence that may suggest increased risk for cardiac arrhythmias such as prolonged QTc etc.  EKG today looks completely normal.  It shows normal sinus rhythm with normal intervals and a normal axis with no evidence of ischemia or infarction.  Check in 3 weeks and await results of Holter monitor and echocardiogram

## 2017-12-07 ENCOUNTER — Telehealth: Payer: Self-pay | Admitting: Family Medicine

## 2017-12-07 NOTE — Telephone Encounter (Signed)
Called to inform pt of lab results and he states that when he checks his BP that his BP is anywhere from 15-30 points higher in his rt arm then his left and wanted to know if there was any concern about blockages? (he googled it and that it what it said could be the reason for the different readings)

## 2017-12-08 DIAGNOSIS — M9901 Segmental and somatic dysfunction of cervical region: Secondary | ICD-10-CM | POA: Diagnosis not present

## 2017-12-08 DIAGNOSIS — M545 Low back pain: Secondary | ICD-10-CM | POA: Diagnosis not present

## 2017-12-08 DIAGNOSIS — M9902 Segmental and somatic dysfunction of thoracic region: Secondary | ICD-10-CM | POA: Diagnosis not present

## 2017-12-08 DIAGNOSIS — M9903 Segmental and somatic dysfunction of lumbar region: Secondary | ICD-10-CM | POA: Diagnosis not present

## 2017-12-09 ENCOUNTER — Telehealth: Payer: Self-pay | Admitting: Family Medicine

## 2017-12-09 ENCOUNTER — Ambulatory Visit: Payer: Medicare Other

## 2017-12-09 VITALS — BP 130/74

## 2017-12-09 DIAGNOSIS — Z0130 Encounter for examination of blood pressure without abnormal findings: Secondary | ICD-10-CM

## 2017-12-09 NOTE — Telephone Encounter (Signed)
Pt now states that since his BP has gone down it is only 15 points difference. Asked pt is he would like to come by here and have [place] check it manually before we order the dopplers and he will drop by this afternoon to have checked.

## 2017-12-09 NOTE — Telephone Encounter (Signed)
Patient came in today for a blood pressure check stating that he is getting different readings in each arm and blood pressures were 30 points difference. Checked patient's blood pressure and it was 1107/72 in the left arm at rest with a normal size cuff, and 130/74 in the left arm at rest with a normal size cuff. Patient is scheduled for a echocardiogram on 12/14/17. Will forward this message to patient's PCP.  Please advise?

## 2017-12-09 NOTE — Patient Instructions (Signed)
Echocardiogram An echocardiogram, or echocardiography, uses sound waves (ultrasound) to produce an image of your heart. The echocardiogram is simple, painless, obtained within a short period of time, and offers valuable information to your health care provider. The images from an echocardiogram can provide information such as:  Evidence of coronary artery disease (CAD).  Heart size.  Heart muscle function.  Heart valve function.  Aneurysm detection.  Evidence of a past heart attack.  Fluid buildup around the heart.  Heart muscle thickening.  Assess heart valve function.  Tell a health care provider about:  Any allergies you have.  All medicines you are taking, including vitamins, herbs, eye drops, creams, and over-the-counter medicines.  Any problems you or family members have had with anesthetic medicines.  Any blood disorders you have.  Any surgeries you have had.  Any medical conditions you have.  Whether you are pregnant or may be pregnant. What happens before the procedure? No special preparation is needed. Eat and drink normally. What happens during the procedure?  In order to produce an image of your heart, gel will be applied to your chest and a wand-like tool (transducer) will be moved over your chest. The gel will help transmit the sound waves from the transducer. The sound waves will harmlessly bounce off your heart to allow the heart images to be captured in real-time motion. These images will then be recorded.  You may need an IV to receive a medicine that improves the quality of the pictures. What happens after the procedure? You may return to your normal schedule including diet, activities, and medicines, unless your health care provider tells you otherwise. This information is not intended to replace advice given to you by your health care provider. Make sure you discuss any questions you have with your health care provider. Document Released: 04/10/2000  Document Revised: 11/30/2015 Document Reviewed: 12/19/2012 Elsevier Interactive Patient Education  2017 Elsevier Inc.  

## 2017-12-09 NOTE — Progress Notes (Signed)
Patient came in today for a blood pressure check stating that he is getting different readings in each arm and blood pressures were 30 points difference. Checked patient's blood pressure and it was 1107/72 in the left arm at rest with a normal size cuff, and 130/74 in the left arm at rest with a normal size cuff. Patient is scheduled for a echocardiogram on 12/14/17. Will forward this message to patient's PCP.

## 2017-12-09 NOTE — Telephone Encounter (Signed)
Could suggest a blockage.  I would recommend checking arterial us/dopplers of the upper extremities to look for blockage.

## 2017-12-10 ENCOUNTER — Other Ambulatory Visit: Payer: Self-pay | Admitting: Family Medicine

## 2017-12-10 DIAGNOSIS — I998 Other disorder of circulatory system: Secondary | ICD-10-CM

## 2017-12-10 NOTE — Telephone Encounter (Signed)
Pt aware and dopplers ordered

## 2017-12-10 NOTE — Telephone Encounter (Signed)
Need to schedule arterial dopplers of the upper extremities to evaluate for a blockage.  Please notify patient.

## 2017-12-13 ENCOUNTER — Other Ambulatory Visit: Payer: Self-pay | Admitting: Family Medicine

## 2017-12-13 ENCOUNTER — Encounter: Payer: Self-pay | Admitting: Physician Assistant

## 2017-12-13 ENCOUNTER — Ambulatory Visit: Payer: Medicare Other

## 2017-12-13 DIAGNOSIS — R55 Syncope and collapse: Secondary | ICD-10-CM | POA: Diagnosis not present

## 2017-12-13 DIAGNOSIS — I998 Other disorder of circulatory system: Secondary | ICD-10-CM

## 2017-12-13 NOTE — Progress Notes (Signed)
Spoke with patient and informed him of appointment for arterial doppler tomorrow at 12/14/17 at 1430 at [facility] Patient verbalized understanding.

## 2017-12-13 NOTE — Progress Notes (Signed)
Patient came in today to have holter monitor placed. Holter Monitor placed and patient tolerated well.

## 2017-12-14 ENCOUNTER — Ambulatory Visit (HOSPITAL_BASED_OUTPATIENT_CLINIC_OR_DEPARTMENT_OTHER): Payer: Medicare Other

## 2017-12-14 ENCOUNTER — Other Ambulatory Visit: Payer: Self-pay | Admitting: Family Medicine

## 2017-12-14 ENCOUNTER — Ambulatory Visit (HOSPITAL_COMMUNITY)
Admission: RE | Admit: 2017-12-14 | Discharge: 2017-12-14 | Disposition: A | Discharge status: Home / Self Care | Payer: Medicare Other | Source: Ambulatory Visit | Attending: Cardiovascular Disease | Admitting: Cardiovascular Disease

## 2017-12-14 DIAGNOSIS — I998 Other disorder of circulatory system: Secondary | ICD-10-CM | POA: Diagnosis not present

## 2017-12-14 DIAGNOSIS — R55 Syncope and collapse: Secondary | ICD-10-CM

## 2017-12-20 ENCOUNTER — Ambulatory Visit (INDEPENDENT_AMBULATORY_CARE_PROVIDER_SITE_OTHER): Payer: Medicare Other | Admitting: Cardiology

## 2017-12-20 ENCOUNTER — Encounter: Payer: Self-pay | Admitting: Cardiology

## 2017-12-20 VITALS — BP 114/70 | HR 87 | Ht 72.0 in | Wt 230.6 lb

## 2017-12-20 DIAGNOSIS — Z720 Tobacco use: Secondary | ICD-10-CM | POA: Diagnosis not present

## 2017-12-20 DIAGNOSIS — F17210 Nicotine dependence, cigarettes, uncomplicated: Secondary | ICD-10-CM

## 2017-12-20 DIAGNOSIS — R55 Syncope and collapse: Secondary | ICD-10-CM | POA: Diagnosis not present

## 2017-12-20 NOTE — Patient Instructions (Addendum)
Medication Instructions:  The current medical regimen is effective;  continue present plan and medications.  Please continue to monitor you blood pressures at home.  Please obtain a new blood pressure cuff to check these with and make Dr Marlou Porch aware if you are continuing to have differences in blood pressures in each arm.  Follow-Up: Follow up as needed with Dr Marlou Porch.  If you need a refill on your cardiac medications before your next appointment, please call your pharmacy.  Thank you for choosing [facility]!!     Coping with Quitting Smoking Quitting smoking is a physical and mental challenge. You will face cravings, withdrawal symptoms, and temptation. Before quitting, work with your health care provider to make a plan that can help you cope. Preparation can help you quit and keep you from giving in. How can I cope with cravings? Cravings usually last for 5-10 minutes. If you get through it, the craving will pass. Consider taking the following actions to help you cope with cravings:  Keep your mouth busy: ? Chew sugar-free gum. ? Suck on hard candies or a straw. ? Brush your teeth.  Keep your hands and body busy: ? Immediately change to a different activity when you feel a craving. ? Squeeze or play with a ball. ? Do an activity or a hobby, like making bead jewelry, practicing needlepoint, or working with wood. ? Mix up your normal routine. ? Take a short exercise break. Go for a quick walk or run up and down stairs. ? Spend time in public places where smoking is not allowed.  Focus on doing something kind or helpful for someone else.  Call a friend or family member to talk during a craving.  Join a support group.  Call a quit line, such as 1-800-QUIT-NOW.  Talk with your health care provider about medicines that might help you cope with cravings and make quitting easier for you.  How can I deal with withdrawal symptoms? Your body may experience negative  effects as it tries to get used to not having nicotine in the system. These effects are called withdrawal symptoms. They may include:  Feeling hungrier than normal.  Trouble concentrating.  Irritability.  Trouble sleeping.  Feeling depressed.  Restlessness and agitation.  Craving a cigarette.  To manage withdrawal symptoms:  Avoid places, people, and activities that trigger your cravings.  Remember why you want to quit.  Get plenty of sleep.  Avoid coffee and other caffeinated drinks. These may worsen some of your symptoms.  How can I handle social situations? Social situations can be difficult when you are quitting smoking, especially in the first few weeks. To manage this, you can:  Avoid parties, bars, and other social situations where people might be smoking.  Avoid alcohol.  Leave right away if you have the urge to smoke.  Explain to your family and friends that you are quitting smoking. Ask for understanding and support.  Plan activities with friends or family where smoking is not an option.  What are some ways I can cope with stress? Wanting to smoke may cause stress, and stress can make you want to smoke. Find ways to manage your stress. Relaxation techniques can help. For example:  Breathe slowly and deeply, in through your nose and out through your mouth.  Listen to soothing, relaxing music.  Talk with a family member or friend about your stress.  Light a candle.  Soak in a bath or take a shower.  Think about  a peaceful place.  What are some ways I can prevent weight gain? Be aware that many people gain weight after they quit smoking. However, not everyone does. To keep from gaining weight, have a plan in place before you quit and stick to the plan after you quit. Your plan should include:  Having healthy snacks. When you have a craving, it may help to: ? Eat plain popcorn, crunchy carrots, celery, or other cut vegetables. ? Chew sugar-free  gum.  Changing how you eat: ? Eat small portion sizes at meals. ? Eat 4-6 small meals throughout the day instead of 1-2 large meals a day. ? Be mindful when you eat. Do not watch television or do other things that might distract you as you eat.  Exercising regularly: ? Make time to exercise each day. If you do not have time for a long workout, do short bouts of exercise for 5-10 minutes several times a day. ? Do some form of strengthening exercise, like weight lifting, and some form of aerobic exercise, like running or swimming.  Drinking plenty of water or other low-calorie or no-calorie drinks. Drink 6-8 glasses of water daily, or as much as instructed by your health care provider.  Summary  Quitting smoking is a physical and mental challenge. You will face cravings, withdrawal symptoms, and temptation to smoke again. Preparation can help you as you go through these challenges.  You can cope with cravings by keeping your mouth busy (such as by chewing gum), keeping your body and hands busy, and making calls to family, friends, or a helpline for people who want to quit smoking.  You can cope with withdrawal symptoms by avoiding places where people smoke, avoiding drinks with caffeine, and getting plenty of rest.  Ask your health care provider about the different ways to prevent weight gain, avoid stress, and handle social situations. This information is not intended to replace advice given to you by your health care provider. Make sure you discuss any questions you have with your health care provider. Document Released: 04/10/2016 Document Revised: 04/10/2016 Document Reviewed: 04/10/2016 Elsevier Interactive Patient Education  [employer].

## 2017-12-20 NOTE — Progress Notes (Addendum)
Cardiology Office Note:    Date:  12/20/2017   ID:  Vincent Short, DOB [birth year], MRN [id number]  PCP:  Vincent Frizzle, MD  Cardiologist:  No primary care provider on file.   Referring MD: Vincent Frizzle, MD     History of Present Illness:    Vincent Short is a 67 y.o. male here for the evaluation of syncope at the request of Dr. Dennard Short.  In review of his prior note from 12/06/2017, he had an unusual episode when standing in line at a [employer].  He turned to get his drink and suddenly felt as though he was going to faint.  He felt as though everything was going black.  After holding onto the drink machine to keep from falling the symptoms improved over 10 to 20 seconds.  This is a first-time experience for him.  Did not recall having any chest pain, shortness of breath, palpitations.  No prior history of syncope, MI, orthopnea, PND.  At home his right arm blood pressure runs higher than left prior to this visit, his blood pressures were between 673 419 systolic.  HCTZ 25 mg was added to his amlodipine, losartan, metoprolol. Black with light in the middle. No nausea. No diaphoresis. No pain. Let was lower BP.   Yesterday was a little dizzy standing up. Few of them in past weeks. Prior in church after standing up.   Years ago, Dr. Claiborne Short saw him, chest pain, heart cath was OK. Mentioned vasospasm.   Tobacco use. Quit for 3 year.   Mother is 1. Father died 47 (MI)  OSA - CPAP (Dr. Claiborne Short)   Past Medical History:  Diagnosis Date  . Allergic rhinitis   . Asthma   . Atelectasis    Rt upper lobe  . COPD (chronic obstructive pulmonary disease) ([place])   . GERD (gastroesophageal reflux disease)   . History of colon polyps 03/14/2009   Tubular adenomatous and Hyperplastic polyps  . HTN (hypertension)   . Hyperlipidemia   . Lung mass    noted on CT scan(12/24/10)   . S/P cardiac cath    negative  . Tobacco abuse   . Treadmill stress test negative for angina  pectoris    Dr. Claiborne Short    Past Surgical History:  Procedure Laterality Date  . Arthroscopy left wrist with radial styloidectomy.  07/06/2000   Dr Fredna Dow  . Bronchoscopy with airway inspection, brush cytology,  11/22/2006   Dr Halford Chessman  . CARDIAC CATHETERIZATION  07/05/2002   Dr Horton Marshall LVF, SMOOTH 10% TO 20% OSTIAL AND 10%-20% LUMINAL IRREGULARITY OF THE PROXIMAL LAD, SMOOTH 30%-40% PROXIMAL RCA,WHICH IMPROVED FOLLOWING INTRACORONARY NTG ADMINISTRATION SUGGESTING CORONARY VASOSPASM. NO AORTOILIAC DISEASE.  Vincent Short DOPPLER ECHOCARDIOGRAPHY N/A 06/22/08   LV SIZE IS NORMAL. LV FUNCTION IS NORMAL. EF=>55%. THERE IS BORDERLINE CONCENTRIC LVH.  Vincent Short FRACTURE SURGERY     Left wrist scaphoid fx  . FRACTURE SURGERY     Left ankle surgery  . Left ankle surgery  11/11/2005   Dr Vincent Short  . LUNG SURGERY  01/21/11   Dr. Cyndia Short   FOB, R THORACOTOMY WITH RIGHT UPPER LOBECTOMY  . LUNG SURGERY  01/21/11   FOB, R THORACOTOMY WITH RIGHT UPPER LOBECTOMY  . NASAL SINUS SURGERY  1990  . NUCLEAR STRESS TEST N/A 09/09/10   NORMAL PATTERN OF PERFUSION IN ALL REGIONS. EF IS 63%. NO WALL MOTION ABNORMALITIES.  Vincent Short ROTATOR CUFF REPAIR  pending   Dr. Noemi Short  . TONSILLECTOMY  Current Medications: Current Meds  Medication Sig  . albuterol (PROVENTIL HFA;VENTOLIN HFA) 108 (90 BASE) MCG/ACT inhaler Inhale 2 puffs into the lungs every 6 (six) hours as needed for wheezing or shortness of breath.  Vincent Short albuterol (PROVENTIL) (2.5 MG/3ML) 0.083% nebulizer solution Take 3 mLs (2.5 mg total) by nebulization every 6 (six) hours as needed for wheezing or shortness of breath.  . ALPRAZolam (XANAX) 0.5 MG tablet Take 1 tablet (0.5 mg total) by mouth 3 (three) times daily as needed for anxiety.  Vincent Short amLODipine (NORVASC) 10 MG tablet TAKE 1 TABLET BY MOUTH EVERY DAY  . atorvastatin (LIPITOR) 40 MG tablet TAKE 1 TABLET BY MOUTH EVERY DAY  . budesonide-formoterol (SYMBICORT) 160-4.5 MCG/ACT inhaler Inhale 2 puffs into the lungs 2 (two) times  daily.    . cetirizine (ZYRTEC) 10 MG tablet Take 10 mg by mouth daily.  . fluticasone (FLONASE) 50 MCG/ACT nasal spray PLACE 2 SPRAYS INTO THE NOSTRIL(S) DAILY  . hydrochlorothiazide (HYDRODIURIL) 25 MG tablet Take 1 tablet (25 mg total) by mouth daily.  Vincent Short losartan (COZAAR) 100 MG tablet TAKE 1 TABLET BY MOUTH EVERY DAY  . metoprolol succinate (TOPROL-XL) 25 MG 24 hr tablet TAKE 1 TABLET BY MOUTH EVERY DAY  . Multiple Vitamin (MULTIVITAMIN) tablet Take 1 tablet by mouth daily.  . niacin (NIASPAN) 1000 MG CR tablet TAKE 1 TABLET (1,000 MG TOTAL) BY MOUTH AT BEDTIME.  . sildenafil (VIAGRA) 100 MG tablet TAKE 1/2 TO 1 TABLETS BY MOUTH DAILY AS NEEDED FOR ERECTILE DYSFUNCTION.     Allergies:   Contrast media [iodinated diagnostic agents]   Social History   Socioeconomic History  . Marital status: Married    Spouse name: Not on file  . Number of children: Not on file  . Years of education: Not on file  . Highest education level: Not on file  Occupational History  . Occupation: Tourist information centre manager    Employer: [employer]  . Financial resource strain: Not on file  . Food insecurity:    Worry: Not on file    Inability: Not on file  . Transportation needs:    Medical: Not on file    Non-medical: Not on file  Tobacco Use  . Smoking status: Former Smoker    Types: Cigarettes    Last attempt to quit: 12/29/2010    Years since quitting: 6.9  . Smokeless tobacco: Never Used  Substance and Sexual Activity  . Alcohol use: Yes  . Drug use: No  . Sexual activity: Not on file  Lifestyle  . Physical activity:    Days per week: Not on file    Minutes per session: Not on file  . Stress: Not on file  Relationships  . Social connections:    Talks on phone: Not on file    Gets together: Not on file    Attends religious service: Not on file    Active member of club or organization: Not on file    Attends meetings of clubs or organizations: Not on file    Relationship status:  Not on file  Other Topics Concern  . Not on file  Social History Narrative    He is married.  He works in Water quality scientist.  He smoked 1 pack of cigarettes a day for the last 35     years.  He has occasional alcohol use.      Family History: The patient's family history includes Allergic  rhinitis in his father and mother; Coronary artery disease in his father and mother; Heart disease in his father and mother; Kidney disease in his mother; Stroke in his mother. ROS:   Please see the history of present illness.     All other systems reviewed and are negative.  EKGs/Labs/Other Studies Reviewed:    The following studies were reviewed today:  ECHO 12/14/17 - Left ventricle: The cavity size was normal. There was mild   concentric hypertrophy. Systolic function was normal. The   estimated ejection fraction was in the range of 55% to 60%. Wall   motion was normal; there were no regional wall motion   abnormalities. Doppler parameters are consistent with abnormal   left ventricular relaxation (grade 1 diastolic dysfunction). - Aortic valve: Noncoronary cusp mobility was mildly restricted. - Left atrium: The atrium was mildly dilated. - Atrial septum: No defect or patent foramen ovale was identified.  EKG: 12/06/2017-sinus rhythm normal intervals, J-point elevation V2 and V3.  Normal variant.  Recent Labs: 12/06/2017: ALT 23; BUN 20; Creat 1.03; Hemoglobin 15.4; Platelets 301; Potassium 5.0; Sodium 140  Recent Lipid Panel    Component Value Date/Time   CHOL 155 02/25/2017 1035   TRIG 175 (H) 02/25/2017 1035   HDL 34 (L) 02/25/2017 1035   CHOLHDL 4.6 02/25/2017 1035   VLDL 39 (H) 08/28/2016 0849   LDLCALC 94 02/25/2017 1035    Physical Exam:    VS:  BP 114/70   Pulse 87   Ht 6' (1.829 m)   Wt 230 lb 9.6 oz (104.6 kg)   SpO2 94%   BMI 31.27 kg/m     Wt Readings from Last 3 Encounters:  12/20/17 230 lb 9.6 oz (104.6 kg)  12/06/17 228 lb (103.4 kg)  08/25/17  225 lb (102.1 kg)     GEN:  Well nourished, well developed in no acute distress, overweight HEENT: Normal NECK: No JVD; No carotid bruits LYMPHATICS: No lymphadenopathy CARDIAC: RRR, no murmurs, rubs, gallops RESPIRATORY:  Clear to auscultation without rales, wheezing or rhonchi  ABDOMEN: Soft, non-tender, non-distended MUSCULOSKELETAL:  No edema; No deformity  SKIN: Warm and dry NEUROLOGIC:  Alert and oriented x 3 PSYCHIATRIC:  Normal affect   ASSESSMENT:    1. Near syncope   2. Tobacco use    PLAN:    In order of problems listed above:  Near syncope - Echocardiogram reassuring with normal structure and function.  EKG also reassuring with normal intervals.  No frank syncope.  Awaiting Holter results.  Dopplers in both arms were equal when tested.  Most common reason for near syncope is transient decrease in blood pressure.  It was in the morning, he had had one cup of coffee, perhaps there was a degree of intravascular volume depletion and his symptoms occurred.  He understands that if he begins to have the sensation again, he is to sit down/lay down with his feet elevated.  If these become a recurring theme, we will likely place a 30-day event monitor.  I am comfortable at this point with him trying to exercise.  He had no associated anginal symptoms.  No palpitations.  He may have felt some mild dizziness when getting up from a seated position at church previously.  Once again, could be labile blood pressures.  Essential hypertension -Seem to be well-controlled today.  Interestingly, his vascular Dopplers done on bilateral upper extremities did not show any evidence of discrepancy between left and right blood pressures.  I have asked  him to purchase a new blood pressure cuff, arm cuff.  I had Pam check his blood pressures here again and his left was 118/82, right was 126/80-no significant difference.  Tobacco use - Counseled on tobacco cessation for approximately 4 minutes today.   Try not to true Nicorette gum and smoke at the same time.  At one point he was able to quit for 3 years.  High stress last year caused him to increase his smoking.  Obesity -Continue to encourage weight loss.   Medication Adjustments/Labs and Tests Ordered: Current medicines are reviewed at length with the patient today.  Concerns regarding medicines are outlined above.  No orders of the defined types were placed in this encounter.  No orders of the defined types were placed in this encounter.   Patient Instructions  Medication Instructions:  The current medical regimen is effective;  continue present plan and medications.  Please continue to monitor you blood pressures at home.  Please obtain a new blood pressure cuff to check these with and make Dr Marlou Porch aware if you are continuing to have differences in blood pressures in each arm.  Follow-Up: Follow up as needed with Dr Marlou Porch.  If you need a refill on your cardiac medications before your next appointment, please call your pharmacy.  Thank you for choosing [facility]!!     Coping with Quitting Smoking Quitting smoking is a physical and mental challenge. You will face cravings, withdrawal symptoms, and temptation. Before quitting, work with your health care provider to make a plan that can help you cope. Preparation can help you quit and keep you from giving in. How can I cope with cravings? Cravings usually last for 5-10 minutes. If you get through it, the craving will pass. Consider taking the following actions to help you cope with cravings:  Keep your mouth busy: ? Chew sugar-free gum. ? Suck on hard candies or a straw. ? Brush your teeth.  Keep your hands and body busy: ? Immediately change to a different activity when you feel a craving. ? Squeeze or play with a ball. ? Do an activity or a hobby, like making bead jewelry, practicing needlepoint, or working with wood. ? Mix up your normal  routine. ? Take a short exercise break. Go for a quick walk or run up and down stairs. ? Spend time in public places where smoking is not allowed.  Focus on doing something kind or helpful for someone else.  Call a friend or family member to talk during a craving.  Join a support group.  Call a quit line, such as 1-800-QUIT-NOW.  Talk with your health care provider about medicines that might help you cope with cravings and make quitting easier for you.  How can I deal with withdrawal symptoms? Your body may experience negative effects as it tries to get used to not having nicotine in the system. These effects are called withdrawal symptoms. They may include:  Feeling hungrier than normal.  Trouble concentrating.  Irritability.  Trouble sleeping.  Feeling depressed.  Restlessness and agitation.  Craving a cigarette.  To manage withdrawal symptoms:  Avoid places, people, and activities that trigger your cravings.  Remember why you want to quit.  Get plenty of sleep.  Avoid coffee and other caffeinated drinks. These may worsen some of your symptoms.  How can I handle social situations? Social situations can be difficult when you are quitting smoking, especially in the first few weeks. To manage this, you can:  Avoid parties, bars, and other social situations where people might be smoking.  Avoid alcohol.  Leave right away if you have the urge to smoke.  Explain to your family and friends that you are quitting smoking. Ask for understanding and support.  Plan activities with friends or family where smoking is not an option.  What are some ways I can cope with stress? Wanting to smoke may cause stress, and stress can make you want to smoke. Find ways to manage your stress. Relaxation techniques can help. For example:  Breathe slowly and deeply, in through your nose and out through your mouth.  Listen to soothing, relaxing music.  Talk with a family member or  friend about your stress.  Light a candle.  Soak in a bath or take a shower.  Think about a peaceful place.  What are some ways I can prevent weight gain? Be aware that many people gain weight after they quit smoking. However, not everyone does. To keep from gaining weight, have a plan in place before you quit and stick to the plan after you quit. Your plan should include:  Having healthy snacks. When you have a craving, it may help to: ? Eat plain popcorn, crunchy carrots, celery, or other cut vegetables. ? Chew sugar-free gum.  Changing how you eat: ? Eat small portion sizes at meals. ? Eat 4-6 small meals throughout the day instead of 1-2 large meals a day. ? Be mindful when you eat. Do not watch television or do other things that might distract you as you eat.  Exercising regularly: ? Make time to exercise each day. If you do not have time for a long workout, do short bouts of exercise for 5-10 minutes several times a day. ? Do some form of strengthening exercise, like weight lifting, and some form of aerobic exercise, like running or swimming.  Drinking plenty of water or other low-calorie or no-calorie drinks. Drink 6-8 glasses of water daily, or as much as instructed by your health care provider.  Summary  Quitting smoking is a physical and mental challenge. You will face cravings, withdrawal symptoms, and temptation to smoke again. Preparation can help you as you go through these challenges.  You can cope with cravings by keeping your mouth busy (such as by chewing gum), keeping your body and hands busy, and making calls to family, friends, or a helpline for people who want to quit smoking.  You can cope with withdrawal symptoms by avoiding places where people smoke, avoiding drinks with caffeine, and getting plenty of rest.  Ask your health care provider about the different ways to prevent weight gain, avoid stress, and handle social situations. This information is not  intended to replace advice given to you by your health care provider. Make sure you discuss any questions you have with your health care provider. Document Released: 04/10/2016 Document Revised: 04/10/2016 Document Reviewed: 04/10/2016 Elsevier Interactive Patient Education  2018 [place], MD  12/20/2017 10:18 AM    [place]

## 2017-12-23 ENCOUNTER — Telehealth: Payer: Self-pay | Admitting: Family Medicine

## 2017-12-23 NOTE — Telephone Encounter (Signed)
Per Dr. Dennard Schaumann Holter monitor is good news - no dangerous arrhythmia's are seen. No cause for syncope. Pt aware of results.  Pt also states that since his BP was better he stopped the HCTZ and his BP'S are doing much better now.

## 2017-12-24 NOTE — Telephone Encounter (Signed)
Noted  

## 2017-12-28 ENCOUNTER — Ambulatory Visit: Payer: Medicare Other | Admitting: Family Medicine

## 2018-03-04 ENCOUNTER — Ambulatory Visit (INDEPENDENT_AMBULATORY_CARE_PROVIDER_SITE_OTHER): Payer: Medicare Other

## 2018-03-04 DIAGNOSIS — Z23 Encounter for immunization: Secondary | ICD-10-CM

## 2018-03-04 NOTE — Progress Notes (Signed)
Patient was in office for high dose flu vaccine.Patient received vaccine in his left deltoid patient tolerated well. 

## 2018-03-11 ENCOUNTER — Other Ambulatory Visit: Payer: Self-pay

## 2018-03-30 ENCOUNTER — Encounter: Payer: Self-pay | Admitting: Family Medicine

## 2018-03-30 ENCOUNTER — Ambulatory Visit (INDEPENDENT_AMBULATORY_CARE_PROVIDER_SITE_OTHER): Payer: Medicare Other | Admitting: Family Medicine

## 2018-03-30 ENCOUNTER — Other Ambulatory Visit: Payer: Self-pay

## 2018-03-30 DIAGNOSIS — J441 Chronic obstructive pulmonary disease with (acute) exacerbation: Secondary | ICD-10-CM | POA: Diagnosis not present

## 2018-03-30 MED ORDER — ALBUTEROL SULFATE (2.5 MG/3ML) 0.083% IN NEBU: 2.5000 mg | INHALATION_SOLUTION | Freq: Four times a day (QID) | RESPIRATORY_TRACT | 1 refills | Status: DC | PRN

## 2018-03-30 MED ORDER — PREDNISONE 10 MG PO TABS: ORAL_TABLET | ORAL | 0 refills | Status: DC

## 2018-03-30 MED ORDER — ALBUTEROL SULFATE HFA 108 (90 BASE) MCG/ACT IN AERS: 2.0000 | INHALATION_SPRAY | Freq: Four times a day (QID) | RESPIRATORY_TRACT | 5 refills | Status: DC | PRN

## 2018-03-30 MED ORDER — AZITHROMYCIN 250 MG PO TABS: ORAL_TABLET | ORAL | 0 refills | Status: DC

## 2018-03-30 NOTE — Patient Instructions (Addendum)
F/U as needed  Take both antibiotics Prednisone Use nebilizer Continue mucinex

## 2018-03-30 NOTE — Progress Notes (Signed)
   Subjective:    Patient ID: Vincent Short, male    DOB: May 15, 1950, 67 y.o.   MRN: [id number]  Patient presents for Illness (x3 days- productive cough with yellow to white mucus- has had partial lung removal)  Pt here with cough with production, wheezing, tightness ofr the past 3 days, history of COPD on symbicort, needs new albuterol. Has not used any nebulizer recently. HIstory of PNA, recurrent lung infections, had partial lung removal years ago, no fever, NO GI symptoms Currently on amoxicillin due to dental abscess Non smoker    Review Of Systems:  GEN- denies fatigue, fever, weight loss,weakness, recent illness HEENT- denies eye drainage, change in vision, nasal discharge, CVS- denies chest pain, palpitations RESP- denies SOB,+ cough,+ wheeze ABD- denies N/V, change in stools, abd pain GU- denies dysuria, hematuria, dribbling, incontinence MSK- denies joint pain, muscle aches, injury Neuro- denies headache, dizziness, syncope, seizure activity       Objective:    BP 116/82   Pulse 80   Temp 98.3 F (36.8 C) (Oral)   Resp 18   Ht 6' (1.829 m)   Wt 230 lb (104.3 kg)   SpO2 96%   BMI 31.19 kg/m  GEN- NAD, alert and oriented x3 HEENT- PERRL, EOMI, non injected sclera, pink conjunctiva, MMM, oropharynx clear. Nares clear Neck- Supple, no LAD CVS- RRR, no murmur RESP-rhonchi, congested, scattered wheezing expiratory, normal WOB at rest EXT- No edema Pulses- Radial 2+         Assessment & Plan:      Problem List Items Addressed This Visit    None    Visit Diagnoses    COPD exacerbation (HCC)       Add azithromycin to his amox to cover atypicals has quite a pulmonary infection history, and lung surgery, prednisone, albuterol nebs, mucinex   Relevant Medications   predniSONE (DELTASONE) 10 MG tablet   azithromycin (ZITHROMAX) 250 MG tablet   albuterol (PROVENTIL HFA;VENTOLIN HFA) 108 (90 Base) MCG/ACT inhaler   albuterol (PROVENTIL) (2.5 MG/3ML) 0.083%  nebulizer solution      Note: This dictation was prepared with Dragon dictation along with smaller phrase technology. Any transcriptional errors that result from this process are unintentional.

## 2018-04-03 DIAGNOSIS — R062 Wheezing: Secondary | ICD-10-CM | POA: Diagnosis not present

## 2018-04-03 DIAGNOSIS — J42 Unspecified chronic bronchitis: Secondary | ICD-10-CM | POA: Diagnosis not present

## 2018-04-07 ENCOUNTER — Other Ambulatory Visit: Payer: Self-pay | Admitting: Family Medicine

## 2018-04-07 ENCOUNTER — Telehealth: Payer: Self-pay | Admitting: Family Medicine

## 2018-04-07 MED ORDER — PREDNISONE 20 MG PO TABS: ORAL_TABLET | ORAL | 0 refills | Status: DC

## 2018-04-07 NOTE — Telephone Encounter (Signed)
Call placed to patient to obtain more information.   Reports that he was given Z-Pack and Prednisone on 03/30/2018 for COPD exacerbation. States that by 04/01/2018, he was feeling much improved. He completed ABTx and Prednisone and has since regressed. Patient went to [facility] and had CXR done that was clear over weekend. States that he was given cough medication.   Reports that he has not been running fever, but he continues to have productive cough with yellow colored sputum. States that he would like to extend Prednisone as he feels this is more effective.   Patient is concerned since he only has (1) working lung.  MD please advise.

## 2018-04-07 NOTE — Telephone Encounter (Signed)
I will send prednisone taper pack.

## 2018-04-07 NOTE — Telephone Encounter (Signed)
Pt seen on 03/30/2018 for sick visit with [place] states he was okay for like 2 days and then started getting progressively worse. He does not want to schedule another visit to come in because of the weather tomorrow, and would like for [place] to call in more antibiotic to the pharmacy for him - vm was left on sandy vm 04/07/2018.   Did not specify pharmacy.

## 2018-04-07 NOTE — Telephone Encounter (Signed)
Call placed to patient and patient made aware.  

## 2018-05-23 ENCOUNTER — Other Ambulatory Visit: Payer: Self-pay | Admitting: Family Medicine

## 2018-05-23 MED ORDER — AMLODIPINE BESYLATE 10 MG PO TABS: 10.0000 mg | ORAL_TABLET | Freq: Every day | ORAL | 3 refills | Status: DC

## 2018-05-23 MED ORDER — METOPROLOL SUCCINATE ER 25 MG PO TB24: 25.0000 mg | ORAL_TABLET | Freq: Every day | ORAL | 3 refills | Status: DC

## 2018-05-23 MED ORDER — LOSARTAN POTASSIUM 100 MG PO TABS: 100.0000 mg | ORAL_TABLET | Freq: Every day | ORAL | 2 refills | Status: DC

## 2018-05-23 MED ORDER — ATORVASTATIN CALCIUM 40 MG PO TABS: 40.0000 mg | ORAL_TABLET | Freq: Every day | ORAL | 1 refills | Status: DC

## 2018-06-15 ENCOUNTER — Ambulatory Visit (INDEPENDENT_AMBULATORY_CARE_PROVIDER_SITE_OTHER): Payer: Medicare Other | Admitting: Family Medicine

## 2018-06-15 ENCOUNTER — Encounter: Payer: Self-pay | Admitting: Family Medicine

## 2018-06-15 VITALS — BP 140/72 | HR 86 | Temp 98.0°F | Resp 17 | Ht 72.0 in | Wt 231.0 lb

## 2018-06-15 DIAGNOSIS — J441 Chronic obstructive pulmonary disease with (acute) exacerbation: Secondary | ICD-10-CM | POA: Diagnosis not present

## 2018-06-15 DIAGNOSIS — J329 Chronic sinusitis, unspecified: Secondary | ICD-10-CM

## 2018-06-15 MED ORDER — MONTELUKAST SODIUM 10 MG PO TABS: 10.0000 mg | ORAL_TABLET | Freq: Every day | ORAL | 3 refills | Status: DC

## 2018-06-15 MED ORDER — PREDNISONE 20 MG PO TABS: ORAL_TABLET | ORAL | 0 refills | Status: DC

## 2018-06-15 MED ORDER — AMOXICILLIN-POT CLAVULANATE 875-125 MG PO TABS: 1.0000 | ORAL_TABLET | Freq: Two times a day (BID) | ORAL | 0 refills | Status: AC

## 2018-06-15 MED ORDER — IPRATROPIUM BROMIDE 0.06 % NA SOLN: 2.0000 | Freq: Four times a day (QID) | NASAL | 12 refills | Status: DC

## 2018-06-15 NOTE — Progress Notes (Signed)
Patient ID: Vincent Short, male    DOB: [birth year], 68 y.o.   MRN: [id number]  PCP: Susy Frizzle, MD  Chief Complaint  Patient presents with  . URI    Patient in today with c/o cough, nasal congestion, and chest congestion. Onset Friday     Subjective:   Vincent Short is a 68 y.o. male, presents to clinic with CC of nasal sx worse with weather changes and his mold allergy and worsening COPD with cough, productive mostly clear, SOB and wheeze are starting to be worse than his baseline.  More concerning to him is his nasal congestion, profuse nasal discharge that has become more purulent, and he has a hx of recurrent sinusitis.  He has been compliant with his Zyrtec, Flonase and Symbicort.  In the past 2 months he has had much worse nasal symptoms and several COPD exacerbations that were difficult to treat, he did require several rounds of antibiotics and several prescriptions of steroids.  His symptoms again became worse about 5 days ago, he wants to prevent it from causing another severe COPD exacerbation.  He has not been using his albuterol inhaler or nebulizers hardly at all.  He is taking Sudafed and this helps some with his congestion.  He is sleeping well at night he is not waking up coughing but certain positions obstruct his nasal passages and he is being careful in how he lays so that he can breathe throughout the night.  He has associated sore throat, postnasal drip, pressure in his sinuses more so his bilateral maxillary sinuses, he denies any associated fever, chills, sweats, chest pain, myalgias, weakness, fatigue, unintentional weight loss, lower extremity edema, palpitations, near-syncope, orthopnea, PND.  Last steroid Rx was 04/07/18 Abx: doxycycline 04/03/18 Zpak 03/30/18  Pt has hx of COPD, lung resection, CAD, HLD, asthma, former smoker, severe chronic and seasonal allergic rhinitis  Patient Active Problem List   Diagnosis Date Noted  . CAD- mild disease at  cath 2004 11/06/2013  . 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 Hyperlipidemia   . Seroma, postoperative 04/06/2011  . COPD (chronic obstructive pulmonary disease) ([place])   . Lung mass   . Atelectasis   . IRRITABLE BOWEL SYNDROME 03/07/2009  . HEMORRHAGE OF RECTUM AND ANUS 03/07/2009  . OBSTRUCTIVE SLEEP APNEA 08/08/2008  . TOBACCO ABUSE 08/06/2008  . HYPERCHOLESTEROLEMIA 07/12/2007  . HYPERTENSION 07/12/2007  . ALLERGIC RHINITIS 07/12/2007  . ASTHMA 07/12/2007  . DUODENITIS, WITHOUT HEMORRHAGE 07/12/2007     Prior to Admission medications   Medication Sig Start Date End Date Taking? Authorizing Provider  albuterol (PROVENTIL HFA;VENTOLIN HFA) 108 (90 Base) MCG/ACT inhaler Inhale 2 puffs into the lungs every 6 (six) hours as needed for wheezing or shortness of breath. 03/30/18  Yes [place], Modena Nunnery, MD  albuterol (PROVENTIL) (2.5 MG/3ML) 0.083% nebulizer solution Take 3 mLs (2.5 mg total) by nebulization every 6 (six) hours as needed for wheezing or shortness of breath. 03/30/18  Yes [place], Modena Nunnery, MD  ALPRAZolam Duanne Moron) 0.5 MG tablet Take 1 tablet (0.5 mg total) by mouth 3 (three) times daily as needed for anxiety. 08/27/16  Yes Susy Frizzle, MD  amLODipine (NORVASC) 10 MG tablet Take 1 tablet (10 mg total) by mouth daily. 05/23/18  Yes Susy Frizzle, MD  atorvastatin (LIPITOR) 40 MG tablet Take 1 tablet (40 mg total) by mouth daily. 05/23/18  Yes Susy Frizzle, MD  budesonide-formoterol [facility]) 160-4.5 MCG/ACT inhaler Inhale 2 puffs into the lungs 2 (  two) times daily.     Yes [provider]  cetirizine (ZYRTEC) 10 MG tablet Take 10 mg by mouth daily.   Yes [provider]  fluticasone (FLONASE) 50 MCG/ACT nasal spray PLACE 2 SPRAYS INTO THE NOSTRIL(S) DAILY 08/11/17  Yes Susy Frizzle, MD  losartan (COZAAR) 100 MG tablet Take 1 tablet (100 mg total) by mouth daily. 05/23/18  Yes Susy Frizzle, MD  metoprolol succinate (TOPROL-XL) 25 MG 24 hr tablet Take 1  tablet (25 mg total) by mouth daily. 05/23/18  Yes Susy Frizzle, MD  Multiple Vitamin (MULTIVITAMIN) tablet Take 1 tablet by mouth daily.   Yes [provider]  niacin (NIASPAN) 1000 MG CR tablet TAKE 1 TABLET (1,000 MG TOTAL) BY MOUTH AT BEDTIME. 08/15/15  Yes [place], Cammie Mcgee, MD  sildenafil (VIAGRA) 100 MG tablet TAKE 1/2 TO 1 TABLETS BY MOUTH DAILY AS NEEDED FOR ERECTILE DYSFUNCTION. 05/17/17  Yes Susy Frizzle, MD     Allergies  Allergen Reactions  . Contrast Media [Iodinated Diagnostic Agents] Swelling    Allergic reaction 1987, ok w/ 13 hr prep//a.c.     Family History  Problem Relation Age of Onset  . Coronary artery disease Father   . Allergic rhinitis Father   . Heart disease Father   . Coronary artery disease Mother   . Allergic rhinitis Mother   . Heart disease Mother   . Kidney disease Mother   . Stroke Mother      Social History   Socioeconomic History  . Marital status: Married    Spouse name: Not on file  . Number of children: Not on file  . Years of education: Not on file  . Highest education level: Not on file  Occupational History  . Occupation: Tourist information centre manager    Employer: [employer]  . Financial resource strain: Not on file  . Food insecurity:    Worry: Not on file    Inability: Not on file  . Transportation needs:    Medical: Not on file    Non-medical: Not on file  Tobacco Use  . Smoking status: Former Smoker    Types: Cigarettes    Last attempt to quit: 12/29/2010    Years since quitting: 7.4  . Smokeless tobacco: Never Used  Substance and Sexual Activity  . Alcohol use: Yes  . Drug use: No  . Sexual activity: Not on file  Lifestyle  . Physical activity:    Days per week: Not on file    Minutes per session: Not on file  . Stress: Not on file  Relationships  . Social connections:    Talks on phone: Not on file    Gets together: Not on file    Attends religious service: Not on file    Active  member of club or organization: Not on file    Attends meetings of clubs or organizations: Not on file    Relationship status: Not on file  . Intimate partner violence:    Fear of current or ex partner: Not on file    Emotionally abused: Not on file    Physically abused: Not on file    Forced sexual activity: Not on file  Other Topics Concern  . Not on file  Social History Narrative    He is married.  He works in Water quality scientist.  He smoked 1 pack of cigarettes a day for the last 35  years.  He has occasional alcohol use.      Review of Systems  Constitutional: Negative.   HENT: Negative.   Eyes: Negative.   Respiratory: Negative.   Cardiovascular: Negative.   Gastrointestinal: Negative.   Endocrine: Negative.   Genitourinary: Negative.   Musculoskeletal: Negative.   Skin: Negative.   Allergic/Immunologic: Negative.   Neurological: Negative.   Hematological: Negative.   Psychiatric/Behavioral: Negative.   All other systems reviewed and are negative.      Objective:    Vitals:   06/15/18 1227  BP: 140/72  Pulse: 86  Resp: 17  Temp: 98 F (36.7 C)  TempSrc: Oral  SpO2: 97%  Weight: 231 lb (104.8 kg)  Height: 6' (1.829 m)      Physical Exam Vitals signs and nursing note reviewed.  Constitutional:      General: He is not in acute distress.    Appearance: Normal appearance. He is well-developed. He is obese. He is not ill-appearing, toxic-appearing or diaphoretic.  HENT:     Head: Normocephalic and atraumatic.     Jaw: No trismus.     Right Ear: Tympanic membrane, ear canal and external ear normal.     Left Ear: Tympanic membrane, ear canal and external ear normal.     Nose: Mucosal edema, congestion and rhinorrhea present. No nasal deformity. Rhinorrhea is purulent.     Right Nostril: No epistaxis.     Left Nostril: No epistaxis.     Right Turbinates: Enlarged and swollen.     Left Turbinates: Enlarged and swollen.     Right Sinus:  No maxillary sinus tenderness or frontal sinus tenderness.     Left Sinus: No maxillary sinus tenderness or frontal sinus tenderness.     Mouth/Throat:     Mouth: Mucous membranes are not pale, not dry and not cyanotic.     Pharynx: Uvula midline. Posterior oropharyngeal erythema present. No oropharyngeal exudate or uvula swelling.     Tonsils: No tonsillar exudate or tonsillar abscesses.  Eyes:     General: Lids are normal.        Right eye: No discharge.        Left eye: No discharge.     Conjunctiva/sclera: Conjunctivae normal.     Pupils: Pupils are equal, round, and reactive to light.  Neck:     Musculoskeletal: Normal range of motion and neck supple.     Trachea: Trachea and phonation normal. No tracheal deviation.  Cardiovascular:     Rate and Rhythm: Normal rate and regular rhythm.     Pulses:          Radial pulses are 2+ on the right side and 2+ on the left side.     Heart sounds: Normal heart sounds. No murmur. No friction rub. No gallop.   Pulmonary:     Effort: Pulmonary effort is normal. No tachypnea, accessory muscle usage or respiratory distress.     Breath sounds: No stridor. Wheezing and rhonchi present. No decreased breath sounds or rales.     Comments: Frequent coughing Abdominal:     General: Bowel sounds are normal. There is no distension.     Palpations: Abdomen is soft.     Tenderness: There is no abdominal tenderness.  Musculoskeletal: Normal range of motion.  Skin:    General: Skin is warm and dry.     Capillary Refill: Capillary refill takes less than 2 seconds.     Coloration: Skin is not pale.  Findings: No rash.     Nails: There is no clubbing.   Neurological:     Mental Status: He is alert and oriented to person, place, and time.     Motor: No abnormal muscle tone.     Coordination: Coordination normal.     Gait: Gait normal.  Psychiatric:        Speech: Speech normal.        Behavior: Behavior normal. Behavior is cooperative.            Assessment & Plan:   68 y/o male, non-toxic appearing, presents with COPD exacerbation - mild (just starting per the pt) and recurrent rhinosinusitis.  He is afebrile, w/o concerning constitutional sx, he has profuse nasal discharge, but no sinus TTP.  Do not believe abx are indicated right now, but may soon be.  Asked for a few days of increased tx for sinuses - he appears to have viral URI + baseline chronic rhinosinusitis.  Add singulair daily and trial atrovent nasal spray.  If not better in a few days would be appropriate to start abx, with duration of his illness, but I do believe his chronic nasal sx are undercontrolled  -  Over the past couple months nasal sx worse many times despite multiple tx with abx.  Add singulair and would consider referral to ENT if sx reoccur.    Will start tx for AECOPD, sx very mild right now.  He is not using albuterol hardly at all, has inhaler and nebs available to him.  Encouraged him to use when having worsening wheeze, SOB, cough, or chest tightness.      ICD-10-CM   1. Recurrent rhinosinusitis J32.9 montelukast (SINGULAIR) 10 MG tablet    amoxicillin-clavulanate (AUGMENTIN) 875-125 MG tablet    ipratropium (ATROVENT) 0.06 % nasal spray   stop sudafed if you start antibiotics  2. Acute exacerbation of chronic obstructive pulmonary disease (COPD) (HCC) J44.1 predniSONE (DELTASONE) 20 MG tablet   steroids, mucinex, cough meds, use albuterol PRN       Delsa Grana, PA-C 06/15/18 12:34 PM

## 2018-06-17 ENCOUNTER — Encounter: Payer: Self-pay | Admitting: Family Medicine

## 2018-09-09 ENCOUNTER — Other Ambulatory Visit: Payer: Self-pay | Admitting: Family Medicine

## 2018-09-09 DIAGNOSIS — J329 Chronic sinusitis, unspecified: Secondary | ICD-10-CM

## 2018-09-26 ENCOUNTER — Other Ambulatory Visit: Payer: Self-pay | Admitting: Family Medicine

## 2018-09-26 DIAGNOSIS — J329 Chronic sinusitis, unspecified: Secondary | ICD-10-CM

## 2018-10-03 ENCOUNTER — Other Ambulatory Visit: Payer: Self-pay | Admitting: Family Medicine

## 2018-10-03 DIAGNOSIS — J329 Chronic sinusitis, unspecified: Secondary | ICD-10-CM

## 2018-10-03 MED ORDER — MONTELUKAST SODIUM 10 MG PO TABS: ORAL_TABLET | ORAL | 3 refills | Status: DC

## 2018-11-14 ENCOUNTER — Other Ambulatory Visit: Payer: Self-pay | Admitting: Family Medicine

## 2018-11-14 MED ORDER — ATORVASTATIN CALCIUM 40 MG PO TABS: 40.0000 mg | ORAL_TABLET | Freq: Every day | ORAL | 1 refills | Status: DC

## 2019-01-04 ENCOUNTER — Telehealth: Payer: Self-pay | Admitting: Family Medicine

## 2019-01-04 ENCOUNTER — Ambulatory Visit (INDEPENDENT_AMBULATORY_CARE_PROVIDER_SITE_OTHER): Payer: Medicare Other | Admitting: Family Medicine

## 2019-01-04 ENCOUNTER — Other Ambulatory Visit: Payer: Self-pay

## 2019-01-04 DIAGNOSIS — Z23 Encounter for immunization: Secondary | ICD-10-CM

## 2019-01-04 NOTE — Telephone Encounter (Signed)
Pt in for flu shot and would like to know if you would call him in an antibx for his tooth? He is changing dentist and the dentist will not refill it for him without an appt. He was last given Amoxicillin 800mg  bid (he has an apt with new dentist on the 24th of this month.

## 2019-01-05 MED ORDER — AMOXICILLIN 875 MG PO TABS: 875.0000 mg | ORAL_TABLET | Freq: Two times a day (BID) | ORAL | 0 refills | Status: DC

## 2019-01-05 NOTE — Telephone Encounter (Signed)
Ok with amoxicillin 875 mg pobid for 10 days.

## 2019-01-05 NOTE — Telephone Encounter (Signed)
Medication called/sent to requested pharmacy and pt aware 

## 2019-02-10 ENCOUNTER — Other Ambulatory Visit: Payer: Self-pay | Admitting: Family Medicine

## 2019-05-25 ENCOUNTER — Other Ambulatory Visit: Payer: Self-pay

## 2019-05-25 ENCOUNTER — Ambulatory Visit (INDEPENDENT_AMBULATORY_CARE_PROVIDER_SITE_OTHER): Payer: Medicare Other | Admitting: Family Medicine

## 2019-05-25 DIAGNOSIS — J441 Chronic obstructive pulmonary disease with (acute) exacerbation: Secondary | ICD-10-CM | POA: Diagnosis not present

## 2019-05-25 MED ORDER — LEVOFLOXACIN 500 MG PO TABS: 500.0000 mg | ORAL_TABLET | Freq: Every day | ORAL | 0 refills | Status: DC

## 2019-05-25 MED ORDER — PREDNISONE 20 MG PO TABS: ORAL_TABLET | ORAL | 0 refills | Status: DC

## 2019-05-25 NOTE — Progress Notes (Signed)
Subjective:    Patient ID: Vincent Short, male    DOB: [birth year], 69 y.o.   MRN: [id number]  HPI  Patient is being seen today as a telephone visit.  He consents to be seen via telephone.  Phone call began at 251.  Phone call concluded at 305.  Patient has a history of chronic bronchitis/COPD.  He usually has an episode of COPD exacerbation 1-2 times a year the require steroids and antibiotics.  He states that over the last 24 hours he has developed severe fatigue, dyspnea on exertion, chest congestion, wheezing, cough as well as a fever.  He also reports head congestion.  He denies any loss in his sense of smell or taste.  He denies any rhinorrhea.  He does report shortness of breath and wheezing.  He denies any known exposure to Covid.  He states that his symptoms feel similar to his previous COPD exacerbations however his fatigue and myalgias are more serious than he has had before. Past Medical History:  Diagnosis Date  . Allergic rhinitis   . Asthma   . Atelectasis    Rt upper lobe  . COPD (chronic obstructive pulmonary disease) ([place])   . GERD (gastroesophageal reflux disease)   . History of colon polyps 03/14/2009   Tubular adenomatous and Hyperplastic polyps  . HTN (hypertension)   . Hyperlipidemia   . Lung mass    noted on CT scan(12/24/10)   . S/P cardiac cath    negative  . Tobacco abuse   . Treadmill stress test negative for angina pectoris    Dr. Claiborne Billings   Past Surgical History:  Procedure Laterality Date  . Arthroscopy left wrist with radial styloidectomy.  07/06/2000   Dr Fredna Dow  . Bronchoscopy with airway inspection, brush cytology,  11/22/2006   Dr Halford Chessman  . CARDIAC CATHETERIZATION  07/05/2002   Dr Horton Marshall LVF, SMOOTH 10% TO 20% OSTIAL AND 10%-20% LUMINAL IRREGULARITY OF THE PROXIMAL LAD, SMOOTH 30%-40% PROXIMAL RCA,WHICH IMPROVED FOLLOWING INTRACORONARY NTG ADMINISTRATION SUGGESTING CORONARY VASOSPASM. NO AORTOILIAC DISEASE.  Marland Kitchen DOPPLER ECHOCARDIOGRAPHY N/A  06/22/08   LV SIZE IS NORMAL. LV FUNCTION IS NORMAL. EF=>55%. THERE IS BORDERLINE CONCENTRIC LVH.  Marland Kitchen FRACTURE SURGERY     Left wrist scaphoid fx  . FRACTURE SURGERY     Left ankle surgery  . Left ankle surgery  11/11/2005   Dr Noemi Chapel  . LUNG SURGERY  01/21/11   Dr. Cyndia Bent   FOB, R THORACOTOMY WITH RIGHT UPPER LOBECTOMY  . LUNG SURGERY  01/21/11   FOB, R THORACOTOMY WITH RIGHT UPPER LOBECTOMY  . NASAL SINUS SURGERY  1990  . NUCLEAR STRESS TEST N/A 09/09/10   NORMAL PATTERN OF PERFUSION IN ALL REGIONS. EF IS 63%. NO WALL MOTION ABNORMALITIES.  Marland Kitchen ROTATOR CUFF REPAIR  pending   Dr. Noemi Chapel  . TONSILLECTOMY     Current Outpatient Medications on File Prior to Visit  Medication Sig Dispense Refill  . albuterol (PROVENTIL HFA;VENTOLIN HFA) 108 (90 Base) MCG/ACT inhaler Inhale 2 puffs into the lungs every 6 (six) hours as needed for wheezing or shortness of breath. 1 Inhaler 5  . albuterol (PROVENTIL) (2.5 MG/3ML) 0.083% nebulizer solution Take 3 mLs (2.5 mg total) by nebulization every 6 (six) hours as needed for wheezing or shortness of breath. 150 mL 1  . ALPRAZolam (XANAX) 0.5 MG tablet Take 1 tablet (0.5 mg total) by mouth 3 (three) times daily as needed for anxiety. 60 tablet 0  . amLODipine (NORVASC) 10 MG tablet  Take 1 tablet (10 mg total) by mouth daily. 90 tablet 3  . amoxicillin (AMOXIL) 875 MG tablet Take 1 tablet (875 mg total) by mouth 2 (two) times daily. 20 tablet 0  . atorvastatin (LIPITOR) 40 MG tablet Take 1 tablet (40 mg total) by mouth daily. 90 tablet 1  . budesonide-formoterol (SYMBICORT) 160-4.5 MCG/ACT inhaler Inhale 2 puffs into the lungs 2 (two) times daily.      . cetirizine (ZYRTEC) 10 MG tablet Take 10 mg by mouth daily.    . fluticasone (FLONASE) 50 MCG/ACT nasal spray PLACE 2 SPRAYS INTO THE NOSTRIL(S) DAILY 48 g 3  . ipratropium (ATROVENT) 0.06 % nasal spray Place 2 sprays into both nostrils 4 (four) times daily. 15 mL 12  . losartan (COZAAR) 100 MG tablet TAKE 1  TABLET BY MOUTH EVERY DAY 90 tablet 2  . metoprolol succinate (TOPROL-XL) 25 MG 24 hr tablet Take 1 tablet (25 mg total) by mouth daily. 90 tablet 3  . montelukast (SINGULAIR) 10 MG tablet TAKE 1 TABLET BY MOUTH EVERYDAY AT BEDTIME 90 tablet 3  . Multiple Vitamin (MULTIVITAMIN) tablet Take 1 tablet by mouth daily.    . niacin (NIASPAN) 1000 MG CR tablet TAKE 1 TABLET (1,000 MG TOTAL) BY MOUTH AT BEDTIME. 30 tablet 5  . predniSONE (DELTASONE) 20 MG tablet 3 tabs poqday 1-3, 2 tabs poqday 4-6, 1 tab poqday 7-9 18 tablet 0  . sildenafil (VIAGRA) 100 MG tablet TAKE 1/2 TO 1 TABLETS BY MOUTH DAILY AS NEEDED FOR ERECTILE DYSFUNCTION. 5 tablet 0   No current facility-administered medications on file prior to visit.   Allergies  Allergen Reactions  . Contrast Media [Iodinated Diagnostic Agents] Swelling    Allergic reaction 1987, ok w/ 13 hr prep//a.c.   Social History   Socioeconomic History  . Marital status: Married    Spouse name: Not on file  . Number of children: Not on file  . Years of education: Not on file  . Highest education level: Not on file  Occupational History  . Occupation: Tourist information centre manager    Employer: [employer]  Tobacco Use  . Smoking status: Former Smoker    Types: Cigarettes    Quit date: 12/29/2010    Years since quitting: 8.4  . Smokeless tobacco: Never Used  Substance and Sexual Activity  . Alcohol use: Yes  . Drug use: No  . Sexual activity: Not on file  Other Topics Concern  . Not on file  Social History Narrative    He is married.  He works in Water quality scientist.  He smoked 1 pack of cigarettes a day for the last 35     years.  He has occasional alcohol use.    Social Determinants of Health   Financial Resource Strain:   . Difficulty of Paying Living Expenses: Not on file  Food Insecurity:   . Worried About Charity fundraiser in the Last Year: Not on file  . Ran Out of Food in the Last Year: Not on file  Transportation Needs:   .  Lack of Transportation (Medical): Not on file  . Lack of Transportation (Non-Medical): Not on file  Physical Activity:   . Days of Exercise per Week: Not on file  . Minutes of Exercise per Session: Not on file  Stress:   . Feeling of Stress : Not on file  Social Connections:   . Frequency of Communication with Friends and Family: Not  on file  . Frequency of Social Gatherings with Friends and Family: Not on file  . Attends Religious Services: Not on file  . Active Member of Clubs or Organizations: Not on file  . Attends Archivist Meetings: Not on file  . Marital Status: Not on file  Intimate Partner Violence:   . Fear of Current or Ex-Partner: Not on file  . Emotionally Abused: Not on file  . Physically Abused: Not on file  . Sexually Abused: Not on file     Review of Systems  All other systems reviewed and are negative.      Objective:   Physical Exam        Assessment & Plan:  COPD exacerbation ([place]) - Plan: SARS-COV-2 RNA,(COVID-19) QUAL NAAT  Physical exam cannot be performed today during a telephone visit however the patient is speaking full and complete sentences without any respiratory distress.  He is wheezing occasionally.  I do believe the patient is having a COPD exacerbation.  However, it is also possible COVID-19.  Therefore I recommended that he dropped off his swab today.  I recommended that he quarantine for 14 days or at least until his test returns on Monday.  Meanwhile I will treat the patient empirically for COPD exacerbation with Levaquin 500 mg daily for 7 days coupled with a prednisone taper pack.  Patient is comfortable with this plan.  He can also use albuterol 2 puffs every 6 hours until wheezing improves

## 2019-05-26 ENCOUNTER — Other Ambulatory Visit: Payer: Medicare Other

## 2019-05-26 DIAGNOSIS — J441 Chronic obstructive pulmonary disease with (acute) exacerbation: Secondary | ICD-10-CM | POA: Diagnosis not present

## 2019-05-27 LAB — SARS-COV-2 RNA,(COVID-19) QUALITATIVE NAAT: SARS CoV2 RNA: NOT DETECTED

## 2019-06-03 ENCOUNTER — Other Ambulatory Visit: Payer: Self-pay | Admitting: Family Medicine

## 2019-07-20 ENCOUNTER — Other Ambulatory Visit: Payer: Self-pay

## 2019-07-20 ENCOUNTER — Ambulatory Visit (INDEPENDENT_AMBULATORY_CARE_PROVIDER_SITE_OTHER): Payer: Medicare Other | Admitting: Family Medicine

## 2019-07-20 ENCOUNTER — Encounter: Payer: Self-pay | Admitting: Family Medicine

## 2019-07-20 VITALS — BP 130/72 | HR 90 | Temp 98.1°F | Resp 20 | Ht 72.0 in | Wt 235.0 lb

## 2019-07-20 DIAGNOSIS — R1030 Lower abdominal pain, unspecified: Secondary | ICD-10-CM

## 2019-07-20 DIAGNOSIS — D171 Benign lipomatous neoplasm of skin and subcutaneous tissue of trunk: Secondary | ICD-10-CM

## 2019-07-20 MED ORDER — DICYCLOMINE HCL 20 MG PO TABS: 20.0000 mg | ORAL_TABLET | Freq: Four times a day (QID) | ORAL | 0 refills | Status: DC | PRN

## 2019-07-20 NOTE — Progress Notes (Addendum)
Subjective:    Patient ID: Vincent Short, male    DOB: 08/01/1950, 69 y.o.   MRN: [id number]  HPI  Patient states that in early 1990 or 72, he was in the hospital with lower abdominal pain.  Patient states that for her than 3 months he was unable to tolerate any food.  As soon as he would eat he would develop epigastric pain and cramping pain.  He can only eat or drink Ensure.  He would also have lower abdominal muscle spasms and intestinal spasms every morning.  He had 2 different colonoscopies that revealed no abnormalities per his report.  He states that he had CT scans and an extensive work-up but never revealed any abnormalities.  Symptoms gradually improved however ever since for the last 30 years, he has had occasional crampy lower abdominal pain.  He states that he will occasionally wake up and go to the bathroom and after experiencing a bowel movement he will have lower abdominal cramps that will last 4 to 5 hours and be debilitating to the point where he is unable to leave the home.  He states that he has had colonoscopies to rule out inflammatory bowel disease.  He states that his father and his brother both have irritable bowel and they jokingly collect the "[place] curse".  He denies any melena or hematochezia.  He denies any heartburn.  He denies any fevers or chills or weight loss.  He is concerned that he may have celiac disease although he has not pinpointed any food that seems to trigger it.  He states that he can sometimes go weeks without having any problems.  He also has a mass on his right mid upper back near the site of his previous lung biopsy.  He states that the mass has been present ever since his surgery.  Initially he was told that it would eventually go away in 1 year after the surgery however it is persistent.  It is roughly the diameter of my fist.  It is a soft well-circumscribed spongy mass.  It is not erythematous or fluctuant.  It does not appear to be a cyst.  It has  the consistency of a lipoma although it appears to have developed after surgery similar to a seroma.  It has been present now for several years. Past Medical History:  Diagnosis Date  . Allergic rhinitis   . Asthma   . Atelectasis    Rt upper lobe  . COPD (chronic obstructive pulmonary disease) ([place])   . GERD (gastroesophageal reflux disease)   . History of colon polyps 03/14/2009   Tubular adenomatous and Hyperplastic polyps  . HTN (hypertension)   . Hyperlipidemia   . Lung mass    noted on CT scan(12/24/10)   . S/P cardiac cath    negative  . Tobacco abuse   . Treadmill stress test negative for angina pectoris    Dr. Claiborne Billings   Past Surgical History:  Procedure Laterality Date  . Arthroscopy left wrist with radial styloidectomy.  07/06/2000   Dr Fredna Dow  . Bronchoscopy with airway inspection, brush cytology,  11/22/2006   Dr Halford Chessman  . CARDIAC CATHETERIZATION  07/05/2002   Dr Horton Marshall LVF, SMOOTH 10% TO 20% OSTIAL AND 10%-20% LUMINAL IRREGULARITY OF THE PROXIMAL LAD, SMOOTH 30%-40% PROXIMAL RCA,WHICH IMPROVED FOLLOWING INTRACORONARY NTG ADMINISTRATION SUGGESTING CORONARY VASOSPASM. NO AORTOILIAC DISEASE.  Marland Kitchen DOPPLER ECHOCARDIOGRAPHY N/A 06/22/08   LV SIZE IS NORMAL. LV FUNCTION IS NORMAL. EF=>55%. THERE IS BORDERLINE CONCENTRIC  LVH.  Marland Kitchen FRACTURE SURGERY     Left wrist scaphoid fx  . FRACTURE SURGERY     Left ankle surgery  . Left ankle surgery  11/11/2005   Dr Noemi Chapel  . LUNG SURGERY  01/21/11   Dr. Cyndia Bent   FOB, R THORACOTOMY WITH RIGHT UPPER LOBECTOMY  . LUNG SURGERY  01/21/11   FOB, R THORACOTOMY WITH RIGHT UPPER LOBECTOMY  . NASAL SINUS SURGERY  1990  . NUCLEAR STRESS TEST N/A 09/09/10   NORMAL PATTERN OF PERFUSION IN ALL REGIONS. EF IS 63%. NO WALL MOTION ABNORMALITIES.  Marland Kitchen ROTATOR CUFF REPAIR  pending   Dr. Noemi Chapel  . TONSILLECTOMY     Current Outpatient Medications on File Prior to Visit  Medication Sig Dispense Refill  . albuterol (PROVENTIL HFA;VENTOLIN HFA) 108 (90 Base)  MCG/ACT inhaler Inhale 2 puffs into the lungs every 6 (six) hours as needed for wheezing or shortness of breath. 1 Inhaler 5  . albuterol (PROVENTIL) (2.5 MG/3ML) 0.083% nebulizer solution Take 3 mLs (2.5 mg total) by nebulization every 6 (six) hours as needed for wheezing or shortness of breath. 150 mL 1  . ALPRAZolam (XANAX) 0.5 MG tablet Take 1 tablet (0.5 mg total) by mouth 3 (three) times daily as needed for anxiety. 60 tablet 0  . amLODipine (NORVASC) 10 MG tablet Take 1 tablet (10 mg total) by mouth daily. 90 tablet 3  . atorvastatin (LIPITOR) 40 MG tablet TAKE 1 TABLET BY MOUTH EVERY DAY 90 tablet 1  . budesonide-formoterol (SYMBICORT) 160-4.5 MCG/ACT inhaler Inhale 2 puffs into the lungs 2 (two) times daily.      . cetirizine (ZYRTEC) 10 MG tablet Take 10 mg by mouth daily.    . fluticasone (FLONASE) 50 MCG/ACT nasal spray PLACE 2 SPRAYS INTO THE NOSTRIL(S) DAILY 48 g 3  . ipratropium (ATROVENT) 0.06 % nasal spray Place 2 sprays into both nostrils 4 (four) times daily. 15 mL 12  . losartan (COZAAR) 100 MG tablet TAKE 1 TABLET BY MOUTH EVERY DAY 90 tablet 2  . metoprolol succinate (TOPROL-XL) 25 MG 24 hr tablet Take 1 tablet (25 mg total) by mouth daily. 90 tablet 3  . montelukast (SINGULAIR) 10 MG tablet TAKE 1 TABLET BY MOUTH EVERYDAY AT BEDTIME 90 tablet 3  . Multiple Vitamin (MULTIVITAMIN) tablet Take 1 tablet by mouth daily.    . niacin (NIASPAN) 1000 MG CR tablet TAKE 1 TABLET (1,000 MG TOTAL) BY MOUTH AT BEDTIME. 30 tablet 5  . sildenafil (VIAGRA) 100 MG tablet TAKE 1/2 TO 1 TABLETS BY MOUTH DAILY AS NEEDED FOR ERECTILE DYSFUNCTION. 5 tablet 0   No current facility-administered medications on file prior to visit.   Allergies  Allergen Reactions  . Contrast Media [Iodinated Diagnostic Agents] Swelling    Allergic reaction 1987, ok w/ 13 hr prep//a.c.   Social History   Socioeconomic History  . Marital status: Married    Spouse name: Not on file  . Number of children: Not on  file  . Years of education: Not on file  . Highest education level: Not on file  Occupational History  . Occupation: Tourist information centre manager    Employer: [employer]  Tobacco Use  . Smoking status: Former Smoker    Types: Cigarettes    Quit date: 12/29/2010    Years since quitting: 8.5  . Smokeless tobacco: Never Used  Substance and Sexual Activity  . Alcohol use: Yes  . Drug use: No  . Sexual activity: Not on file  Other Topics Concern  . Not on file  Social History Narrative    He is married.  He works in Water quality scientist.  He smoked 1 pack of cigarettes a day for the last 35     years.  He has occasional alcohol use.    Social Determinants of Health   Financial Resource Strain:   . Difficulty of Paying Living Expenses:   Food Insecurity:   . Worried About Charity fundraiser in the Last Year:   . Arboriculturist in the Last Year:   Transportation Needs:   . Film/video editor (Medical):   Marland Kitchen Lack of Transportation (Non-Medical):   Physical Activity:   . Days of Exercise per Week:   . Minutes of Exercise per Session:   Stress:   . Feeling of Stress :   Social Connections:   . Frequency of Communication with Friends and Family:   . Frequency of Social Gatherings with Friends and Family:   . Attends Religious Services:   . Active Member of Clubs or Organizations:   . Attends Archivist Meetings:   Marland Kitchen Marital Status:   Intimate Partner Violence:   . Fear of Current or Ex-Partner:   . Emotionally Abused:   Marland Kitchen Physically Abused:   . Sexually Abused:      Review of Systems  All other systems reviewed and are negative.      Objective:   Physical Exam Vitals reviewed.  Neck:     Thyroid: No thyromegaly.     Vascular: No JVD.  Cardiovascular:     Rate and Rhythm: Normal rate.     Heart sounds: Normal heart sounds. No murmur.  Pulmonary:     Effort: Pulmonary effort is normal. No respiratory distress.     Breath sounds: Normal breath  sounds. No wheezing or rales.    Abdominal:     General: Bowel sounds are normal. There is no distension.     Palpations: Abdomen is soft. There is no mass.     Tenderness: There is no abdominal tenderness. There is no rebound.     Hernia: No hernia is present.  Musculoskeletal:     Cervical back: Neck supple.           Assessment & Plan:  Lower abdominal pain - Plan: Celiac Disease Panel  Lipoma of torso - Plan: Ambulatory referral to General Surgery I will gladly check the patient for celiac disease to rule that out however I suspect inflammatory bowel disease.  Recommended trying Bentyl 20 mg tablets at the first sign of intestinal spasms.  He can take that every 6 hours as needed.  If this alleviates the pain I will treat him for irritable bowel given the duration of symptoms I believe that the most likely explanation as the symptoms have been present more days than not for the last 30 years with no life-threatening consequences.  The mass on his back appears to be a lipoma or it could be a congealed seroma.  Consult general surgery for excision   Previous office visit included a discussion regarding elevated blood pressure and a syncopal episode.  This was erroneously dictated into the wrong chart.  The patient did not have an elevated blood pressure.  There was no discussion of syncope.  This was an error on the dictation.  Please ignore the previous office note.  I have removed the erroneous portion in this addendum.

## 2019-07-21 LAB — CELIAC DISEASE PANEL
(tTG) Ab, IgA: 1 U/mL
(tTG) Ab, IgG: 3 U/mL
Gliadin IgA: 2 Units
Gliadin IgG: 1 Units
Immunoglobulin A: 90 mg/dL (ref 70–320)

## 2019-07-22 ENCOUNTER — Other Ambulatory Visit: Payer: Self-pay | Admitting: Family Medicine

## 2019-08-07 ENCOUNTER — Other Ambulatory Visit: Payer: Self-pay | Admitting: Family Medicine

## 2019-09-01 ENCOUNTER — Other Ambulatory Visit: Payer: Self-pay | Admitting: General Surgery

## 2019-10-27 ENCOUNTER — Other Ambulatory Visit: Payer: Self-pay | Admitting: Family Medicine

## 2019-11-21 ENCOUNTER — Other Ambulatory Visit: Payer: Self-pay | Admitting: Family Medicine

## 2019-12-19 ENCOUNTER — Other Ambulatory Visit: Payer: Self-pay | Admitting: Family Medicine

## 2019-12-19 DIAGNOSIS — J329 Chronic sinusitis, unspecified: Secondary | ICD-10-CM

## 2019-12-28 ENCOUNTER — Other Ambulatory Visit: Payer: Self-pay | Admitting: Family Medicine

## 2020-01-25 ENCOUNTER — Other Ambulatory Visit: Payer: Self-pay

## 2020-01-25 ENCOUNTER — Ambulatory Visit (INDEPENDENT_AMBULATORY_CARE_PROVIDER_SITE_OTHER): Payer: Medicare Other | Admitting: Family Medicine

## 2020-01-25 VITALS — BP 130/82 | HR 80 | Temp 97.9°F | Ht 72.0 in | Wt 229.0 lb

## 2020-01-25 DIAGNOSIS — Z125 Encounter for screening for malignant neoplasm of prostate: Secondary | ICD-10-CM

## 2020-01-25 DIAGNOSIS — J438 Other emphysema: Secondary | ICD-10-CM

## 2020-01-25 DIAGNOSIS — Z0000 Encounter for general adult medical examination without abnormal findings: Secondary | ICD-10-CM

## 2020-01-25 DIAGNOSIS — I2510 Atherosclerotic heart disease of native coronary artery without angina pectoris: Secondary | ICD-10-CM

## 2020-01-25 DIAGNOSIS — Z1211 Encounter for screening for malignant neoplasm of colon: Secondary | ICD-10-CM

## 2020-01-25 DIAGNOSIS — Z122 Encounter for screening for malignant neoplasm of respiratory organs: Secondary | ICD-10-CM

## 2020-01-25 DIAGNOSIS — Z0001 Encounter for general adult medical examination with abnormal findings: Secondary | ICD-10-CM | POA: Diagnosis not present

## 2020-01-25 DIAGNOSIS — I10 Essential (primary) hypertension: Secondary | ICD-10-CM

## 2020-01-25 DIAGNOSIS — R739 Hyperglycemia, unspecified: Secondary | ICD-10-CM

## 2020-01-25 DIAGNOSIS — E7800 Pure hypercholesterolemia, unspecified: Secondary | ICD-10-CM

## 2020-01-25 DIAGNOSIS — Z23 Encounter for immunization: Secondary | ICD-10-CM

## 2020-01-25 DIAGNOSIS — F17200 Nicotine dependence, unspecified, uncomplicated: Secondary | ICD-10-CM

## 2020-01-25 MED ORDER — SILDENAFIL CITRATE 100 MG PO TABS
ORAL_TABLET | ORAL | 11 refills | Status: DC
Start: 2020-01-25 — End: 2021-02-10

## 2020-01-25 NOTE — Progress Notes (Addendum)
Subjective:    Patient ID: Vincent Short, male    DOB: 05-Jun-1950, 69 y.o.   MRN: [id number]  HPI Patient is here today for a complete physical exam.  Unfortunately he has resumed smoking.  He states that he is not smoking constantly only when he is under stress.  He also reports a daily cough productive of thick clear mucus.  He reports chest congestion.  He has underlying history of COPD but has not had a recent exacerbation.  He has had both of his [place] Covid vaccines.  He is due for the booster.  He is due for a booster on Pneumovax 23.  He is also due for seasonal flu shot.  His last colonoscopy was in 2010 and was significant for polyps.  He is overdue for this.  He is also due for lung cancer screening.  He has a history of a mass removed from his right lung thankfully that turned into be benign tissue and not cancer.  However he has not had a CAT scan or any type of lung cancer screening despite having a greater than 30-pack-year history of smoking.  He is also due for prostate cancer screening.  He denies any falls or depression or memory loss.  His blood pressure today is excellent. Immunization History  Administered Date(s) Administered  . Fluad Quad(high Dose 65+) 01/04/2019, 01/25/2020  . Influenza, High Dose Seasonal PF 02/25/2017, 03/04/2018  . Influenza,inj,Quad PF,6+ Mos 03/21/2013, 04/17/2014  . Influenza-Unspecified 02/15/2003, 03/01/2004  . PFIZER SARS-COV-2 Vaccination 05/22/2019, 06/22/2019  . Pneumococcal Conjugate-13 04/17/2014  . Pneumococcal Polysaccharide-23 03/21/2013, 01/25/2020  . Td 06/27/2002    Past Medical History:  Diagnosis Date  . Allergic rhinitis   . Asthma   . Atelectasis    Rt upper lobe  . COPD (chronic obstructive pulmonary disease) ([place])   . GERD (gastroesophageal reflux disease)   . History of colon polyps 03/14/2009   Tubular adenomatous and Hyperplastic polyps  . HTN (hypertension)   . Hyperlipidemia   . Lung mass    noted on CT  scan(12/24/10)   . S/P cardiac cath    negative  . Tobacco abuse   . Treadmill stress test negative for angina pectoris    Dr. Claiborne Billings   Past Surgical History:  Procedure Laterality Date  . Arthroscopy left wrist with radial styloidectomy.  07/06/2000   Dr Fredna Dow  . Bronchoscopy with airway inspection, brush cytology,  11/22/2006   Dr Halford Chessman  . CARDIAC CATHETERIZATION  07/05/2002   Dr Horton Marshall LVF, SMOOTH 10% TO 20% OSTIAL AND 10%-20% LUMINAL IRREGULARITY OF THE PROXIMAL LAD, SMOOTH 30%-40% PROXIMAL RCA,WHICH IMPROVED FOLLOWING INTRACORONARY NTG ADMINISTRATION SUGGESTING CORONARY VASOSPASM. NO AORTOILIAC DISEASE.  Marland Kitchen DOPPLER ECHOCARDIOGRAPHY N/A 06/22/08   LV SIZE IS NORMAL. LV FUNCTION IS NORMAL. EF=>55%. THERE IS BORDERLINE CONCENTRIC LVH.  Marland Kitchen FRACTURE SURGERY     Left wrist scaphoid fx  . FRACTURE SURGERY     Left ankle surgery  . Left ankle surgery  11/11/2005   Dr Noemi Chapel  . LUNG SURGERY  01/21/11   Dr. Cyndia Bent   FOB, R THORACOTOMY WITH RIGHT UPPER LOBECTOMY  . LUNG SURGERY  01/21/11   FOB, R THORACOTOMY WITH RIGHT UPPER LOBECTOMY  . NASAL SINUS SURGERY  1990  . NUCLEAR STRESS TEST N/A 09/09/10   NORMAL PATTERN OF PERFUSION IN ALL REGIONS. EF IS 63%. NO WALL MOTION ABNORMALITIES.  Marland Kitchen ROTATOR CUFF REPAIR  pending   Dr. Noemi Chapel  . TONSILLECTOMY  Current Outpatient Medications on File Prior to Visit  Medication Sig Dispense Refill  . albuterol (PROVENTIL HFA;VENTOLIN HFA) 108 (90 Base) MCG/ACT inhaler Inhale 2 puffs into the lungs every 6 (six) hours as needed for wheezing or shortness of breath. 1 Inhaler 5  . albuterol (PROVENTIL) (2.5 MG/3ML) 0.083% nebulizer solution Take 3 mLs (2.5 mg total) by nebulization every 6 (six) hours as needed for wheezing or shortness of breath. 150 mL 1  . ALPRAZolam (XANAX) 0.5 MG tablet Take 1 tablet (0.5 mg total) by mouth 3 (three) times daily as needed for anxiety. 60 tablet 0  . amLODipine (NORVASC) 10 MG tablet TAKE 1 TABLET BY MOUTH EVERY DAY 90  tablet 3  . atorvastatin (LIPITOR) 40 MG tablet TAKE 1 TABLET BY MOUTH EVERY DAY 90 tablet 1  . budesonide-formoterol (SYMBICORT) 160-4.5 MCG/ACT inhaler Inhale 2 puffs into the lungs 2 (two) times daily.      . cetirizine (ZYRTEC) 10 MG tablet Take 10 mg by mouth daily.    Marland Kitchen dicyclomine (BENTYL) 20 MG tablet Take 1 tablet (20 mg total) by mouth 4 (four) times daily as needed for spasms. 30 tablet 0  . fluticasone (FLONASE) 50 MCG/ACT nasal spray PLACE 2 SPRAYS INTO THE NOSTRIL(S) DAILY 48 g 3  . ipratropium (ATROVENT) 0.06 % nasal spray Place 2 sprays into both nostrils 4 (four) times daily. 15 mL 12  . losartan (COZAAR) 100 MG tablet TAKE 1 TABLET BY MOUTH EVERY DAY 30 tablet 3  . metoprolol succinate (TOPROL-XL) 25 MG 24 hr tablet TAKE 1 TABLET BY MOUTH EVERY DAY 90 tablet 3  . montelukast (SINGULAIR) 10 MG tablet TAKE 1 TABLET BY MOUTH EVERYDAY AT BEDTIME 90 tablet 3  . Multiple Vitamin (MULTIVITAMIN) tablet Take 1 tablet by mouth daily.    . niacin (NIASPAN) 1000 MG CR tablet TAKE 1 TABLET (1,000 MG TOTAL) BY MOUTH AT BEDTIME. 30 tablet 5   No current facility-administered medications on file prior to visit.   Allergies  Allergen Reactions  . Contrast Media [Iodinated Diagnostic Agents] Swelling    Allergic reaction 1987, ok w/ 13 hr prep//a.c.   Social History   Socioeconomic History  . Marital status: Married    Spouse name: Not on file  . Number of children: Not on file  . Years of education: Not on file  . Highest education level: Not on file  Occupational History  . Occupation: Tourist information centre manager    Employer: [employer]  Tobacco Use  . Smoking status: Former Smoker    Types: Cigarettes    Quit date: 12/29/2010    Years since quitting: 9.0  . Smokeless tobacco: Never Used  Substance and Sexual Activity  . Alcohol use: Yes  . Drug use: No  . Sexual activity: Not on file  Other Topics Concern  . Not on file  Social History Narrative    He is married.  He works in  Water quality scientist.  He smoked 1 pack of cigarettes a day for the last 35     years.  He has occasional alcohol use.    Social Determinants of Health   Financial Resource Strain:   . Difficulty of Paying Living Expenses: Not on file  Food Insecurity:   . Worried About Charity fundraiser in the Last Year: Not on file  . Ran Out of Food in the Last Year: Not on file  Transportation Needs:   . Lack of Transportation (  Medical): Not on file  . Lack of Transportation (Non-Medical): Not on file  Physical Activity:   . Days of Exercise per Week: Not on file  . Minutes of Exercise per Session: Not on file  Stress:   . Feeling of Stress : Not on file  Social Connections:   . Frequency of Communication with Friends and Family: Not on file  . Frequency of Social Gatherings with Friends and Family: Not on file  . Attends Religious Services: Not on file  . Active Member of Clubs or Organizations: Not on file  . Attends Archivist Meetings: Not on file  . Marital Status: Not on file  Intimate Partner Violence:   . Fear of Current or Ex-Partner: Not on file  . Emotionally Abused: Not on file  . Physically Abused: Not on file  . Sexually Abused: Not on file     Review of Systems  All other systems reviewed and are negative.      Objective:   Physical Exam Vitals reviewed.  Neck:     Thyroid: No thyromegaly.     Vascular: No JVD.  Cardiovascular:     Rate and Rhythm: Normal rate.     Heart sounds: Normal heart sounds. No murmur heard.   Pulmonary:     Effort: Pulmonary effort is normal. No respiratory distress.     Breath sounds: Normal breath sounds. No wheezing or rales.  Abdominal:     General: Bowel sounds are normal. There is no distension.     Palpations: Abdomen is soft.     Tenderness: There is no abdominal tenderness. There is no rebound.  Musculoskeletal:     Cervical back: Neck supple.           Assessment & Plan:  Encounter for  screening for lung cancer - Plan: CT CHEST LUNG CA SCREEN LOW DOSE W/O CM  Colon cancer screening - Plan: Ambulatory referral to Gastroenterology  General medical exam - Plan: CBC with Differential/Platelet, COMPLETE METABOLIC PANEL WITH GFR, Lipid panel  Prostate cancer screening - Plan: PSA  Need for immunization against influenza - Plan: Flu Vaccine QUAD High Dose(Fluad)  Immunization due - Plan: Pneumococcal polysaccharide vaccine 23-valent greater than or equal to 2yo subcutaneous/IM  TOBACCO ABUSE  Other emphysema (HCC)  Benign essential HTN  Pure hypercholesterolemia  CAD in native artery   Schedule the patient for a CT scan to screen for lung cancer.  Schedule the patient to see GI for a colonoscopy.  Screen for prostate cancer with a PSA.  Patient received his flu shot today.  He received a booster on Pneumovax 23.  I recommended a booster on his [place] vaccination at his earliest convenience.  Check a CBC, CMP, fasting lipid panel.  Given his history of coronary artery disease that is being managed medically, his goal LDL cholesterol is less than 70 and his goal blood pressure should be less than 140/90.  Blood pressure today is well controlled.  Monitor his blood sugar.  Discontinue Symbicort.  Replace with Trelegy 1 inhalation a day to see if this will help with cough and chest congestion and productive mucus.  Can also use Mucinex.  Strongly counseled the patient to quit smoking.  Also recommended diet exercise and weight loss.    Patient has a history of obstructive sleep apnea.  This was diagnosed several years ago by another physician.  He has been consistently wearing his CPAP every night since that time.  However his equipment  is starting to deteriorate and he would like new equipment.  Again he wears his mask at least 6 to 7 hours every night for as long as he is sleeping.  He is compliant using the equipment.  I will send a consultation to a local durable medical  equipment provider in order to try to have new equipment provided to the patient.

## 2020-01-26 NOTE — Addendum Note (Signed)
Addended by: Saundra Shelling on: 01/26/2020 10:58 AM   Modules accepted: Orders

## 2020-01-29 LAB — CBC WITH DIFFERENTIAL/PLATELET
Absolute Monocytes: 978 cells/uL — ABNORMAL HIGH (ref 200–950)
Basophils Absolute: 62 cells/uL (ref 0–200)
Basophils Relative: 0.6 %
Eosinophils Absolute: 343 cells/uL (ref 15–500)
Eosinophils Relative: 3.3 %
HCT: 47.3 % (ref 38.5–50.0)
Hemoglobin: 16.3 g/dL (ref 13.2–17.1)
Lymphs Abs: 2746 cells/uL (ref 850–3900)
MCH: 31.5 pg (ref 27.0–33.0)
MCHC: 34.5 g/dL (ref 32.0–36.0)
MCV: 91.5 fL (ref 80.0–100.0)
MPV: 11.5 fL (ref 7.5–12.5)
Monocytes Relative: 9.4 %
Neutro Abs: 6271 cells/uL (ref 1500–7800)
Neutrophils Relative %: 60.3 %
Platelets: 271 10*3/uL (ref 140–400)
RBC: 5.17 10*6/uL (ref 4.20–5.80)
RDW: 12.8 % (ref 11.0–15.0)
Total Lymphocyte: 26.4 %
WBC: 10.4 10*3/uL (ref 3.8–10.8)

## 2020-01-29 LAB — COMPLETE METABOLIC PANEL WITH GFR
AG Ratio: 1.9 (calc) (ref 1.0–2.5)
ALT: 27 U/L (ref 9–46)
AST: 25 U/L (ref 10–35)
Albumin: 4.6 g/dL (ref 3.6–5.1)
Alkaline phosphatase (APISO): 71 U/L (ref 35–144)
BUN: 12 mg/dL (ref 7–25)
CO2: 26 mmol/L (ref 20–32)
Calcium: 9.6 mg/dL (ref 8.6–10.3)
Chloride: 105 mmol/L (ref 98–110)
Creat: 0.8 mg/dL (ref 0.70–1.25)
GFR, Est African American: 106 mL/min/{1.73_m2} (ref >=60)
GFR, Est Non African American: 91 mL/min/{1.73_m2} (ref >=60)
Globulin: 2.4 g/dL (calc) (ref 1.9–3.7)
Glucose, Bld: 115 mg/dL — ABNORMAL HIGH (ref 65–99)
Potassium: 5 mmol/L (ref 3.5–5.3)
Sodium: 140 mmol/L (ref 135–146)
Total Bilirubin: 0.3 mg/dL (ref 0.2–1.2)
Total Protein: 7 g/dL (ref 6.1–8.1)

## 2020-01-29 LAB — TEST AUTHORIZATION

## 2020-01-29 LAB — LIPID PANEL
Cholesterol: 163 mg/dL (ref <200)
HDL: 35 mg/dL — ABNORMAL LOW (ref >=40)
LDL Cholesterol (Calc): 103 mg/dL (calc) — ABNORMAL HIGH
Non-HDL Cholesterol (Calc): 128 mg/dL (calc) (ref <130)
Total CHOL/HDL Ratio: 4.7 (calc) (ref <5.0)
Triglycerides: 157 mg/dL — ABNORMAL HIGH (ref <150)

## 2020-01-29 LAB — PSA: PSA: 0.27 ng/mL (ref <=4.0)

## 2020-01-29 LAB — HEMOGLOBIN A1C W/OUT EAG: Hgb A1c MFr Bld: 6.8 % of total Hgb — ABNORMAL HIGH (ref <5.7)

## 2020-02-14 ENCOUNTER — Other Ambulatory Visit: Payer: Self-pay

## 2020-02-14 ENCOUNTER — Ambulatory Visit
Admission: RE | Admit: 2020-02-14 | Discharge: 2020-02-14 | Disposition: A | Discharge status: Home / Self Care | Payer: Medicare Other | Source: Ambulatory Visit | Attending: Family Medicine | Admitting: Family Medicine

## 2020-02-14 DIAGNOSIS — Z122 Encounter for screening for malignant neoplasm of respiratory organs: Secondary | ICD-10-CM

## 2020-03-04 ENCOUNTER — Telehealth (INDEPENDENT_AMBULATORY_CARE_PROVIDER_SITE_OTHER): Payer: Medicare Other | Admitting: Family Medicine

## 2020-03-04 ENCOUNTER — Other Ambulatory Visit: Payer: Self-pay

## 2020-03-04 DIAGNOSIS — I2510 Atherosclerotic heart disease of native coronary artery without angina pectoris: Secondary | ICD-10-CM | POA: Diagnosis not present

## 2020-03-04 DIAGNOSIS — Z122 Encounter for screening for malignant neoplasm of respiratory organs: Secondary | ICD-10-CM

## 2020-03-04 DIAGNOSIS — Z1211 Encounter for screening for malignant neoplasm of colon: Secondary | ICD-10-CM | POA: Diagnosis not present

## 2020-03-04 MED ORDER — ATORVASTATIN CALCIUM 80 MG PO TABS: 80.0000 mg | ORAL_TABLET | Freq: Every day | ORAL | 3 refills | Status: DC

## 2020-03-04 MED ORDER — ATORVASTATIN CALCIUM 40 MG PO TABS
40.0000 mg | ORAL_TABLET | Freq: Every day | ORAL | 3 refills | Status: DC
Start: 2020-03-04 — End: 2020-03-04

## 2020-03-04 NOTE — Progress Notes (Signed)
Subjective:    Patient ID: Vincent Short, male    DOB: [birth year], 69 y.o.   MRN: [id number]  HPI  Patient is being seen today as a telephone visit.  Phone call began at 205.  Phone call concluded at 220.  I ordered a CT scan to screen for lung cancer in a smoker over the age of 22 with a 30-pack-year history of smoking.  The results of the CT scan are below: FINDINGS: Cardiovascular: Heart size is normal. Aortic atherosclerosis. Coronary artery atherosclerotic calcifications. No pericardial effusion.  Mediastinum/Nodes: No enlarged mediastinal, hilar, or axillary lymph nodes. Thyroid gland, trachea, and esophagus demonstrate no significant findings.  Lungs/Pleura: Centrilobular and paraseptal emphysema. Status post right upper lobectomy. No acute or suspicious osseous findings identified. Small noncalcified nodule identified within the lateral left upper lobe with an equivalent diameter of 3.5 mm. Tiny calcified granuloma is identified within the periphery of the right middle lobe.  Upper Abdomen: No acute abnormality. Aortic atherosclerosis. Gallstone noted. Left kidney cyst is incompletely characterized without IV contrast.  Musculoskeletal: No chest wall mass or suspicious bone lesions identified.  IMPRESSION: 1. Lung-RADS 2, benign appearance or behavior. Continue annual screening with low-dose chest CT without contrast in 12 months. 2. Coronary artery calcifications noted. 3. Gallstone.  Patient had questions regarding the findings on the CT scan.  Unfortunately he was unable to reach [place] and after having left several messages, he scheduled a phone visit with me to discuss the findings.  He is most concerned about the mention of coronary artery atherosclerosis.  He denies any chest pain.  He denies any angina.  He denies any dyspnea on exertion.  He also was curious about the gallstone but he denies any right upper quadrant pain. Past Medical History:  Diagnosis  Date  . Allergic rhinitis   . Asthma   . Atelectasis    Rt upper lobe  . COPD (chronic obstructive pulmonary disease) ([place])   . GERD (gastroesophageal reflux disease)   . History of colon polyps 03/14/2009   Tubular adenomatous and Hyperplastic polyps  . HTN (hypertension)   . Hyperlipidemia   . Lung mass    noted on CT scan(12/24/10)   . S/P cardiac cath    negative  . Tobacco abuse   . Treadmill stress test negative for angina pectoris    Dr. Claiborne Billings   Past Surgical History:  Procedure Laterality Date  . Arthroscopy left wrist with radial styloidectomy.  07/06/2000   Dr Fredna Dow  . Bronchoscopy with airway inspection, brush cytology,  11/22/2006   Dr Halford Chessman  . CARDIAC CATHETERIZATION  07/05/2002   Dr Horton Marshall LVF, SMOOTH 10% TO 20% OSTIAL AND 10%-20% LUMINAL IRREGULARITY OF THE PROXIMAL LAD, SMOOTH 30%-40% PROXIMAL RCA,WHICH IMPROVED FOLLOWING INTRACORONARY NTG ADMINISTRATION SUGGESTING CORONARY VASOSPASM. NO AORTOILIAC DISEASE.  Marland Kitchen DOPPLER ECHOCARDIOGRAPHY N/A 06/22/08   LV SIZE IS NORMAL. LV FUNCTION IS NORMAL. EF=>55%. THERE IS BORDERLINE CONCENTRIC LVH.  Marland Kitchen FRACTURE SURGERY     Left wrist scaphoid fx  . FRACTURE SURGERY     Left ankle surgery  . Left ankle surgery  11/11/2005   Dr Noemi Chapel  . LUNG SURGERY  01/21/11   Dr. Cyndia Bent   FOB, R THORACOTOMY WITH RIGHT UPPER LOBECTOMY  . LUNG SURGERY  01/21/11   FOB, R THORACOTOMY WITH RIGHT UPPER LOBECTOMY  . NASAL SINUS SURGERY  1990  . NUCLEAR STRESS TEST N/A 09/09/10   NORMAL PATTERN OF PERFUSION IN ALL REGIONS. EF IS 63%. NO WALL  MOTION ABNORMALITIES.  Marland Kitchen ROTATOR CUFF REPAIR  pending   Dr. Noemi Chapel  . TONSILLECTOMY     Current Outpatient Medications on File Prior to Visit  Medication Sig Dispense Refill  . albuterol (PROVENTIL HFA;VENTOLIN HFA) 108 (90 Base) MCG/ACT inhaler Inhale 2 puffs into the lungs every 6 (six) hours as needed for wheezing or shortness of breath. 1 Inhaler 5  . albuterol (PROVENTIL) (2.5 MG/3ML) 0.083%  nebulizer solution Take 3 mLs (2.5 mg total) by nebulization every 6 (six) hours as needed for wheezing or shortness of breath. 150 mL 1  . ALPRAZolam (XANAX) 0.5 MG tablet Take 1 tablet (0.5 mg total) by mouth 3 (three) times daily as needed for anxiety. 60 tablet 0  . amLODipine (NORVASC) 10 MG tablet TAKE 1 TABLET BY MOUTH EVERY DAY 90 tablet 3  . budesonide-formoterol (SYMBICORT) 160-4.5 MCG/ACT inhaler Inhale 2 puffs into the lungs 2 (two) times daily.      . cetirizine (ZYRTEC) 10 MG tablet Take 10 mg by mouth daily.    Marland Kitchen dicyclomine (BENTYL) 20 MG tablet Take 1 tablet (20 mg total) by mouth 4 (four) times daily as needed for spasms. 30 tablet 0  . fluticasone (FLONASE) 50 MCG/ACT nasal spray PLACE 2 SPRAYS INTO THE NOSTRIL(S) DAILY 48 g 3  . ipratropium (ATROVENT) 0.06 % nasal spray Place 2 sprays into both nostrils 4 (four) times daily. 15 mL 12  . losartan (COZAAR) 100 MG tablet TAKE 1 TABLET BY MOUTH EVERY DAY 30 tablet 3  . metoprolol succinate (TOPROL-XL) 25 MG 24 hr tablet TAKE 1 TABLET BY MOUTH EVERY DAY 90 tablet 3  . montelukast (SINGULAIR) 10 MG tablet TAKE 1 TABLET BY MOUTH EVERYDAY AT BEDTIME 90 tablet 3  . Multiple Vitamin (MULTIVITAMIN) tablet Take 1 tablet by mouth daily.    . niacin (NIASPAN) 1000 MG CR tablet TAKE 1 TABLET (1,000 MG TOTAL) BY MOUTH AT BEDTIME. 30 tablet 5  . sildenafil (VIAGRA) 100 MG tablet TAKE 1/2 TO 1 TABLETS BY MOUTH DAILY AS NEEDED FOR ERECTILE DYSFUNCTION. 10 tablet 11   No current facility-administered medications on file prior to visit.   Allergies  Allergen Reactions  . Contrast Media [Iodinated Diagnostic Agents] Swelling    Allergic reaction 1987, ok w/ 13 hr prep//a.c.   Social History   Socioeconomic History  . Marital status: Married    Spouse name: Not on file  . Number of children: Not on file  . Years of education: Not on file  . Highest education level: Not on file  Occupational History  . Occupation: Tourist information centre manager     Employer: [employer]  Tobacco Use  . Smoking status: Former Smoker    Types: Cigarettes    Quit date: 12/29/2010    Years since quitting: 9.1  . Smokeless tobacco: Never Used  Substance and Sexual Activity  . Alcohol use: Yes  . Drug use: No  . Sexual activity: Not on file  Other Topics Concern  . Not on file  Social History Narrative    He is married.  He works in Water quality scientist.  He smoked 1 pack of cigarettes a day for the last 35     years.  He has occasional alcohol use.    Social Determinants of Health   Financial Resource Strain:   . Difficulty of Paying Living Expenses: Not on file  Food Insecurity:   . Worried About Charity fundraiser in the Last  Year: Not on file  . Ran Out of Food in the Last Year: Not on file  Transportation Needs:   . Lack of Transportation (Medical): Not on file  . Lack of Transportation (Non-Medical): Not on file  Physical Activity:   . Days of Exercise per Week: Not on file  . Minutes of Exercise per Session: Not on file  Stress:   . Feeling of Stress : Not on file  Social Connections:   . Frequency of Communication with Friends and Family: Not on file  . Frequency of Social Gatherings with Friends and Family: Not on file  . Attends Religious Services: Not on file  . Active Member of Clubs or Organizations: Not on file  . Attends Archivist Meetings: Not on file  . Marital Status: Not on file  Intimate Partner Violence:   . Fear of Current or Ex-Partner: Not on file  . Emotionally Abused: Not on file  . Physically Abused: Not on file  . Sexually Abused: Not on file     Review of Systems  All other systems reviewed and are negative.      Objective:   Physical Exam        Assessment & Plan:  .Screen for colon cancer - Plan: Ambulatory referral to Gastroenterology  Encounter for screening for lung cancer  Coronary artery calcification seen on CT scan  Patient states that he has not  received any communication regarding a colonoscopy.  Therefore I will place another order for GI consult so that he can get a screening colonoscopy for colon cancer.  I believe the 3.5 mm pulmonary nodule seen on the left side is likely a benign finding.  Recommended a repeat CT scan in 1 year.  The gallstone is asymptomatic.  Therefore the patient was instructed of the symptoms to watch for.  If he develops right upper quadrant pain we can certainly refer to a general surgeon to have his gallbladder removed.  Lastly, the patient does have coronary artery calcifications on the CT scan but at the present time they are asymptomatic.  He specifically denies any angina, chest pain, dyspnea on exertion.  Therefore I recommended medical management.  Take an aspirin 81 mg daily.  Maximize his dose of statin Lipitor 80 mg a day and manage blood pressure.  Also counseled the patient to quit smoking.  If the patient develops any chest pain or shortness of breath or dyspnea on exertion I would recommend a cardiology consultation for a stress test.

## 2020-03-05 ENCOUNTER — Encounter: Payer: Self-pay | Admitting: Family Medicine

## 2020-03-06 ENCOUNTER — Encounter (INDEPENDENT_AMBULATORY_CARE_PROVIDER_SITE_OTHER): Payer: Self-pay | Admitting: *Deleted

## 2020-05-08 ENCOUNTER — Telehealth: Payer: Self-pay

## 2020-05-08 ENCOUNTER — Telehealth: Payer: Self-pay | Admitting: Family Medicine

## 2020-05-08 NOTE — Telephone Encounter (Signed)
Patient called needing new supply for Cpap machine, prescription written signed by PCP partner K. Buelah Manis, MD

## 2020-05-08 NOTE — Telephone Encounter (Signed)
ERROR

## 2020-06-12 ENCOUNTER — Other Ambulatory Visit: Payer: Self-pay | Admitting: Family Medicine

## 2020-06-24 ENCOUNTER — Telehealth: Payer: Self-pay | Admitting: *Deleted

## 2020-06-24 NOTE — Telephone Encounter (Signed)
Switch to valsartan 320 mg poqday and dc losartan.

## 2020-06-24 NOTE — Telephone Encounter (Signed)
Received fax requesting alternative to Losartan 100mg .   Reports that all losartan products are on backorder.   Please advise.

## 2020-06-25 MED ORDER — VALSARTAN 320 MG PO TABS: 320.0000 mg | ORAL_TABLET | Freq: Every day | ORAL | 1 refills | Status: DC

## 2020-06-25 NOTE — Telephone Encounter (Signed)
Prescription sent to pharmacy.   Patient aware per MyChart.  

## 2020-06-26 ENCOUNTER — Telehealth: Payer: Self-pay | Admitting: Family Medicine

## 2020-06-26 NOTE — Telephone Encounter (Signed)
Patient needs all medications to go to [facility] he would like losartan instead of the new medication valsartan. He is going to start using [facility] as his pharmacy.  CB# [phone]

## 2020-07-03 ENCOUNTER — Telehealth: Payer: Self-pay

## 2020-07-03 MED ORDER — LOSARTAN POTASSIUM 100 MG PO TABS
100.0000 mg | ORAL_TABLET | Freq: Every day | ORAL | 1 refills | Status: DC
Start: 2020-07-03 — End: 2020-10-01

## 2020-07-03 NOTE — Telephone Encounter (Signed)
Patient called never received his med valsartan (DIOVAN) 320 MG tablet.  This med ost $100.00 for rx valsartan (DIOVAN) 320 MG tablet  Please contact patient# [phone] he would like to go back to [place] 90 day supply cost 4.00 vs 100.00  He only has 1 pill left. Needs REFILL today.  Send to pharmacy: [address] in [place].

## 2020-07-03 NOTE — Telephone Encounter (Signed)
Prescription sent to pharmacy.   Of note, losartan is on back order. May ned medication change if unable to fill.

## 2020-07-03 NOTE — Addendum Note (Signed)
Addended by: Sheral Flow on: 07/03/2020 04:35 PM   Modules accepted: Orders

## 2020-07-04 ENCOUNTER — Other Ambulatory Visit: Payer: Self-pay | Admitting: Family Medicine

## 2020-07-19 DIAGNOSIS — L03317 Cellulitis of buttock: Secondary | ICD-10-CM | POA: Diagnosis not present

## 2020-07-19 DIAGNOSIS — L0231 Cutaneous abscess of buttock: Secondary | ICD-10-CM | POA: Diagnosis not present

## 2020-08-07 ENCOUNTER — Other Ambulatory Visit: Payer: Self-pay | Admitting: Family Medicine

## 2020-09-10 ENCOUNTER — Other Ambulatory Visit: Payer: Self-pay | Admitting: Orthopedic Surgery

## 2020-09-10 DIAGNOSIS — M1611 Unilateral primary osteoarthritis, right hip: Secondary | ICD-10-CM | POA: Diagnosis not present

## 2020-09-10 DIAGNOSIS — M25511 Pain in right shoulder: Secondary | ICD-10-CM

## 2020-09-10 DIAGNOSIS — S46011A Strain of muscle(s) and tendon(s) of the rotator cuff of right shoulder, initial encounter: Secondary | ICD-10-CM | POA: Diagnosis not present

## 2020-09-10 DIAGNOSIS — M25512 Pain in left shoulder: Secondary | ICD-10-CM | POA: Diagnosis not present

## 2020-09-10 DIAGNOSIS — M170 Bilateral primary osteoarthritis of knee: Secondary | ICD-10-CM | POA: Diagnosis not present

## 2020-09-21 ENCOUNTER — Ambulatory Visit
Admission: RE | Admit: 2020-09-21 | Discharge: 2020-09-21 | Disposition: A | Discharge status: Home / Self Care | Payer: Medicare Other | Source: Ambulatory Visit | Attending: Orthopedic Surgery | Admitting: Orthopedic Surgery

## 2020-09-21 DIAGNOSIS — M25511 Pain in right shoulder: Secondary | ICD-10-CM

## 2020-09-29 ENCOUNTER — Other Ambulatory Visit: Payer: Self-pay | Admitting: Family Medicine

## 2020-11-27 ENCOUNTER — Other Ambulatory Visit: Payer: Self-pay | Admitting: Family Medicine

## 2020-11-27 DIAGNOSIS — J329 Chronic sinusitis, unspecified: Secondary | ICD-10-CM

## 2020-12-23 DIAGNOSIS — U071 COVID-19: Secondary | ICD-10-CM | POA: Diagnosis not present

## 2020-12-24 DIAGNOSIS — U071 COVID-19: Secondary | ICD-10-CM | POA: Diagnosis not present

## 2020-12-25 ENCOUNTER — Telehealth: Payer: Self-pay

## 2020-12-25 ENCOUNTER — Other Ambulatory Visit: Payer: Self-pay | Admitting: Family Medicine

## 2020-12-25 MED ORDER — NIRMATRELVIR/RITONAVIR (PAXLOVID)TABLET: 3.0000 | ORAL_TABLET | Freq: Two times a day (BID) | ORAL | 0 refills | Status: AC

## 2020-12-25 NOTE — Telephone Encounter (Signed)
Pt callled in stating that he has now tested positive for covid. Pt would like to get a prescription sent to pharmacy for symptoms. Please advise.  Cb#: [phone]

## 2020-12-25 NOTE — Telephone Encounter (Signed)
Patient tested positive for COVID on 12/25/2020.   Sx began 12/23/2020 and include cough, sore throat and body aches. .  Advised to continue symptom management with OTC medications: Tylenol/ Ibuprofen for fever/ body aches, Mucinex/ Delsym for cough/ chest congestion, Afrin/Sudafed/nasal saline for sinus pressure/ nasal congestion.  GFR 91. Order for Paxlovid sent to pharmacy. Advised possible SE include nausea, diarrhea, loss of taste and hypertension.  If chest pain, shortness of breath, fever >104 that is unresponsive to antipyretics noted, or if unable to tolerate fluids, advised to go to ER for evaluation.   Advised that the CDC recommends the following criteria prior to ending isolation in vaccinated and unvaccinated persons: at least 5 days since symptoms onset, then an additional 5 days of masking  AND 3 days fever free without antipyretics (Tylenol or Ibuprofen) AND improvement in respiratory symptoms.

## 2021-02-10 ENCOUNTER — Other Ambulatory Visit: Payer: Self-pay

## 2021-02-10 ENCOUNTER — Ambulatory Visit (INDEPENDENT_AMBULATORY_CARE_PROVIDER_SITE_OTHER): Payer: Medicare Other | Admitting: Family Medicine

## 2021-02-10 VITALS — BP 128/86 | HR 96 | Temp 98.2°F | Resp 16 | Ht 72.0 in | Wt 214.0 lb

## 2021-02-10 DIAGNOSIS — R5383 Other fatigue: Secondary | ICD-10-CM

## 2021-02-10 DIAGNOSIS — Z8639 Personal history of other endocrine, nutritional and metabolic disease: Secondary | ICD-10-CM

## 2021-02-10 DIAGNOSIS — R251 Tremor, unspecified: Secondary | ICD-10-CM

## 2021-02-10 DIAGNOSIS — R2689 Other abnormalities of gait and mobility: Secondary | ICD-10-CM | POA: Diagnosis not present

## 2021-02-10 DIAGNOSIS — U071 COVID-19: Secondary | ICD-10-CM | POA: Diagnosis not present

## 2021-02-10 NOTE — Progress Notes (Signed)
Subjective:    Patient ID: Vincent Short, male    DOB: 1951-04-05, 70 y.o.   MRN: [id number]  HPI Patient was diagnosed with COVID in September.  He had a severe "bronchitis".  He took paxlovid and gradually recovered.  However ever since that time, he is felt extremely weak and tired.  He reports feeling nervous fluids.  He has a high-frequency resting tremor in both hands.  It looks like an essential tremor.  There is no pill-rolling component.  I watched him walking.  There is no foot drop.  He does not drag his feet or have a shuffling gait.  He is able to turn normally.  He shows no evidence of any ataxia.  However then, I had the patient sit back down on the exam table to discuss the situation with him.  When he stood up, the patient almost lost his balance and had to reach out and grab onto the table.  However he denies any vertigo.  He states that he does not feel the room spinning.  He denies any sense of motion.  He states that he just loses his balance from time to time.  He denies any memory loss.  He does feel anxious.  He also has a high resting heart rate. Past Medical History:  Diagnosis Date   Allergic rhinitis    Asthma    Atelectasis    Rt upper lobe   COPD (chronic obstructive pulmonary disease) ([facility])    GERD (gastroesophageal reflux disease)    History of colon polyps 03/14/2009   Tubular adenomatous and Hyperplastic polyps   HTN (hypertension)    Hyperlipidemia    Lung mass    noted on CT scan(12/24/10)    S/P cardiac cath    negative   Tobacco abuse    Treadmill stress test negative for angina pectoris    Dr. Claiborne Billings   Past Surgical History:  Procedure Laterality Date   Arthroscopy left wrist with radial styloidectomy.  07/06/2000   Dr Fredna Dow   Bronchoscopy with airway inspection, brush cytology,  11/22/2006   Dr Halford Chessman   CARDIAC CATHETERIZATION  07/05/2002   Dr Horton Marshall LVF, SMOOTH 10% TO 20% OSTIAL AND 10%-20% LUMINAL IRREGULARITY OF THE PROXIMAL LAD,  SMOOTH 30%-40% PROXIMAL RCA,WHICH IMPROVED FOLLOWING INTRACORONARY NTG ADMINISTRATION SUGGESTING CORONARY VASOSPASM. NO AORTOILIAC DISEASE.   DOPPLER ECHOCARDIOGRAPHY N/A 06/22/08   LV SIZE IS NORMAL. LV FUNCTION IS NORMAL. EF=>55%. THERE IS BORDERLINE CONCENTRIC LVH.   FRACTURE SURGERY     Left wrist scaphoid fx   FRACTURE SURGERY     Left ankle surgery   Left ankle surgery  11/11/2005   Dr Noemi Chapel   LUNG SURGERY  01/21/11   Dr. Cyndia Bent   FOB, R THORACOTOMY WITH RIGHT UPPER LOBECTOMY   LUNG SURGERY  01/21/11   FOB, R THORACOTOMY WITH RIGHT UPPER LOBECTOMY   NASAL SINUS SURGERY  1990   NUCLEAR STRESS TEST N/A 09/09/10   NORMAL PATTERN OF PERFUSION IN ALL REGIONS. EF IS 63%. NO WALL MOTION ABNORMALITIES.   ROTATOR CUFF REPAIR  pending   Dr. Noemi Chapel   TONSILLECTOMY     Current Outpatient Medications on File Prior to Visit  Medication Sig Dispense Refill   albuterol (PROVENTIL HFA;VENTOLIN HFA) 108 (90 Base) MCG/ACT inhaler Inhale 2 puffs into the lungs every 6 (six) hours as needed for wheezing or shortness of breath. 1 Inhaler 5   albuterol (PROVENTIL) (2.5 MG/3ML) 0.083% nebulizer solution Take 3 mLs (2.5 mg total)  by nebulization every 6 (six) hours as needed for wheezing or shortness of breath. 150 mL 1   ALPRAZolam (XANAX) 0.5 MG tablet Take 1 tablet (0.5 mg total) by mouth 3 (three) times daily as needed for anxiety. 60 tablet 0   amLODipine (NORVASC) 10 MG tablet TAKE 1 TABLET BY MOUTH EVERY DAY 90 tablet 3   atorvastatin (LIPITOR) 80 MG tablet TAKE 1 TABLET BY MOUTH EVERY DAY 90 tablet 3   budesonide-formoterol (SYMBICORT) 160-4.5 MCG/ACT inhaler Inhale 2 puffs into the lungs 2 (two) times daily.       cetirizine (ZYRTEC) 10 MG tablet Take 10 mg by mouth daily.     fluticasone (FLONASE) 50 MCG/ACT nasal spray PLACE 2 SPRAYS INTO THE NOSTRIL(S) DAILY 48 g 3   ipratropium (ATROVENT) 0.06 % nasal spray Place 2 sprays into both nostrils 4 (four) times daily. 15 mL 12   losartan (COZAAR) 100  MG tablet TAKE 1 TABLET(100 MG) BY MOUTH DAILY 90 tablet 1   metoprolol succinate (TOPROL-XL) 25 MG 24 hr tablet TAKE 1 TABLET BY MOUTH EVERY DAY 90 tablet 3   montelukast (SINGULAIR) 10 MG tablet TAKE 1 TABLET BY MOUTH EVERYDAY AT BEDTIME 90 tablet 3   Multiple Vitamin (MULTIVITAMIN) tablet Take 1 tablet by mouth daily.     niacin (NIASPAN) 1000 MG CR tablet TAKE 1 TABLET (1,000 MG TOTAL) BY MOUTH AT BEDTIME. 30 tablet 5   No current facility-administered medications on file prior to visit.   Allergies  Allergen Reactions   Contrast Media [Iodinated Diagnostic Agents] Swelling    Allergic reaction 1987, ok w/ 13 hr prep//a.c.   Social History   Socioeconomic History   Marital status: Married    Spouse name: Not on file   Number of children: Not on file   Years of education: Not on file   Highest education level: Not on file  Occupational History   Occupation: heating and AC    Employer: [employer]  Tobacco Use   Smoking status: Former    Types: Cigarettes    Quit date: 12/29/2010    Years since quitting: 10.1   Smokeless tobacco: Never  Substance and Sexual Activity   Alcohol use: Yes   Drug use: No   Sexual activity: Not on file  Other Topics Concern   Not on file  Social History Narrative    He is married.  He works in Water quality scientist.  He smoked 1 pack of cigarettes a day for the last 35     years.  He has occasional alcohol use.    Social Determinants of Health   Financial Resource Strain: Not on file  Food Insecurity: Not on file  Transportation Needs: Not on file  Physical Activity: Not on file  Stress: Not on file  Social Connections: Not on file  Intimate Partner Violence: Not on file     Review of Systems  All other systems reviewed and are negative.     Objective:   Physical Exam Vitals reviewed.  Constitutional:      Appearance: Normal appearance. He is normal weight.  HENT:     Right Ear: Tympanic membrane and ear canal  normal.     Left Ear: Tympanic membrane and ear canal normal.     Mouth/Throat:     Pharynx: No oropharyngeal exudate.  Eyes:     Extraocular Movements: Extraocular movements intact.     Conjunctiva/sclera: Conjunctivae normal.  Pupils: Pupils are equal, round, and reactive to light.  Neck:     Thyroid: No thyroid mass, thyromegaly or thyroid tenderness.     Vascular: No carotid bruit or JVD.  Cardiovascular:     Rate and Rhythm: Tachycardia present.     Heart sounds: Normal heart sounds. No murmur heard.   No friction rub. No gallop.  Pulmonary:     Effort: Pulmonary effort is normal. No respiratory distress.     Breath sounds: Normal breath sounds. No wheezing or rales.  Abdominal:     General: Bowel sounds are normal. There is no distension.     Palpations: Abdomen is soft.     Tenderness: There is no abdominal tenderness. There is no guarding or rebound.  Musculoskeletal:     Cervical back: Neck supple.     Right lower leg: No edema.     Left lower leg: No edema.  Lymphadenopathy:     Cervical: No cervical adenopathy.  Skin:    Coloration: Skin is not jaundiced.     Findings: No bruising, erythema, lesion or rash.  Neurological:     General: No focal deficit present.     Mental Status: He is alert and oriented to person, place, and time. Mental status is at baseline.     Cranial Nerves: No cranial nerve deficit.     Motor: No weakness.     Gait: Gait normal.  Psychiatric:        Mood and Affect: Mood normal.        Behavior: Behavior normal.        Thought Content: Thought content normal.        Judgment: Judgment normal.          Assessment & Plan:   Fatigue, unspecified type - Plan: CBC with Differential/Platelet, COMPLETE METABOLIC PANEL WITH GFR, TSH, Vitamin B12  History of non anemic vitamin B12 deficiency - Plan: Vitamin B12  COVID-19  Tremor  Balance disorder I am concerned that his fatigue may be long COVID post viral fatigue.  However, given  his tremor I am also concerned about thyroid dysfunction.  Therefore we will check a TSH.  Given his balance issues, neuropathy could be a component so we will check a B12 in addition to his TSH.  He states that he was told he had a history of B12 deficiency in the past which is another reason I like to check his B12 level which can certainly cause fatigue and balance issues.  I believe that he may be also having orthostatic hypotension if he is not experiencing vertigo.  Therefore of asked him to temporarily hold his amlodipine.  Reassess in 1 week off the amlodipine after we have the lab work back to determine the next step.  Anxiety is also on the differential diagnosis however I do not feel that anxiety would explain the issues he is having with balance when he stands up.  Therefore if his symptoms do not improve after holding the amlodipine, I would pursue imaging of the brain

## 2021-02-11 ENCOUNTER — Encounter: Payer: Self-pay | Admitting: Family Medicine

## 2021-02-12 ENCOUNTER — Telehealth: Payer: Self-pay

## 2021-02-12 NOTE — Telephone Encounter (Signed)
Pt  called in stating that he is very anxious to discuss his lab results from last office visit. Please advise.  Cb#: [phone]

## 2021-02-13 ENCOUNTER — Other Ambulatory Visit: Payer: Self-pay | Admitting: Family Medicine

## 2021-02-13 DIAGNOSIS — R270 Ataxia, unspecified: Secondary | ICD-10-CM

## 2021-02-14 ENCOUNTER — Other Ambulatory Visit: Payer: Self-pay | Admitting: *Deleted

## 2021-02-14 ENCOUNTER — Other Ambulatory Visit: Payer: Self-pay

## 2021-02-14 ENCOUNTER — Ambulatory Visit
Admission: RE | Admit: 2021-02-14 | Discharge: 2021-02-14 | Disposition: A | Discharge status: Home / Self Care | Payer: Medicare Other | Source: Ambulatory Visit | Attending: Family Medicine | Admitting: Family Medicine

## 2021-02-14 ENCOUNTER — Telehealth: Payer: Self-pay | Admitting: Family Medicine

## 2021-02-14 DIAGNOSIS — D72829 Elevated white blood cell count, unspecified: Secondary | ICD-10-CM | POA: Diagnosis not present

## 2021-02-14 DIAGNOSIS — E119 Type 2 diabetes mellitus without complications: Secondary | ICD-10-CM

## 2021-02-14 LAB — COMPLETE METABOLIC PANEL WITH GFR
AG Ratio: 1.6 (calc) (ref 1.0–2.5)
ALT: 23 U/L (ref 9–46)
AST: 22 U/L (ref 10–35)
Albumin: 4.6 g/dL (ref 3.6–5.1)
Alkaline phosphatase (APISO): 67 U/L (ref 35–144)
BUN: 20 mg/dL (ref 7–25)
CO2: 24 mmol/L (ref 20–32)
Calcium: 10.2 mg/dL (ref 8.6–10.3)
Chloride: 103 mmol/L (ref 98–110)
Creat: 0.8 mg/dL (ref 0.70–1.28)
Globulin: 2.8 g/dL (calc) (ref 1.9–3.7)
Glucose, Bld: 124 mg/dL — ABNORMAL HIGH (ref 65–99)
Potassium: 4.7 mmol/L (ref 3.5–5.3)
Sodium: 138 mmol/L (ref 135–146)
Total Bilirubin: 0.4 mg/dL (ref 0.2–1.2)
Total Protein: 7.4 g/dL (ref 6.1–8.1)
eGFR: 95 mL/min/{1.73_m2} (ref >=60)

## 2021-02-14 LAB — CBC WITH DIFFERENTIAL/PLATELET
Absolute Monocytes: 1173 cells/uL — ABNORMAL HIGH (ref 200–950)
Basophils Absolute: 86 cells/uL (ref 0–200)
Basophils Relative: 0.6 %
Eosinophils Absolute: 286 cells/uL (ref 15–500)
Eosinophils Relative: 2 %
HCT: 42.3 % (ref 38.5–50.0)
Hemoglobin: 14.3 g/dL (ref 13.2–17.1)
Lymphs Abs: 2817 cells/uL (ref 850–3900)
MCH: 31.1 pg (ref 27.0–33.0)
MCHC: 33.8 g/dL (ref 32.0–36.0)
MCV: 92 fL (ref 80.0–100.0)
MPV: 11.2 fL (ref 7.5–12.5)
Monocytes Relative: 8.2 %
Neutro Abs: 9939 cells/uL — ABNORMAL HIGH (ref 1500–7800)
Neutrophils Relative %: 69.5 %
Platelets: 341 10*3/uL (ref 140–400)
RBC: 4.6 10*6/uL (ref 4.20–5.80)
RDW: 12.3 % (ref 11.0–15.0)
Total Lymphocyte: 19.7 %
WBC: 14.3 10*3/uL — ABNORMAL HIGH (ref 3.8–10.8)

## 2021-02-14 LAB — VITAMIN B12: Vitamin B-12: 666 pg/mL (ref 200–1100)

## 2021-02-14 LAB — TSH: TSH: 1.24 mIU/L (ref 0.40–4.50)

## 2021-02-14 LAB — HEMOGLOBIN A1C W/OUT EAG: Hgb A1c MFr Bld: 7.8 % of total Hgb — ABNORMAL HIGH (ref <5.7)

## 2021-02-14 MED ORDER — BLOOD GLUCOSE SYSTEM PAK KIT: PACK | 1 refills | Status: DC

## 2021-02-14 MED ORDER — METFORMIN HCL ER 500 MG PO TB24: 1000.0000 mg | ORAL_TABLET | Freq: Every day | ORAL | 3 refills | Status: DC

## 2021-02-14 MED ORDER — BLOOD GLUCOSE TEST VI STRP: ORAL_STRIP | 11 refills | Status: DC

## 2021-02-14 MED ORDER — LANCETS MISC: 1 refills | Status: DC

## 2021-02-14 NOTE — Telephone Encounter (Signed)
Patient called to follow up on x-ray and MRI ordered by provider to address tremor, fatigue, and weakness. Patient requested call from provider and  stated he never received it; requesting call directly from provider for explanation of why MRI is being ordered. Patient doesn't want to schedule appt for MRI until able to speak with provider. Please advise at [phone].

## 2021-02-25 DIAGNOSIS — L02221 Furuncle of abdominal wall: Secondary | ICD-10-CM | POA: Diagnosis not present

## 2021-02-25 DIAGNOSIS — L711 Rhinophyma: Secondary | ICD-10-CM | POA: Diagnosis not present

## 2021-02-25 DIAGNOSIS — L02222 Furuncle of back [any part, except buttock]: Secondary | ICD-10-CM | POA: Diagnosis not present

## 2021-02-25 DIAGNOSIS — L718 Other rosacea: Secondary | ICD-10-CM | POA: Diagnosis not present

## 2021-03-04 ENCOUNTER — Ambulatory Visit
Admission: RE | Admit: 2021-03-04 | Discharge: 2021-03-04 | Disposition: A | Discharge status: Home / Self Care | Payer: Medicare Other | Source: Ambulatory Visit | Attending: Family Medicine | Admitting: Family Medicine

## 2021-03-04 ENCOUNTER — Other Ambulatory Visit: Payer: Self-pay

## 2021-03-04 DIAGNOSIS — I6782 Cerebral ischemia: Secondary | ICD-10-CM | POA: Diagnosis not present

## 2021-03-04 DIAGNOSIS — J3489 Other specified disorders of nose and nasal sinuses: Secondary | ICD-10-CM | POA: Diagnosis not present

## 2021-03-04 DIAGNOSIS — R270 Ataxia, unspecified: Secondary | ICD-10-CM

## 2021-03-04 DIAGNOSIS — I614 Nontraumatic intracerebral hemorrhage in cerebellum: Secondary | ICD-10-CM | POA: Diagnosis not present

## 2021-03-04 MED ORDER — GADOBENATE DIMEGLUMINE 529 MG/ML IV SOLN
20.0000 mL | Freq: Once | INTRAVENOUS | Status: AC | PRN
  Administered 2021-03-04: 20 mL via INTRAVENOUS

## 2021-03-05 ENCOUNTER — Telehealth: Payer: Self-pay | Admitting: Family Medicine

## 2021-03-05 NOTE — Telephone Encounter (Signed)
Patient called to request call back with MRI results. Please advise at [phone].

## 2021-03-05 NOTE — Telephone Encounter (Signed)
Provider has not reviewed imaging at this time.   Will call with results once reviewed.

## 2021-03-06 ENCOUNTER — Other Ambulatory Visit: Payer: Medicare Other

## 2021-03-11 DIAGNOSIS — Z23 Encounter for immunization: Secondary | ICD-10-CM | POA: Diagnosis not present

## 2021-03-19 ENCOUNTER — Encounter: Payer: Self-pay | Admitting: Nurse Practitioner

## 2021-03-19 ENCOUNTER — Telehealth (INDEPENDENT_AMBULATORY_CARE_PROVIDER_SITE_OTHER): Payer: Medicare Other | Admitting: Nurse Practitioner

## 2021-03-19 ENCOUNTER — Other Ambulatory Visit: Payer: Self-pay

## 2021-03-19 VITALS — BP 117/78 | HR 85

## 2021-03-19 DIAGNOSIS — J069 Acute upper respiratory infection, unspecified: Secondary | ICD-10-CM | POA: Diagnosis not present

## 2021-03-19 NOTE — Progress Notes (Signed)
Subjective:    Patient ID: Vincent Short, male    DOB: March 23, 1951, 70 y.o.   MRN: [id number]  HPI: Vincent Short is a 70 y.o. male presenting virtually for congestion and sneezing.  Chief Complaint  Patient presents with   URI    UPPER RESPIRATORY TRACT INFECTION Woke up Saturday morning with sneezing fit.   Onset: Saturday  Fever: no Cough: no Shortness of breath: no Wheezing: no Chest pain: no Chest tightness: no Chest congestion: no Nasal congestion: yes Runny nose: yes Post nasal drip: yes Sneezing: yes Sore throat: no Swollen glands: no Sinus pressure: yes; behind eyes Headache: no Face pain: no Toothache: no Ear pain: no  Ear pressure: no  Eyes red/itching:no Eye drainage/crusting: no  Nausea: no  Vomiting: no Diarrhea: no  Change in appetite:  no   Loss of taste/smell: no  Rash: no Fatigue: no Sick contacts: no Strep contacts: no  Context: fluctuating Recurrent sinusitis: no Treatments attempted: sinus, Benadryl, zyrtec,  Relief with OTC medications: no  Allergies  Allergen Reactions   Contrast Media [Iodinated Diagnostic Agents] Swelling    Allergic reaction 1987, ok w/ 13 hr prep//a.c.    Outpatient Encounter Medications as of 03/19/2021  Medication Sig   albuterol (PROVENTIL HFA;VENTOLIN HFA) 108 (90 Base) MCG/ACT inhaler Inhale 2 puffs into the lungs every 6 (six) hours as needed for wheezing or shortness of breath.   albuterol (PROVENTIL) (2.5 MG/3ML) 0.083% nebulizer solution Take 3 mLs (2.5 mg total) by nebulization every 6 (six) hours as needed for wheezing or shortness of breath.   ALPRAZolam (XANAX) 0.5 MG tablet Take 1 tablet (0.5 mg total) by mouth 3 (three) times daily as needed for anxiety.   atorvastatin (LIPITOR) 80 MG tablet TAKE 1 TABLET BY MOUTH EVERY DAY   Blood Glucose Monitoring Suppl (BLOOD GLUCOSE SYSTEM PAK) KIT Please dispense based on patient and insurance preference. Use as directed to monitor FSBS 1x daily.  Dx: E11.9.   budesonide-formoterol (SYMBICORT) 160-4.5 MCG/ACT inhaler Inhale 2 puffs into the lungs 2 (two) times daily.     cetirizine (ZYRTEC) 10 MG tablet Take 10 mg by mouth daily.   fluticasone (FLONASE) 50 MCG/ACT nasal spray PLACE 2 SPRAYS INTO THE NOSTRIL(S) DAILY   Glucose Blood (BLOOD GLUCOSE TEST STRIPS) STRP Please dispense based on patient and insurance preference. Use as directed to monitor FSBS 1x daily. Dx: E11.9.   ipratropium (ATROVENT) 0.06 % nasal spray Place 2 sprays into both nostrils 4 (four) times daily.   Lancets MISC Please dispense based on patient and insurance preference. Use as directed to monitor FSBS 1x daily. Dx: E11.9.   losartan (COZAAR) 100 MG tablet TAKE 1 TABLET(100 MG) BY MOUTH DAILY   metFORMIN (GLUCOPHAGE XR) 500 MG 24 hr tablet Take 2 tablets (1,000 mg total) by mouth daily with breakfast.   metoprolol succinate (TOPROL-XL) 25 MG 24 hr tablet TAKE 1 TABLET BY MOUTH EVERY DAY   montelukast (SINGULAIR) 10 MG tablet TAKE 1 TABLET BY MOUTH EVERYDAY AT BEDTIME   Multiple Vitamin (MULTIVITAMIN) tablet Take 1 tablet by mouth daily.   niacin (NIASPAN) 1000 MG CR tablet TAKE 1 TABLET (1,000 MG TOTAL) BY MOUTH AT BEDTIME.   [DISCONTINUED] amLODipine (NORVASC) 10 MG tablet TAKE 1 TABLET BY MOUTH EVERY DAY   No facility-administered encounter medications on file as of 03/19/2021.    Patient Active Problem List   Diagnosis Date Noted   Diabetes mellitus without complication ([place]) 23/53/6144   CAD- mild  disease at cath 2004 11/06/2013   Anal fistula 07/25/2012   Hyperlipidemia    Seroma, postoperative 04/06/2011   COPD (chronic obstructive pulmonary disease) ([place])    Lung mass    Atelectasis    IRRITABLE BOWEL SYNDROME 03/07/2009   HEMORRHAGE OF RECTUM AND ANUS 03/07/2009   OBSTRUCTIVE SLEEP APNEA 08/08/2008   TOBACCO ABUSE 08/06/2008   HYPERCHOLESTEROLEMIA 07/12/2007   HYPERTENSION 07/12/2007   ALLERGIC RHINITIS 07/12/2007   ASTHMA 07/12/2007    DUODENITIS, WITHOUT HEMORRHAGE 07/12/2007    Past Medical History:  Diagnosis Date   Allergic rhinitis    Asthma    Atelectasis    Rt upper lobe   COPD (chronic obstructive pulmonary disease) ([facility])    GERD (gastroesophageal reflux disease)    History of colon polyps 03/14/2009   Tubular adenomatous and Hyperplastic polyps   HTN (hypertension)    Hyperlipidemia    Lung mass    noted on CT scan(12/24/10)    S/P cardiac cath    negative   Tobacco abuse    Treadmill stress test negative for angina pectoris    Dr. Claiborne Billings    Relevant past medical, surgical, family and social history reviewed and updated as indicated. Interim medical history since our last visit reviewed.  Review of Systems Per HPI unless specifically indicated above     Objective:    BP 117/78   Pulse 85   Wt Readings from Last 3 Encounters:  02/10/21 214 lb (97.1 kg)  01/25/20 229 lb (103.9 kg)  07/20/19 235 lb (106.6 kg)    Physical Exam Vitals and nursing note reviewed.  Constitutional:      General: He is not in acute distress.    Appearance: Normal appearance. He is not toxic-appearing.  HENT:     Head: Normocephalic and atraumatic.     Right Ear: External ear normal.     Left Ear: External ear normal.     Nose: Congestion present. No rhinorrhea.     Mouth/Throat:     Mouth: Mucous membranes are moist.     Pharynx: Oropharynx is clear.  Eyes:     General: No scleral icterus.    Extraocular Movements: Extraocular movements intact.  Cardiovascular:     Comments: Unable to assess heart sounds via virtual visit. Pulmonary:     Effort: Pulmonary effort is normal. No respiratory distress.     Comments: Unable to assess lung sounds via virtual visit.  Patient talking in complete sentences during telemedicine visit without accessory muscle use.  Occasional congested sounding cough. Skin:    Coloration: Skin is not jaundiced or pale.     Findings: No erythema.  Neurological:     Mental Status: He  is alert and oriented to person, place, and time.  Psychiatric:        Mood and Affect: Mood normal.        Behavior: Behavior normal.        Thought Content: Thought content normal.        Judgment: Judgment normal.       Assessment & Plan:  1. Viral upper respiratory tract infection Acute x 5 days.  Encouraged viral testing.  Reassured patient that symptoms and exam findings are most consistent with a viral upper respiratory infection and explained lack of efficacy of antibiotics against viruses.  Discussed expected course and features suggestive of secondary bacterial infection.  Continue supportive care. Increase fluid intake with water or electrolyte solution like pedialyte. Encouraged acetaminophen as needed for fever/pain.  Encouraged salt water gargling, chloraseptic spray and throat lozenges. Encouraged OTC guaifenesin. Encouraged saline sinus flushes and/or neti with humidified air.  Notify us early next week if no improvement in symptoms.  In regard to dizziness/unsteadiness, this remains unchanged from previous visit with PCP and I encouraged him to follow up with PCP in regard to this.   Follow up plan: Return for as planned with PCP.  Due to the catastrophic nature of the COVID-19 pandemic, this video visit was completed soley via audio and visual contact via Caregility due to the restrictions of the COVID-19 pandemic.  All issues as above were discussed and addressed. Physical exam was done as above through visual confirmation on Caregility. If it was felt that the patient should be evaluated in the office, they were directed there. The patient verbally consented to this visit. Location of the patient: home Location of the provider: work Those involved with this call:  Provider: Noemi Chapel, DNP, FNP-C CMA: n/a Front Desk/Registration: Vevelyn Pat  Time spent on call:  12 minutes with patient face to face via video conference. More than 50% of this time was spent  in counseling and coordination of care. 15 minutes total spent in review of patient's record and preparation of their chart. I verified patient identity using two factors (patient name and date of birth). Patient consents verbally to being seen via telemedicine visit today.

## 2021-03-21 ENCOUNTER — Other Ambulatory Visit: Payer: Self-pay | Admitting: Family Medicine

## 2021-04-08 DIAGNOSIS — L711 Rhinophyma: Secondary | ICD-10-CM | POA: Diagnosis not present

## 2021-04-08 DIAGNOSIS — L718 Other rosacea: Secondary | ICD-10-CM | POA: Diagnosis not present

## 2021-05-07 ENCOUNTER — Other Ambulatory Visit: Payer: Self-pay | Admitting: Family Medicine

## 2021-05-08 DIAGNOSIS — E119 Type 2 diabetes mellitus without complications: Secondary | ICD-10-CM | POA: Diagnosis not present

## 2021-05-08 DIAGNOSIS — J309 Allergic rhinitis, unspecified: Secondary | ICD-10-CM | POA: Diagnosis not present

## 2021-05-08 DIAGNOSIS — Z9109 Other allergy status, other than to drugs and biological substances: Secondary | ICD-10-CM | POA: Insufficient documentation

## 2021-05-08 DIAGNOSIS — E785 Hyperlipidemia, unspecified: Secondary | ICD-10-CM | POA: Diagnosis not present

## 2021-05-08 DIAGNOSIS — I10 Essential (primary) hypertension: Secondary | ICD-10-CM | POA: Diagnosis not present

## 2021-05-08 DIAGNOSIS — J449 Chronic obstructive pulmonary disease, unspecified: Secondary | ICD-10-CM | POA: Diagnosis not present

## 2021-05-22 DIAGNOSIS — J309 Allergic rhinitis, unspecified: Secondary | ICD-10-CM | POA: Diagnosis not present

## 2021-05-22 DIAGNOSIS — J449 Chronic obstructive pulmonary disease, unspecified: Secondary | ICD-10-CM | POA: Diagnosis not present

## 2021-05-22 DIAGNOSIS — I10 Essential (primary) hypertension: Secondary | ICD-10-CM | POA: Diagnosis not present

## 2021-05-22 DIAGNOSIS — E119 Type 2 diabetes mellitus without complications: Secondary | ICD-10-CM | POA: Diagnosis not present

## 2021-05-22 DIAGNOSIS — E785 Hyperlipidemia, unspecified: Secondary | ICD-10-CM | POA: Diagnosis not present

## 2021-06-24 DIAGNOSIS — E1165 Type 2 diabetes mellitus with hyperglycemia: Secondary | ICD-10-CM | POA: Diagnosis not present

## 2021-06-24 DIAGNOSIS — I10 Essential (primary) hypertension: Secondary | ICD-10-CM | POA: Diagnosis not present

## 2021-06-24 DIAGNOSIS — E785 Hyperlipidemia, unspecified: Secondary | ICD-10-CM | POA: Diagnosis not present

## 2021-07-07 ENCOUNTER — Other Ambulatory Visit: Payer: Medicare PPO

## 2021-07-07 ENCOUNTER — Other Ambulatory Visit: Payer: Self-pay

## 2021-07-07 DIAGNOSIS — R351 Nocturia: Secondary | ICD-10-CM

## 2021-07-07 DIAGNOSIS — E785 Hyperlipidemia, unspecified: Secondary | ICD-10-CM

## 2021-07-07 DIAGNOSIS — Z0000 Encounter for general adult medical examination without abnormal findings: Secondary | ICD-10-CM | POA: Diagnosis not present

## 2021-07-07 DIAGNOSIS — E119 Type 2 diabetes mellitus without complications: Secondary | ICD-10-CM | POA: Diagnosis not present

## 2021-07-08 LAB — CBC WITH DIFFERENTIAL/PLATELET
Absolute Monocytes: 998 cells/uL — ABNORMAL HIGH (ref 200–950)
Basophils Absolute: 63 cells/uL (ref 0–200)
Basophils Relative: 0.6 %
Eosinophils Absolute: 336 cells/uL (ref 15–500)
Eosinophils Relative: 3.2 %
HCT: 44.9 % (ref 38.5–50.0)
Hemoglobin: 15.2 g/dL (ref 13.2–17.1)
Lymphs Abs: 2552 cells/uL (ref 850–3900)
MCH: 31.1 pg (ref 27.0–33.0)
MCHC: 33.9 g/dL (ref 32.0–36.0)
MCV: 92 fL (ref 80.0–100.0)
MPV: 11.4 fL (ref 7.5–12.5)
Monocytes Relative: 9.5 %
Neutro Abs: 6552 cells/uL (ref 1500–7800)
Neutrophils Relative %: 62.4 %
Platelets: 318 10*3/uL (ref 140–400)
RBC: 4.88 10*6/uL (ref 4.20–5.80)
RDW: 12.7 % (ref 11.0–15.0)
Total Lymphocyte: 24.3 %
WBC: 10.5 10*3/uL (ref 3.8–10.8)

## 2021-07-08 LAB — COMPREHENSIVE METABOLIC PANEL
AG Ratio: 1.8 (calc) (ref 1.0–2.5)
ALT: 24 U/L (ref 9–46)
AST: 22 U/L (ref 10–35)
Albumin: 4.4 g/dL (ref 3.6–5.1)
Alkaline phosphatase (APISO): 53 U/L (ref 35–144)
BUN: 14 mg/dL (ref 7–25)
CO2: 29 mmol/L (ref 20–32)
Calcium: 9.8 mg/dL (ref 8.6–10.3)
Chloride: 106 mmol/L (ref 98–110)
Creat: 0.83 mg/dL (ref 0.70–1.28)
Globulin: 2.4 g/dL (calc) (ref 1.9–3.7)
Glucose, Bld: 111 mg/dL — ABNORMAL HIGH (ref 65–99)
Potassium: 5 mmol/L (ref 3.5–5.3)
Sodium: 142 mmol/L (ref 135–146)
Total Bilirubin: 0.4 mg/dL (ref 0.2–1.2)
Total Protein: 6.8 g/dL (ref 6.1–8.1)

## 2021-07-08 LAB — LIPID PANEL
Cholesterol: 114 mg/dL (ref <200)
HDL: 36 mg/dL — ABNORMAL LOW (ref >=40)
LDL Cholesterol (Calc): 59 mg/dL (calc)
Non-HDL Cholesterol (Calc): 78 mg/dL (calc) (ref <130)
Total CHOL/HDL Ratio: 3.2 (calc) (ref <5.0)
Triglycerides: 104 mg/dL (ref <150)

## 2021-07-08 LAB — HEMOGLOBIN A1C
Hgb A1c MFr Bld: 6.3 % of total Hgb — ABNORMAL HIGH (ref <5.7)
Mean Plasma Glucose: 134 mg/dL
eAG (mmol/L): 7.4 mmol/L

## 2021-07-08 LAB — PSA: PSA: 0.2 ng/mL (ref <=4.00)

## 2021-07-11 ENCOUNTER — Ambulatory Visit (INDEPENDENT_AMBULATORY_CARE_PROVIDER_SITE_OTHER): Payer: Medicare PPO | Admitting: Family Medicine

## 2021-07-11 ENCOUNTER — Encounter: Payer: Self-pay | Admitting: Family Medicine

## 2021-07-11 ENCOUNTER — Other Ambulatory Visit: Payer: Self-pay

## 2021-07-11 VITALS — BP 124/78 | HR 88 | Temp 97.3°F | Resp 18 | Ht 72.0 in | Wt 199.0 lb

## 2021-07-11 DIAGNOSIS — Z0001 Encounter for general adult medical examination with abnormal findings: Secondary | ICD-10-CM | POA: Diagnosis not present

## 2021-07-11 DIAGNOSIS — J449 Chronic obstructive pulmonary disease, unspecified: Secondary | ICD-10-CM

## 2021-07-11 DIAGNOSIS — R7303 Prediabetes: Secondary | ICD-10-CM | POA: Diagnosis not present

## 2021-07-11 DIAGNOSIS — E119 Type 2 diabetes mellitus without complications: Secondary | ICD-10-CM

## 2021-07-11 DIAGNOSIS — Z122 Encounter for screening for malignant neoplasm of respiratory organs: Secondary | ICD-10-CM

## 2021-07-11 DIAGNOSIS — I2510 Atherosclerotic heart disease of native coronary artery without angina pectoris: Secondary | ICD-10-CM | POA: Diagnosis not present

## 2021-07-11 DIAGNOSIS — E7800 Pure hypercholesterolemia, unspecified: Secondary | ICD-10-CM | POA: Diagnosis not present

## 2021-07-11 DIAGNOSIS — Z0000 Encounter for general adult medical examination without abnormal findings: Secondary | ICD-10-CM

## 2021-07-11 MED ORDER — FLUTICASONE PROPIONATE 50 MCG/ACT NA SUSP: NASAL | 3 refills | Status: DC

## 2021-07-11 MED ORDER — LEVOCETIRIZINE DIHYDROCHLORIDE 5 MG PO TABS: 5.0000 mg | ORAL_TABLET | Freq: Every evening | ORAL | 11 refills | Status: DC

## 2021-07-11 NOTE — Progress Notes (Signed)
? ?Subjective:  ? ? Patient ID: Vincent Short, male    DOB: 25-Feb-1951, 71 y.o.   MRN: [id number] ? ?HPI ?Patient is a very pleasant 71 year old Caucasian gentleman here today for complete physical exam.  When I last saw him, his hemoglobin A1c was 7.8.  He started taking metformin.  He also drastically changed his diet.  He has been exercising regularly.  He has lost weight.  He is riding his bicycle 3 times a week for 20 miles.  I am extremely proud of him for this.  He is also quit smoking.  As result, his A1c has fallen to 6.3.  He seems to be doing extremely well.  He is due for a tetanus shot.  He is due for shingles vaccine.  Despite quitting smoking he is still due for lung cancer screening.  He is also overdue for colonoscopy.  His most recent lab work is listed below. ?Immunization History  ?Administered Date(s) Administered  ? Fluad Quad(high Dose 65+) 01/04/2019, 01/25/2020  ? Influenza, High Dose Seasonal PF 02/25/2017, 03/04/2018  ? Influenza,inj,Quad PF,6+ Mos 03/21/2013, 04/17/2014  ? Influenza-Unspecified 02/15/2003, 03/01/2004  ? PFIZER(Purple Top)SARS-COV-2 Vaccination 05/22/2019, 06/22/2019, 04/14/2020  ? Pneumococcal Conjugate-13 04/17/2014  ? Pneumococcal Polysaccharide-23 03/21/2013, 01/25/2020  ? Td 06/27/2002  ? ?Lab on 07/07/2021  ?Component Date Value Ref Range Status  ? Cholesterol 07/07/2021 114  <200 mg/dL Final  ? HDL 07/07/2021 36 (L)  > OR = 40 mg/dL Final  ? Triglycerides 07/07/2021 104  <150 mg/dL Final  ? LDL Cholesterol (Calc) 07/07/2021 59  mg/dL (calc) Final  ? Comment: Reference range: <100 ?Marland Kitchen ?Desirable range <100 mg/dL for primary prevention;   ?<70 mg/dL for patients with CHD or diabetic patients  ?with > or = 2 CHD risk factors. ?. ?LDL-C is now calculated using the Martin-Hopkins  ?calculation, which is a validated novel method providing  ?better accuracy than the Friedewald equation in the  ?estimation of LDL-C.  ?Cresenciano Genre et al. Annamaria Helling. 7829;562(13): 2061-2068   ?(http://education.[facility].com/faq/FAQ164) ?  ? Total CHOL/HDL Ratio 07/07/2021 3.2  <5.0 (calc) Final  ? Non-HDL Cholesterol (Calc) 07/07/2021 78  <130 mg/dL (calc) Final  ? Comment: For patients with diabetes plus 1 major ASCVD risk  ?factor, treating to a non-HDL-C goal of <100 mg/dL  ?(LDL-C of <70 mg/dL) is considered a therapeutic  ?option. ?  ? PSA 07/07/2021 0.20  < OR = 4.00 ng/mL Final  ? Comment: The total PSA value from this assay system is  ?standardized against the WHO standard. The test  ?result will be approximately 20% lower when compared  ?to the equimolar-standardized total PSA (Beckman  ?Coulter). Comparison of serial PSA results should be  ?interpreted with this fact in mind. ?. ?This test was performed using the Siemens  ?chemiluminescent method. Values obtained from  ?different assay methods cannot be used ?interchangeably. PSA levels, regardless of ?value, should not be interpreted as absolute ?evidence of the presence or absence of disease. ?  ? Glucose, Bld 07/07/2021 111 (H)  65 - 99 mg/dL Final  ? Comment: . ?           Fasting reference interval ?. ?For someone without known diabetes, a glucose value ?between 100 and 125 mg/dL is consistent with ?prediabetes and should be confirmed with a ?follow-up test. ?. ?  ? BUN 07/07/2021 14  7 - 25 mg/dL Final  ? Creat 07/07/2021 0.83  0.70 - 1.28 mg/dL Final  ? BUN/Creatinine Ratio 08/65/7846 NOT APPLICABLE  6 - 22 (calc) Final  ? Sodium 07/07/2021 142  135 - 146 mmol/L Final  ? Potassium 07/07/2021 5.0  3.5 - 5.3 mmol/L Final  ? Chloride 07/07/2021 106  98 - 110 mmol/L Final  ? CO2 07/07/2021 29  20 - 32 mmol/L Final  ? Calcium 07/07/2021 9.8  8.6 - 10.3 mg/dL Final  ? Total Protein 07/07/2021 6.8  6.1 - 8.1 g/dL Final  ? Albumin 07/07/2021 4.4  3.6 - 5.1 g/dL Final  ? Globulin 07/07/2021 2.4  1.9 - 3.7 g/dL (calc) Final  ? AG Ratio 07/07/2021 1.8  1.0 - 2.5 (calc) Final  ? Total Bilirubin 07/07/2021 0.4  0.2 - 1.2 mg/dL Final  ?  Alkaline phosphatase (APISO) 07/07/2021 53  35 - 144 U/L Final  ? AST 07/07/2021 22  10 - 35 U/L Final  ? ALT 07/07/2021 24  9 - 46 U/L Final  ? WBC 07/07/2021 10.5  3.8 - 10.8 Thousand/uL Final  ? RBC 07/07/2021 4.88  4.20 - 5.80 Million/uL Final  ? Hemoglobin 07/07/2021 15.2  13.2 - 17.1 g/dL Final  ? HCT 07/07/2021 44.9  38.5 - 50.0 % Final  ? MCV 07/07/2021 92.0  80.0 - 100.0 fL Final  ? MCH 07/07/2021 31.1  27.0 - 33.0 pg Final  ? MCHC 07/07/2021 33.9  32.0 - 36.0 g/dL Final  ? RDW 07/07/2021 12.7  11.0 - 15.0 % Final  ? Platelets 07/07/2021 318  140 - 400 Thousand/uL Final  ? MPV 07/07/2021 11.4  7.5 - 12.5 fL Final  ? Neutro Abs 07/07/2021 6,552  1,500 - 7,800 cells/uL Final  ? Lymphs Abs 07/07/2021 2,552  850 - 3,900 cells/uL Final  ? Absolute Monocytes 07/07/2021 998 (H)  200 - 950 cells/uL Final  ? Eosinophils Absolute 07/07/2021 336  15 - 500 cells/uL Final  ? Basophils Absolute 07/07/2021 63  0 - 200 cells/uL Final  ? Neutrophils Relative % 07/07/2021 62.4  % Final  ? Total Lymphocyte 07/07/2021 24.3  % Final  ? Monocytes Relative 07/07/2021 9.5  % Final  ? Eosinophils Relative 07/07/2021 3.2  % Final  ? Basophils Relative 07/07/2021 0.6  % Final  ? Hgb A1c MFr Bld 07/07/2021 6.3 (H)  <5.7 % of total Hgb Final  ? Comment: For someone without known diabetes, a hemoglobin  ?A1c value between 5.7% and 6.4% is consistent with ?prediabetes and should be confirmed with a  ?follow-up test. ?. ?For someone with known diabetes, a value <7% ?indicates that their diabetes is well controlled. A1c ?targets should be individualized based on duration of ?diabetes, age, comorbid conditions, and other ?considerations. ?. ?This assay result is consistent with an increased risk ?of diabetes. ?. ?Currently, no consensus exists regarding use of ?hemoglobin A1c for diagnosis of diabetes for children. ?. ?  ? Mean Plasma Glucose 07/07/2021 134  mg/dL Final  ? eAG (mmol/L) 07/07/2021 7.4  mmol/L Final  ? ? ?Past Medical  History:  ?Diagnosis Date  ? Allergic rhinitis   ? Asthma   ? Atelectasis   ? Rt upper lobe  ? COPD (chronic obstructive pulmonary disease) ([place])   ? GERD (gastroesophageal reflux disease)   ? History of colon polyps 03/14/2009  ? Tubular adenomatous and Hyperplastic polyps  ? HTN (hypertension)   ? Hyperlipidemia   ? Lung mass   ? noted on CT scan(12/24/10)   ? S/P cardiac cath   ? negative  ? Tobacco abuse   ? Treadmill stress test negative for angina  pectoris   ? Dr. Claiborne Billings  ? ?Past Surgical History:  ?Procedure Laterality Date  ? Arthroscopy left wrist with radial styloidectomy.  07/06/2000  ? Dr Fredna Dow  ? Bronchoscopy with airway inspection, brush cytology,  11/22/2006  ? Dr Halford Chessman  ? CARDIAC CATHETERIZATION  07/05/2002  ? Dr Horton Marshall LVF, SMOOTH 10% TO 20% OSTIAL AND 10%-20% LUMINAL IRREGULARITY OF THE PROXIMAL LAD, SMOOTH 30%-40% PROXIMAL RCA,WHICH IMPROVED FOLLOWING INTRACORONARY NTG ADMINISTRATION SUGGESTING CORONARY VASOSPASM. NO AORTOILIAC DISEASE.  ? DOPPLER ECHOCARDIOGRAPHY N/A 06/22/08  ? LV SIZE IS NORMAL. LV FUNCTION IS NORMAL. EF=>55%. THERE IS BORDERLINE CONCENTRIC LVH.  ? FRACTURE SURGERY    ? Left wrist scaphoid fx  ? FRACTURE SURGERY    ? Left ankle surgery  ? Left ankle surgery  11/11/2005  ? Dr Noemi Chapel  ? LUNG SURGERY  01/21/11   Dr. Cyndia Bent  ? FOB, R THORACOTOMY WITH RIGHT UPPER LOBECTOMY  ? LUNG SURGERY  01/21/11  ? FOB, R THORACOTOMY WITH RIGHT UPPER LOBECTOMY  ? NASAL SINUS SURGERY  1990  ? NUCLEAR STRESS TEST N/A 09/09/10  ? NORMAL PATTERN OF PERFUSION IN ALL REGIONS. EF IS 63%. NO WALL MOTION ABNORMALITIES.  ? ROTATOR CUFF REPAIR  pending  ? Dr. Noemi Chapel  ? TONSILLECTOMY    ? ?Current Outpatient Medications on File Prior to Visit  ?Medication Sig Dispense Refill  ? albuterol (PROVENTIL HFA;VENTOLIN HFA) 108 (90 Base) MCG/ACT inhaler Inhale 2 puffs into the lungs every 6 (six) hours as needed for wheezing or shortness of breath. 1 Inhaler 5  ? albuterol (PROVENTIL) (2.5 MG/3ML) 0.083% nebulizer  solution Take 3 mLs (2.5 mg total) by nebulization every 6 (six) hours as needed for wheezing or shortness of breath. 150 mL 1  ? ALPRAZolam (XANAX) 0.5 MG tablet Take 1 tablet (0.5 mg total) by mouth 3 (three) times daily a

## 2021-07-16 DIAGNOSIS — E1165 Type 2 diabetes mellitus with hyperglycemia: Secondary | ICD-10-CM | POA: Diagnosis not present

## 2021-07-16 DIAGNOSIS — I10 Essential (primary) hypertension: Secondary | ICD-10-CM | POA: Diagnosis not present

## 2021-07-16 DIAGNOSIS — E785 Hyperlipidemia, unspecified: Secondary | ICD-10-CM | POA: Diagnosis not present

## 2021-07-23 DIAGNOSIS — M25511 Pain in right shoulder: Secondary | ICD-10-CM | POA: Diagnosis not present

## 2021-07-25 DIAGNOSIS — M75121 Complete rotator cuff tear or rupture of right shoulder, not specified as traumatic: Secondary | ICD-10-CM | POA: Diagnosis not present

## 2021-07-25 DIAGNOSIS — E785 Hyperlipidemia, unspecified: Secondary | ICD-10-CM | POA: Diagnosis not present

## 2021-07-25 DIAGNOSIS — E1165 Type 2 diabetes mellitus with hyperglycemia: Secondary | ICD-10-CM | POA: Diagnosis not present

## 2021-07-25 DIAGNOSIS — I10 Essential (primary) hypertension: Secondary | ICD-10-CM | POA: Diagnosis not present

## 2021-08-05 ENCOUNTER — Ambulatory Visit
Admission: RE | Admit: 2021-08-05 | Discharge: 2021-08-05 | Disposition: A | Discharge status: Home / Self Care | Payer: Medicare PPO | Source: Ambulatory Visit | Attending: Family Medicine | Admitting: Family Medicine

## 2021-08-05 DIAGNOSIS — Z122 Encounter for screening for malignant neoplasm of respiratory organs: Secondary | ICD-10-CM

## 2021-08-05 DIAGNOSIS — M75101 Unspecified rotator cuff tear or rupture of right shoulder, not specified as traumatic: Secondary | ICD-10-CM | POA: Diagnosis not present

## 2021-08-05 DIAGNOSIS — M25511 Pain in right shoulder: Secondary | ICD-10-CM | POA: Insufficient documentation

## 2021-08-05 DIAGNOSIS — Z87891 Personal history of nicotine dependence: Secondary | ICD-10-CM | POA: Diagnosis not present

## 2021-08-07 ENCOUNTER — Other Ambulatory Visit: Payer: Self-pay | Admitting: Family Medicine

## 2021-08-07 DIAGNOSIS — R911 Solitary pulmonary nodule: Secondary | ICD-10-CM

## 2021-08-12 ENCOUNTER — Other Ambulatory Visit: Payer: Self-pay | Admitting: Family Medicine

## 2021-08-12 DIAGNOSIS — R911 Solitary pulmonary nodule: Secondary | ICD-10-CM

## 2021-08-15 DIAGNOSIS — I10 Essential (primary) hypertension: Secondary | ICD-10-CM | POA: Diagnosis not present

## 2021-08-15 DIAGNOSIS — E1165 Type 2 diabetes mellitus with hyperglycemia: Secondary | ICD-10-CM | POA: Diagnosis not present

## 2021-08-15 DIAGNOSIS — E785 Hyperlipidemia, unspecified: Secondary | ICD-10-CM | POA: Diagnosis not present

## 2021-08-24 DIAGNOSIS — E785 Hyperlipidemia, unspecified: Secondary | ICD-10-CM | POA: Diagnosis not present

## 2021-08-24 DIAGNOSIS — E1165 Type 2 diabetes mellitus with hyperglycemia: Secondary | ICD-10-CM | POA: Diagnosis not present

## 2021-08-24 DIAGNOSIS — I10 Essential (primary) hypertension: Secondary | ICD-10-CM | POA: Diagnosis not present

## 2021-08-28 DIAGNOSIS — M24111 Other articular cartilage disorders, right shoulder: Secondary | ICD-10-CM | POA: Diagnosis not present

## 2021-08-28 DIAGNOSIS — I10 Essential (primary) hypertension: Secondary | ICD-10-CM | POA: Diagnosis not present

## 2021-08-28 DIAGNOSIS — M75121 Complete rotator cuff tear or rupture of right shoulder, not specified as traumatic: Secondary | ICD-10-CM | POA: Diagnosis not present

## 2021-08-28 DIAGNOSIS — M65811 Other synovitis and tenosynovitis, right shoulder: Secondary | ICD-10-CM | POA: Diagnosis not present

## 2021-08-28 DIAGNOSIS — G8918 Other acute postprocedural pain: Secondary | ICD-10-CM | POA: Diagnosis not present

## 2021-08-28 DIAGNOSIS — M25511 Pain in right shoulder: Secondary | ICD-10-CM | POA: Diagnosis not present

## 2021-08-28 DIAGNOSIS — S46111A Strain of muscle, fascia and tendon of long head of biceps, right arm, initial encounter: Secondary | ICD-10-CM | POA: Diagnosis not present

## 2021-08-28 DIAGNOSIS — Z4889 Encounter for other specified surgical aftercare: Secondary | ICD-10-CM | POA: Diagnosis not present

## 2021-08-28 DIAGNOSIS — I890 Lymphedema, not elsewhere classified: Secondary | ICD-10-CM | POA: Diagnosis not present

## 2021-08-28 DIAGNOSIS — M7541 Impingement syndrome of right shoulder: Secondary | ICD-10-CM | POA: Diagnosis not present

## 2021-08-28 DIAGNOSIS — G4730 Sleep apnea, unspecified: Secondary | ICD-10-CM | POA: Diagnosis not present

## 2021-08-28 DIAGNOSIS — M75101 Unspecified rotator cuff tear or rupture of right shoulder, not specified as traumatic: Secondary | ICD-10-CM | POA: Diagnosis not present

## 2021-08-28 DIAGNOSIS — M75111 Incomplete rotator cuff tear or rupture of right shoulder, not specified as traumatic: Secondary | ICD-10-CM | POA: Diagnosis not present

## 2021-08-28 DIAGNOSIS — Z7984 Long term (current) use of oral hypoglycemic drugs: Secondary | ICD-10-CM | POA: Diagnosis not present

## 2021-08-28 DIAGNOSIS — M19011 Primary osteoarthritis, right shoulder: Secondary | ICD-10-CM | POA: Diagnosis not present

## 2021-08-28 DIAGNOSIS — Z01810 Encounter for preprocedural cardiovascular examination: Secondary | ICD-10-CM | POA: Diagnosis not present

## 2021-08-28 DIAGNOSIS — E119 Type 2 diabetes mellitus without complications: Secondary | ICD-10-CM | POA: Diagnosis not present

## 2021-08-28 DIAGNOSIS — M94211 Chondromalacia, right shoulder: Secondary | ICD-10-CM | POA: Diagnosis not present

## 2021-09-14 DIAGNOSIS — E1165 Type 2 diabetes mellitus with hyperglycemia: Secondary | ICD-10-CM | POA: Diagnosis not present

## 2021-09-14 DIAGNOSIS — I10 Essential (primary) hypertension: Secondary | ICD-10-CM | POA: Diagnosis not present

## 2021-09-14 DIAGNOSIS — E785 Hyperlipidemia, unspecified: Secondary | ICD-10-CM | POA: Diagnosis not present

## 2021-09-16 DIAGNOSIS — M25511 Pain in right shoulder: Secondary | ICD-10-CM | POA: Diagnosis not present

## 2021-09-16 DIAGNOSIS — Z9889 Other specified postprocedural states: Secondary | ICD-10-CM | POA: Diagnosis not present

## 2021-09-18 DIAGNOSIS — M25511 Pain in right shoulder: Secondary | ICD-10-CM | POA: Diagnosis not present

## 2021-09-18 DIAGNOSIS — Z9889 Other specified postprocedural states: Secondary | ICD-10-CM | POA: Diagnosis not present

## 2021-09-21 ENCOUNTER — Other Ambulatory Visit: Payer: Self-pay | Admitting: Family Medicine

## 2021-09-23 NOTE — Telephone Encounter (Signed)
Requested Prescriptions  Pending Prescriptions Disp Refills  . metoprolol succinate (TOPROL-XL) 25 MG 24 hr tablet [Pharmacy Med Name: METOPROLOL SUCC ER 25 MG TAB] 90 tablet 1    Sig: TAKE 1 TABLET BY MOUTH EVERY DAY     Cardiovascular:  Beta Blockers Failed - 09/21/2021 10:26 AM      Failed - Valid encounter within last 6 months    Recent Outpatient Visits          2 months ago General medical exam   [place] Susy Frizzle, MD   6 months ago Viral upper respiratory tract infection   [place] Eulogio Bear, NP   7 months ago Fatigue, unspecified type   [place] Pickard, Cammie Mcgee, MD   1 year ago Screen for colon cancer   [place] Susy Frizzle, MD   1 year ago Encounter for screening for lung cancer   [place] Pickard, Cammie Mcgee, MD             Passed - Last BP in normal range    BP Readings from Last 1 Encounters:  07/11/21 124/78         Passed - Last Heart Rate in normal range    Pulse Readings from Last 1 Encounters:  07/11/21 88

## 2021-09-24 DIAGNOSIS — E785 Hyperlipidemia, unspecified: Secondary | ICD-10-CM | POA: Diagnosis not present

## 2021-09-24 DIAGNOSIS — Z9889 Other specified postprocedural states: Secondary | ICD-10-CM | POA: Diagnosis not present

## 2021-09-24 DIAGNOSIS — M25511 Pain in right shoulder: Secondary | ICD-10-CM | POA: Diagnosis not present

## 2021-09-24 DIAGNOSIS — I10 Essential (primary) hypertension: Secondary | ICD-10-CM | POA: Diagnosis not present

## 2021-09-24 DIAGNOSIS — E1165 Type 2 diabetes mellitus with hyperglycemia: Secondary | ICD-10-CM | POA: Diagnosis not present

## 2021-09-26 DIAGNOSIS — M25511 Pain in right shoulder: Secondary | ICD-10-CM | POA: Diagnosis not present

## 2021-09-26 DIAGNOSIS — Z9889 Other specified postprocedural states: Secondary | ICD-10-CM | POA: Diagnosis not present

## 2021-09-30 DIAGNOSIS — Z9889 Other specified postprocedural states: Secondary | ICD-10-CM | POA: Diagnosis not present

## 2021-09-30 DIAGNOSIS — M25511 Pain in right shoulder: Secondary | ICD-10-CM | POA: Diagnosis not present

## 2021-10-02 DIAGNOSIS — Z9889 Other specified postprocedural states: Secondary | ICD-10-CM | POA: Diagnosis not present

## 2021-10-02 DIAGNOSIS — M25511 Pain in right shoulder: Secondary | ICD-10-CM | POA: Diagnosis not present

## 2021-10-07 DIAGNOSIS — Z9889 Other specified postprocedural states: Secondary | ICD-10-CM | POA: Diagnosis not present

## 2021-10-07 DIAGNOSIS — M25511 Pain in right shoulder: Secondary | ICD-10-CM | POA: Diagnosis not present

## 2021-10-09 DIAGNOSIS — M25511 Pain in right shoulder: Secondary | ICD-10-CM | POA: Diagnosis not present

## 2021-10-09 DIAGNOSIS — Z9889 Other specified postprocedural states: Secondary | ICD-10-CM | POA: Diagnosis not present

## 2021-10-11 ENCOUNTER — Other Ambulatory Visit: Payer: Self-pay | Admitting: Family Medicine

## 2021-10-13 NOTE — Telephone Encounter (Signed)
Requested Prescriptions  Pending Prescriptions Disp Refills  . metFORMIN (GLUCOPHAGE-XR) 500 MG 24 hr tablet [Pharmacy Med Name: METFORMIN HCL ER 500 MG TABLET] 180 tablet 0    Sig: TAKE 2 TABLETS BY MOUTH [place]     Endocrinology:  Diabetes - Biguanides Failed - 10/13/2021 12:10 PM      Failed - Valid encounter within last 6 months    Recent Outpatient Visits          3 months ago General medical exam   [place] Susy Frizzle, MD   6 months ago Viral upper respiratory tract infection   [place] Eulogio Bear, NP   8 months ago Fatigue, unspecified type   [place] Susy Frizzle, MD   1 year ago Screen for colon cancer   [place] Susy Frizzle, MD   1 year ago Encounter for screening for lung cancer   [place] Pickard, Cammie Mcgee, MD             Passed - Cr in normal range and within 360 days    Creat  Date Value Ref Range Status  07/07/2021 0.83 0.70 - 1.28 mg/dL Final         Passed - HBA1C is between 0 and 7.9 and within 180 days    Hgb A1c MFr Bld  Date Value Ref Range Status  07/07/2021 6.3 (H) <5.7 % of total Hgb Final    Comment:    For someone without known diabetes, a hemoglobin  A1c value between 5.7% and 6.4% is consistent with prediabetes and should be confirmed with a  follow-up test. . For someone with known diabetes, a value <7% indicates that their diabetes is well controlled. A1c targets should be individualized based on duration of diabetes, age, comorbid conditions, and other considerations. . This assay result is consistent with an increased risk of diabetes. . Currently, no consensus exists regarding use of hemoglobin A1c for diagnosis of diabetes for children. .          Passed - eGFR in normal range and within 360 days    GFR, Est African American  Date Value Ref Range Status  01/25/2020 106 > OR = 60  mL/min/1.77m2 Final   GFR, Est Non African American  Date Value Ref Range Status  01/25/2020 91 > OR = 60 mL/min/1.66m2 Final   eGFR  Date Value Ref Range Status  02/10/2021 95 > OR = 60 mL/min/1.97m2 Final    Comment:    The eGFR is based on the CKD-EPI 2021 equation. To calculate  the new eGFR from a previous Creatinine or Cystatin C result, go to https://www.kidney.org/professionals/ kdoqi/gfr%5Fcalculator          Passed - B12 Level in normal range and within 720 days    Vitamin B-12  Date Value Ref Range Status  02/10/2021 666 200 - 1,100 pg/mL Final         Passed - CBC within normal limits and completed in the last 12 months    WBC  Date Value Ref Range Status  07/07/2021 10.5 3.8 - 10.8 Thousand/uL Final   RBC  Date Value Ref Range Status  07/07/2021 4.88 4.20 - 5.80 Million/uL Final   Hemoglobin  Date Value Ref Range Status  07/07/2021 15.2 13.2 - 17.1 g/dL Final   HCT  Date Value Ref Range Status  07/07/2021 44.9 38.5 - 50.0 % Final  MCHC  Date Value Ref Range Status  07/07/2021 33.9 32.0 - 36.0 g/dL Final   [facility]  Date Value Ref Range Status  07/07/2021 31.1 27.0 - 33.0 pg Final   MCV  Date Value Ref Range Status  07/07/2021 92.0 80.0 - 100.0 fL Final   No results found for: "PLTCOUNTKUC", "LABPLAT", "POCPLA" RDW  Date Value Ref Range Status  07/07/2021 12.7 11.0 - 15.0 % Final

## 2021-10-14 DIAGNOSIS — E1165 Type 2 diabetes mellitus with hyperglycemia: Secondary | ICD-10-CM | POA: Diagnosis not present

## 2021-10-14 DIAGNOSIS — Z9889 Other specified postprocedural states: Secondary | ICD-10-CM | POA: Diagnosis not present

## 2021-10-14 DIAGNOSIS — I10 Essential (primary) hypertension: Secondary | ICD-10-CM | POA: Diagnosis not present

## 2021-10-14 DIAGNOSIS — M25511 Pain in right shoulder: Secondary | ICD-10-CM | POA: Diagnosis not present

## 2021-10-14 DIAGNOSIS — E785 Hyperlipidemia, unspecified: Secondary | ICD-10-CM | POA: Diagnosis not present

## 2021-10-16 DIAGNOSIS — Z9889 Other specified postprocedural states: Secondary | ICD-10-CM | POA: Diagnosis not present

## 2021-10-16 DIAGNOSIS — M25511 Pain in right shoulder: Secondary | ICD-10-CM | POA: Diagnosis not present

## 2021-10-21 DIAGNOSIS — Z9889 Other specified postprocedural states: Secondary | ICD-10-CM | POA: Diagnosis not present

## 2021-10-21 DIAGNOSIS — M25511 Pain in right shoulder: Secondary | ICD-10-CM | POA: Diagnosis not present

## 2021-10-24 DIAGNOSIS — E785 Hyperlipidemia, unspecified: Secondary | ICD-10-CM | POA: Diagnosis not present

## 2021-10-24 DIAGNOSIS — E1165 Type 2 diabetes mellitus with hyperglycemia: Secondary | ICD-10-CM | POA: Diagnosis not present

## 2021-10-24 DIAGNOSIS — I10 Essential (primary) hypertension: Secondary | ICD-10-CM | POA: Diagnosis not present

## 2021-10-27 ENCOUNTER — Other Ambulatory Visit: Payer: Self-pay

## 2021-10-27 DIAGNOSIS — J329 Chronic sinusitis, unspecified: Secondary | ICD-10-CM

## 2021-10-27 DIAGNOSIS — Z9889 Other specified postprocedural states: Secondary | ICD-10-CM | POA: Diagnosis not present

## 2021-10-27 DIAGNOSIS — M25511 Pain in right shoulder: Secondary | ICD-10-CM | POA: Diagnosis not present

## 2021-10-27 NOTE — Telephone Encounter (Signed)
Pharmacy faxed a refill request for montelukast (SINGULAIR) 10 MG tablet [[id number]]    Order Details Dose, Route, Frequency: As Directed  Dispense Quantity: 90 tablet Refills: 3        Sig: TAKE 1 TABLET BY MOUTH EVERYDAY AT BEDTIME       Start Date: 11/27/20 End Date: --  Written Date: 11/27/20 Expiration Date: 11/27/21

## 2021-10-28 ENCOUNTER — Other Ambulatory Visit: Payer: Self-pay | Admitting: Family Medicine

## 2021-10-29 MED ORDER — MONTELUKAST SODIUM 10 MG PO TABS: ORAL_TABLET | ORAL | 1 refills | Status: DC

## 2021-10-29 NOTE — Telephone Encounter (Signed)
Requested Prescriptions  Pending Prescriptions Disp Refills  . montelukast (SINGULAIR) 10 MG tablet 90 tablet 1     Pulmonology:  Leukotriene Inhibitors Passed - 10/27/2021  1:41 PM      Passed - Valid encounter within last 12 months    Recent Outpatient Visits          3 months ago General medical exam   [place] Susy Frizzle, MD   7 months ago Viral upper respiratory tract infection   [place] Eulogio Bear, NP   8 months ago Fatigue, unspecified type   [place], [place] T, MD   1 year ago Screen for colon cancer   [place] Susy Frizzle, MD   1 year ago Encounter for screening for lung cancer   [place] Pickard, Cammie Mcgee, MD

## 2021-10-30 DIAGNOSIS — Z9889 Other specified postprocedural states: Secondary | ICD-10-CM | POA: Diagnosis not present

## 2021-10-30 DIAGNOSIS — M25511 Pain in right shoulder: Secondary | ICD-10-CM | POA: Diagnosis not present

## 2021-11-04 DIAGNOSIS — M25511 Pain in right shoulder: Secondary | ICD-10-CM | POA: Diagnosis not present

## 2021-11-04 DIAGNOSIS — Z9889 Other specified postprocedural states: Secondary | ICD-10-CM | POA: Diagnosis not present

## 2021-11-06 DIAGNOSIS — M25511 Pain in right shoulder: Secondary | ICD-10-CM | POA: Diagnosis not present

## 2021-11-06 DIAGNOSIS — Z9889 Other specified postprocedural states: Secondary | ICD-10-CM | POA: Diagnosis not present

## 2021-11-13 DIAGNOSIS — I10 Essential (primary) hypertension: Secondary | ICD-10-CM | POA: Diagnosis not present

## 2021-11-13 DIAGNOSIS — E785 Hyperlipidemia, unspecified: Secondary | ICD-10-CM | POA: Diagnosis not present

## 2021-11-13 DIAGNOSIS — E1165 Type 2 diabetes mellitus with hyperglycemia: Secondary | ICD-10-CM | POA: Diagnosis not present

## 2021-11-24 DIAGNOSIS — E785 Hyperlipidemia, unspecified: Secondary | ICD-10-CM | POA: Diagnosis not present

## 2021-11-24 DIAGNOSIS — I10 Essential (primary) hypertension: Secondary | ICD-10-CM | POA: Diagnosis not present

## 2021-11-24 DIAGNOSIS — E1165 Type 2 diabetes mellitus with hyperglycemia: Secondary | ICD-10-CM | POA: Diagnosis not present

## 2021-12-13 DIAGNOSIS — E1165 Type 2 diabetes mellitus with hyperglycemia: Secondary | ICD-10-CM | POA: Diagnosis not present

## 2021-12-13 DIAGNOSIS — E785 Hyperlipidemia, unspecified: Secondary | ICD-10-CM | POA: Diagnosis not present

## 2021-12-13 DIAGNOSIS — I10 Essential (primary) hypertension: Secondary | ICD-10-CM | POA: Diagnosis not present

## 2021-12-25 DIAGNOSIS — E1165 Type 2 diabetes mellitus with hyperglycemia: Secondary | ICD-10-CM | POA: Diagnosis not present

## 2021-12-25 DIAGNOSIS — I10 Essential (primary) hypertension: Secondary | ICD-10-CM | POA: Diagnosis not present

## 2022-01-05 ENCOUNTER — Other Ambulatory Visit: Payer: Self-pay | Admitting: Family Medicine

## 2022-01-08 DIAGNOSIS — Z9889 Other specified postprocedural states: Secondary | ICD-10-CM | POA: Diagnosis not present

## 2022-01-12 DIAGNOSIS — E1165 Type 2 diabetes mellitus with hyperglycemia: Secondary | ICD-10-CM | POA: Diagnosis not present

## 2022-01-12 DIAGNOSIS — I10 Essential (primary) hypertension: Secondary | ICD-10-CM | POA: Diagnosis not present

## 2022-01-15 ENCOUNTER — Other Ambulatory Visit: Payer: Self-pay

## 2022-01-15 ENCOUNTER — Telehealth: Payer: Self-pay

## 2022-01-15 MED ORDER — ATORVASTATIN CALCIUM 80 MG PO TABS: 80.0000 mg | ORAL_TABLET | Freq: Every day | ORAL | 3 refills | Status: DC

## 2022-01-15 NOTE — Addendum Note (Signed)
Addended by: Colman Cater on: 01/15/2022 02:45 PM   Modules accepted: Orders

## 2022-01-15 NOTE — Telephone Encounter (Signed)
Pharmacy faxed a refill request for atorvastatin (LIPITOR) 80 MG tablet [[id number]]    Order Details Dose, Route, Frequency: As Directed  Dispense Quantity: 90 tablet Refills: 3        Sig: TAKE 1 TABLET BY MOUTH EVERY DAY       Start Date: 12/26/20 End Date: --  Written Date: 12/26/20 Expiration Date: 12/26/21  Original Order:  atorvastatin (LIPITOR) 80 MG tablet [[id number]]

## 2022-01-15 NOTE — Telephone Encounter (Signed)
Pharmacy faxed a refill request for  metFORMIN (GLUCOPHAGE-XR) 500 MG 24 hr tablet [[id number]]    Order Details Dose, Route, Frequency: As Directed  Dispense Quantity: 180 tablet Refills: 0        Sig: TAKE 2 TABLETS BY MOUTH EVERY DAY WITH BREAKFAST       Start Date: 10/13/21 End Date: --  Written Date: 10/13/21 Expiration Date: 10/13/22  Original Order:  metFORMIN (GLUCOPHAGE-XR) 500 MG 24 hr tablet [[id number]]   amLODipine (NORVASC) 10 MG tablet [[id number]]    Order Details Dose, Route, Frequency: As Directed  Dispense Quantity: 90 tablet Refills: 0   Note to Pharmacy: Needs a physical       Sig: TAKE 1 TABLET BY MOUTH EVERY DAY       Start Date: 05/07/21 End Date: --  Written Date: 05/07/21 Expiration Date: 05/07/22  Original Order:  amLODipine (NORVASC) 10 MG tablet [[id number]]

## 2022-01-16 MED ORDER — AMLODIPINE BESYLATE 10 MG PO TABS: 10.0000 mg | ORAL_TABLET | Freq: Every day | ORAL | 0 refills | Status: DC

## 2022-01-16 MED ORDER — METFORMIN HCL ER 500 MG PO TB24: ORAL_TABLET | ORAL | 0 refills | Status: DC

## 2022-01-16 NOTE — Telephone Encounter (Signed)
Left a voicemail to call in and schedule yearly physical.  30 day courtesy supplies given for amlodipine and metformin.

## 2022-01-24 DIAGNOSIS — E1165 Type 2 diabetes mellitus with hyperglycemia: Secondary | ICD-10-CM | POA: Diagnosis not present

## 2022-01-24 DIAGNOSIS — I10 Essential (primary) hypertension: Secondary | ICD-10-CM | POA: Diagnosis not present

## 2022-01-26 ENCOUNTER — Ambulatory Visit
Admission: RE | Admit: 2022-01-26 | Discharge: 2022-01-26 | Disposition: A | Discharge status: Home / Self Care | Payer: Medicare PPO | Source: Ambulatory Visit | Attending: Family Medicine | Admitting: Family Medicine

## 2022-01-26 DIAGNOSIS — R911 Solitary pulmonary nodule: Secondary | ICD-10-CM

## 2022-01-26 DIAGNOSIS — I700 Atherosclerosis of aorta: Secondary | ICD-10-CM | POA: Diagnosis not present

## 2022-01-26 DIAGNOSIS — J439 Emphysema, unspecified: Secondary | ICD-10-CM | POA: Diagnosis not present

## 2022-01-27 ENCOUNTER — Ambulatory Visit (INDEPENDENT_AMBULATORY_CARE_PROVIDER_SITE_OTHER): Payer: Medicare PPO | Admitting: Family Medicine

## 2022-01-27 VITALS — BP 120/72 | HR 84 | Ht 72.0 in | Wt 196.2 lb

## 2022-01-27 DIAGNOSIS — E7800 Pure hypercholesterolemia, unspecified: Secondary | ICD-10-CM | POA: Diagnosis not present

## 2022-01-27 DIAGNOSIS — I2510 Atherosclerotic heart disease of native coronary artery without angina pectoris: Secondary | ICD-10-CM

## 2022-01-27 DIAGNOSIS — Z23 Encounter for immunization: Secondary | ICD-10-CM | POA: Diagnosis not present

## 2022-01-27 DIAGNOSIS — E119 Type 2 diabetes mellitus without complications: Secondary | ICD-10-CM

## 2022-01-27 MED ORDER — TRIAMCINOLONE ACETONIDE 0.1 % EX CREA: 1.0000 | TOPICAL_CREAM | Freq: Two times a day (BID) | CUTANEOUS | 0 refills | Status: DC

## 2022-01-27 MED ORDER — EMPAGLIFLOZIN 25 MG PO TABS: 25.0000 mg | ORAL_TABLET | Freq: Every day | ORAL | 3 refills | Status: DC

## 2022-01-27 NOTE — Progress Notes (Signed)
Subjective:    Patient ID: Vincent Short, male    DOB: 1950/07/14, 71 y.o.   MRN: [id number]  HPI Patient is here today for a checkup.  Since I last saw him, he has changed his lifestyle considerably and has lost substantial weight: Wt Readings from Last 3 Encounters:  01/27/22 196 lb 3.2 oz (89 kg)  07/11/21 199 lb (90.3 kg)  02/10/21 214 lb (97.1 kg)   He is doing well on the metformin.  He reports fasting blood sugars between 80 and 130.  He has not seen a blood sugar over 130.  He is interested in switching the metformin to something that may have additional benefit such as Jardiance or Ozempic.  At the present time his BMI is actually good at 26 I do not feel that he needs additional weight loss however he does have a history of cardiovascular disease so I feel that he would likely benefit to take Jardiance in addition to his sugar controlling benefits.  He denies any nausea or vomiting or diarrhea.  He denies any fevers or chills.  He is overdue for a eye exam.  Diabetic foot exam was performed today and is normal Past Medical History:  Diagnosis Date   Allergic rhinitis    Asthma    Atelectasis    Rt upper lobe   COPD (chronic obstructive pulmonary disease) (HCC)    GERD (gastroesophageal reflux disease)    History of colon polyps 03/14/2009   Tubular adenomatous and Hyperplastic polyps   HTN (hypertension)    Hyperlipidemia    Lung mass    noted on CT scan(12/24/10)    S/P cardiac cath    negative   Tobacco abuse    Treadmill stress test negative for angina pectoris    Dr. Claiborne Billings   Past Surgical History:  Procedure Laterality Date   Arthroscopy left wrist with radial styloidectomy.  07/06/2000   Dr Fredna Dow   Bronchoscopy with airway inspection, brush cytology,  11/22/2006   Dr Halford Chessman   CARDIAC CATHETERIZATION  07/05/2002   Dr Horton Marshall LVF, SMOOTH 10% TO 20% OSTIAL AND 10%-20% LUMINAL IRREGULARITY OF THE PROXIMAL LAD, SMOOTH 30%-40% PROXIMAL RCA,WHICH IMPROVED  FOLLOWING INTRACORONARY NTG ADMINISTRATION SUGGESTING CORONARY VASOSPASM. NO AORTOILIAC DISEASE.   DOPPLER ECHOCARDIOGRAPHY N/A 06/22/08   LV SIZE IS NORMAL. LV FUNCTION IS NORMAL. EF=>55%. THERE IS BORDERLINE CONCENTRIC LVH.   FRACTURE SURGERY     Left wrist scaphoid fx   FRACTURE SURGERY     Left ankle surgery   Left ankle surgery  11/11/2005   Dr Noemi Chapel   LUNG SURGERY  01/21/11   Dr. Cyndia Bent   FOB, R THORACOTOMY WITH RIGHT UPPER LOBECTOMY   LUNG SURGERY  01/21/11   FOB, R THORACOTOMY WITH RIGHT UPPER LOBECTOMY   NASAL SINUS SURGERY  1990   NUCLEAR STRESS TEST N/A 09/09/10   NORMAL PATTERN OF PERFUSION IN ALL REGIONS. EF IS 63%. NO WALL MOTION ABNORMALITIES.   ROTATOR CUFF REPAIR  pending   Dr. Noemi Chapel   TONSILLECTOMY     Current Outpatient Medications on File Prior to Visit  Medication Sig Dispense Refill   albuterol (PROVENTIL HFA;VENTOLIN HFA) 108 (90 Base) MCG/ACT inhaler Inhale 2 puffs into the lungs every 6 (six) hours as needed for wheezing or shortness of breath. 1 Inhaler 5   albuterol (PROVENTIL) (2.5 MG/3ML) 0.083% nebulizer solution Take 3 mLs (2.5 mg total) by nebulization every 6 (six) hours as needed for wheezing or shortness of breath. 150 mL  1   ALPRAZolam (XANAX) 0.5 MG tablet Take 1 tablet (0.5 mg total) by mouth 3 (three) times daily as needed for anxiety. 60 tablet 0   amLODipine (NORVASC) 10 MG tablet Take 1 tablet (10 mg total) by mouth daily. 30 tablet 0   atorvastatin (LIPITOR) 80 MG tablet Take 1 tablet (80 mg total) by mouth daily. 90 tablet 3   Blood Glucose Monitoring Suppl (BLOOD GLUCOSE SYSTEM PAK) KIT Please dispense based on patient and insurance preference. Use as directed to monitor FSBS 1x daily. Dx: E11.9. 1 kit 1   budesonide-formoterol (SYMBICORT) 160-4.5 MCG/ACT inhaler Inhale 2 puffs into the lungs 2 (two) times daily.       fluticasone (FLONASE) 50 MCG/ACT nasal spray PLACE 2 SPRAYS INTO THE NOSTRIL(S) DAILY 48 g 3   Glucose Blood (BLOOD GLUCOSE TEST  STRIPS) STRP Please dispense based on patient and insurance preference. Use as directed to monitor FSBS 1x daily. Dx: E11.9. 100 strip 11   ipratropium (ATROVENT) 0.06 % nasal spray Place 2 sprays into both nostrils 4 (four) times daily. 15 mL 12   Lancets MISC Please dispense based on patient and insurance preference. Use as directed to monitor FSBS 1x daily. Dx: E11.9. 100 each 1   levocetirizine (XYZAL ALLERGY 24HR) 5 MG tablet Take 1 tablet (5 mg total) by mouth every evening. 30 tablet 11   losartan (COZAAR) 100 MG tablet TAKE 1 TABLET(100 MG) BY MOUTH DAILY 90 tablet 1   metFORMIN (GLUCOPHAGE-XR) 500 MG 24 hr tablet TAKE 2 TABLETS BY MOUTH EVERY DAY WITH BREAKFAST 60 tablet 0   metoprolol succinate (TOPROL-XL) 25 MG 24 hr tablet TAKE 1 TABLET BY MOUTH EVERY DAY 90 tablet 1   montelukast (SINGULAIR) 10 MG tablet Take 1 pill daily. 90 tablet 1   Multiple Vitamin (MULTIVITAMIN) tablet Take 1 tablet by mouth daily.     niacin (NIASPAN) 1000 MG CR tablet TAKE 1 TABLET (1,000 MG TOTAL) BY MOUTH AT BEDTIME. 30 tablet 5   No current facility-administered medications on file prior to visit.   Allergies  Allergen Reactions   Contrast Media [Iodinated Contrast Media] Swelling    Allergic reaction 1987, ok w/ 13 hr prep//a.c.   Social History   Socioeconomic History   Marital status: Married    Spouse name: Not on file   Number of children: Not on file   Years of education: Not on file   Highest education level: Not on file  Occupational History   Occupation: heating and AC    Employer: [employer]  Tobacco Use   Smoking status: Former    Types: Cigarettes    Quit date: 12/29/2010    Years since quitting: 11.0   Smokeless tobacco: Never  Substance and Sexual Activity   Alcohol use: Yes   Drug use: No   Sexual activity: Not on file  Other Topics Concern   Not on file  Social History Narrative    He is married.  He works in Water quality scientist.  He smoked 1 pack of  cigarettes a day for the last 35     years.  He has occasional alcohol use.    Social Determinants of Health   Financial Resource Strain: Not on file  Food Insecurity: Not on file  Transportation Needs: Not on file  Physical Activity: Not on file  Stress: Not on file  Social Connections: Not on file  Intimate Partner Violence: Not on file  Review of Systems  All other systems reviewed and are negative.      Objective:   Physical Exam Vitals reviewed.  Constitutional:      Appearance: Normal appearance. He is normal weight.  HENT:     Right Ear: Tympanic membrane and ear canal normal.     Left Ear: Tympanic membrane and ear canal normal.     Mouth/Throat:     Pharynx: No oropharyngeal exudate.  Eyes:     Extraocular Movements: Extraocular movements intact.     Conjunctiva/sclera: Conjunctivae normal.     Pupils: Pupils are equal, round, and reactive to light.  Neck:     Thyroid: No thyroid mass, thyromegaly or thyroid tenderness.     Vascular: No carotid bruit or JVD.  Cardiovascular:     Rate and Rhythm: Normal rate.     Heart sounds: Normal heart sounds. No murmur heard.    No friction rub. No gallop.  Pulmonary:     Effort: Pulmonary effort is normal. No respiratory distress.     Breath sounds: Normal breath sounds. No wheezing or rales.  Abdominal:     General: Bowel sounds are normal. There is no distension.     Palpations: Abdomen is soft.     Tenderness: There is no abdominal tenderness. There is no guarding or rebound.  Musculoskeletal:     Cervical back: Neck supple.     Right lower leg: No edema.     Left lower leg: No edema.  Lymphadenopathy:     Cervical: No cervical adenopathy.  Skin:    Coloration: Skin is not jaundiced.     Findings: No bruising, erythema, lesion or rash.  Neurological:     General: No focal deficit present.     Mental Status: He is alert and oriented to person, place, and time. Mental status is at baseline.     Cranial  Nerves: No cranial nerve deficit.     Motor: No weakness.     Gait: Gait normal.  Psychiatric:        Mood and Affect: Mood normal.        Behavior: Behavior normal.        Thought Content: Thought content normal.        Judgment: Judgment normal.          Assessment & Plan:   Diabetes mellitus without complication ([place]) - Plan: CBC with Differential/Platelet, Lipid panel, Microalbumin, urine, COMPLETE METABOLIC PANEL WITH GFR, Hemoglobin A1c  Coronary artery disease involving native coronary artery of native heart without angina pectoris  Pure hypercholesterolemia Blood pressure and blood sugars are outstanding.  I have recommended stopping the and replacing with Jardiance to obtain additional cardiovascular benefit.  Start Jardiance 25 mg daily.  Diabetic foot exam is normal.  Recommended annual diabetic eye exam.  Check CBC CMP and lipid panel.  Goal LDL cholesterol is less than 70.  Patient received his flu shot today

## 2022-01-28 LAB — COMPLETE METABOLIC PANEL WITH GFR
AG Ratio: 1.6 (calc) (ref 1.0–2.5)
ALT: 19 U/L (ref 9–46)
AST: 21 U/L (ref 10–35)
Albumin: 4.3 g/dL (ref 3.6–5.1)
Alkaline phosphatase (APISO): 80 U/L (ref 35–144)
BUN: 17 mg/dL (ref 7–25)
CO2: 28 mmol/L (ref 20–32)
Calcium: 9.7 mg/dL (ref 8.6–10.3)
Chloride: 105 mmol/L (ref 98–110)
Creat: 1 mg/dL (ref 0.70–1.28)
Globulin: 2.7 g/dL (calc) (ref 1.9–3.7)
Glucose, Bld: 127 mg/dL — ABNORMAL HIGH (ref 65–99)
Potassium: 4.9 mmol/L (ref 3.5–5.3)
Sodium: 140 mmol/L (ref 135–146)
Total Bilirubin: 0.4 mg/dL (ref 0.2–1.2)
Total Protein: 7 g/dL (ref 6.1–8.1)
eGFR: 80 mL/min/{1.73_m2} (ref >=60)

## 2022-01-28 LAB — HEMOGLOBIN A1C
Hgb A1c MFr Bld: 6.6 % of total Hgb — ABNORMAL HIGH (ref <5.7)
Mean Plasma Glucose: 143 mg/dL
eAG (mmol/L): 7.9 mmol/L

## 2022-01-28 LAB — CBC WITH DIFFERENTIAL/PLATELET
Absolute Monocytes: 854 cells/uL (ref 200–950)
Basophils Absolute: 71 cells/uL (ref 0–200)
Basophils Relative: 0.8 %
Eosinophils Absolute: 401 cells/uL (ref 15–500)
Eosinophils Relative: 4.5 %
HCT: 44.9 % (ref 38.5–50.0)
Hemoglobin: 15.4 g/dL (ref 13.2–17.1)
Lymphs Abs: 1593 cells/uL (ref 850–3900)
MCH: 31.3 pg (ref 27.0–33.0)
MCHC: 34.3 g/dL (ref 32.0–36.0)
MCV: 91.3 fL (ref 80.0–100.0)
MPV: 10.8 fL (ref 7.5–12.5)
Monocytes Relative: 9.6 %
Neutro Abs: 5981 cells/uL (ref 1500–7800)
Neutrophils Relative %: 67.2 %
Platelets: 298 10*3/uL (ref 140–400)
RBC: 4.92 10*6/uL (ref 4.20–5.80)
RDW: 12.3 % (ref 11.0–15.0)
Total Lymphocyte: 17.9 %
WBC: 8.9 10*3/uL (ref 3.8–10.8)

## 2022-01-28 LAB — LIPID PANEL
Cholesterol: 132 mg/dL (ref <200)
HDL: 36 mg/dL — ABNORMAL LOW (ref >=40)
LDL Cholesterol (Calc): 75 mg/dL (calc)
Non-HDL Cholesterol (Calc): 96 mg/dL (calc) (ref <130)
Total CHOL/HDL Ratio: 3.7 (calc) (ref <5.0)
Triglycerides: 124 mg/dL (ref <150)

## 2022-01-28 LAB — MICROALBUMIN, URINE: Microalb, Ur: 1 mg/dL

## 2022-01-30 ENCOUNTER — Other Ambulatory Visit: Payer: Self-pay | Admitting: Family Medicine

## 2022-01-30 MED ORDER — METFORMIN HCL ER 500 MG PO TB24: ORAL_TABLET | ORAL | 3 refills | Status: DC

## 2022-02-09 ENCOUNTER — Other Ambulatory Visit: Payer: Self-pay | Admitting: Family Medicine

## 2022-02-11 DIAGNOSIS — I10 Essential (primary) hypertension: Secondary | ICD-10-CM | POA: Diagnosis not present

## 2022-02-11 DIAGNOSIS — E1165 Type 2 diabetes mellitus with hyperglycemia: Secondary | ICD-10-CM | POA: Diagnosis not present

## 2022-02-24 ENCOUNTER — Other Ambulatory Visit: Payer: Self-pay | Admitting: Family Medicine

## 2022-02-24 DIAGNOSIS — E1165 Type 2 diabetes mellitus with hyperglycemia: Secondary | ICD-10-CM | POA: Diagnosis not present

## 2022-02-24 DIAGNOSIS — I10 Essential (primary) hypertension: Secondary | ICD-10-CM | POA: Diagnosis not present

## 2022-03-04 ENCOUNTER — Other Ambulatory Visit: Payer: Self-pay | Admitting: Family Medicine

## 2022-03-04 NOTE — Telephone Encounter (Signed)
Requested Prescriptions  Pending Prescriptions Disp Refills   amLODipine (NORVASC) 10 MG tablet [Pharmacy Med Name: AMLODIPINE BESYLATE 10 MG TAB] 90 tablet 1    Sig: TAKE 1 TABLET BY MOUTH EVERY DAY     Cardiovascular: Calcium Channel Blockers 2 Failed - 03/04/2022 12:30 PM      Failed - Valid encounter within last 6 months    Recent Outpatient Visits           7 months ago General medical exam   [place] Susy Frizzle, MD   11 months ago Viral upper respiratory tract infection   [place] Eulogio Bear, NP   1 year ago Fatigue, unspecified type   [place] Pickard, Cammie Mcgee, MD   2 years ago Screen for colon cancer   [place] Susy Frizzle, MD   2 years ago Encounter for screening for lung cancer   [place] Pickard, Cammie Mcgee, MD       Future Appointments             In 4 months Pickard, Cammie Mcgee, MD [place], [facility]            Passed - Last BP in normal range    BP Readings from Last 1 Encounters:  01/27/22 120/72         Passed - Last Heart Rate in normal range    Pulse Readings from Last 1 Encounters:  01/27/22 84

## 2022-03-11 ENCOUNTER — Other Ambulatory Visit: Payer: Self-pay | Admitting: Family Medicine

## 2022-03-13 DIAGNOSIS — E1165 Type 2 diabetes mellitus with hyperglycemia: Secondary | ICD-10-CM | POA: Diagnosis not present

## 2022-03-13 DIAGNOSIS — I10 Essential (primary) hypertension: Secondary | ICD-10-CM | POA: Diagnosis not present

## 2022-04-09 ENCOUNTER — Other Ambulatory Visit: Payer: Self-pay | Admitting: Family Medicine

## 2022-04-10 NOTE — Telephone Encounter (Signed)
Requested medication (s) are due for refill today: routing for review  Requested medication (s) are on the active medication list: yes  Last refill:  03/11/22  Future visit scheduled: yes  Notes to clinic:  Unable to refill per protocol, cannot delegate.      Requested Prescriptions  Pending Prescriptions Disp Refills   triamcinolone cream (KENALOG) 0.1 % [Pharmacy Med Name: TRIAMCINOLONE 0.1% CREAM] 30 g 0    Sig: APPLY TO AFFECTED AREA TWICE A DAY     Not Delegated - Dermatology:  Corticosteroids Failed - 04/09/2022  6:02 PM      Failed - This refill cannot be delegated      Failed - Valid encounter within last 12 months    Recent Outpatient Visits           9 months ago General medical exam   [place] Susy Frizzle, MD   1 year ago Viral upper respiratory tract infection   [place] Eulogio Bear, NP   1 year ago Fatigue, unspecified type   [place] Susy Frizzle, MD   2 years ago Screen for colon cancer   [place] Susy Frizzle, MD   2 years ago Encounter for screening for lung cancer   [place] Pickard, Cammie Mcgee, MD       Future Appointments             In 3 months Pickard, Cammie Mcgee, MD [place]

## 2022-04-26 DIAGNOSIS — E1165 Type 2 diabetes mellitus with hyperglycemia: Secondary | ICD-10-CM | POA: Diagnosis not present

## 2022-04-26 DIAGNOSIS — I10 Essential (primary) hypertension: Secondary | ICD-10-CM | POA: Diagnosis not present

## 2022-05-15 ENCOUNTER — Other Ambulatory Visit: Payer: Self-pay | Admitting: Family Medicine

## 2022-06-02 ENCOUNTER — Telehealth: Payer: Self-pay | Admitting: Family Medicine

## 2022-06-02 ENCOUNTER — Other Ambulatory Visit: Payer: Self-pay

## 2022-06-02 DIAGNOSIS — J329 Chronic sinusitis, unspecified: Secondary | ICD-10-CM

## 2022-06-02 MED ORDER — MONTELUKAST SODIUM 10 MG PO TABS: ORAL_TABLET | ORAL | 1 refills | Status: DC

## 2022-06-02 NOTE — Telephone Encounter (Signed)
Prescription Request  06/02/2022  Is this a "Controlled Substance" medicine? No  LOV: 01/27/2022  What is the name of the medication or equipment?   montelukast (SINGULAIR) 10 MG tablet   Have you contacted your pharmacy to request a refill? Yes   Which pharmacy would you like this sent to?  [id number] [id number]: [phone]: [phone]   Patient notified that their request is being sent to the clinical staff for review and that they should receive a response within 2 business days.   Please advise pharmacist.

## 2022-06-28 ENCOUNTER — Other Ambulatory Visit: Payer: Self-pay | Admitting: Family Medicine

## 2022-07-02 ENCOUNTER — Other Ambulatory Visit: Payer: Self-pay | Admitting: Family Medicine

## 2022-07-14 ENCOUNTER — Encounter: Payer: Medicare PPO | Admitting: Family Medicine

## 2022-07-21 ENCOUNTER — Telehealth: Payer: Self-pay | Admitting: Family Medicine

## 2022-07-21 NOTE — Telephone Encounter (Signed)
Called patient to schedule Medicare Annual Wellness Visit (AWV). Left message for patient to call back and schedule Medicare Annual Wellness Visit (AWV).  Last date of AWV: 07/13/22   Please schedule an appointment at any time with Loma Sousa, [facility] .  If any questions, please contact me at [phone].  Thank you,  Colletta Maryland,  [place] Program Direct Dial ??[phone]

## 2022-07-31 ENCOUNTER — Ambulatory Visit (INDEPENDENT_AMBULATORY_CARE_PROVIDER_SITE_OTHER): Payer: Medicare PPO | Admitting: Family Medicine

## 2022-07-31 ENCOUNTER — Encounter: Payer: Self-pay | Admitting: Family Medicine

## 2022-07-31 DIAGNOSIS — J441 Chronic obstructive pulmonary disease with (acute) exacerbation: Secondary | ICD-10-CM

## 2022-07-31 MED ORDER — PREDNISONE 20 MG PO TABS: ORAL_TABLET | ORAL | 0 refills | Status: DC

## 2022-07-31 MED ORDER — ALBUTEROL SULFATE HFA 108 (90 BASE) MCG/ACT IN AERS: 2.0000 | INHALATION_SPRAY | Freq: Four times a day (QID) | RESPIRATORY_TRACT | 5 refills | Status: DC | PRN

## 2022-07-31 MED ORDER — AZITHROMYCIN 250 MG PO TABS: ORAL_TABLET | ORAL | 0 refills | Status: DC

## 2022-07-31 NOTE — Progress Notes (Signed)
Subjective:    Patient ID: Vincent Short, male    DOB: December 02, 1950, 72 y.o.   MRN: [id number]  Cough  Patient states that he has bronchitis.  He reports wheezing and coughing.  He reports a cough productive of brown sputum.  He reports some shortness of breath that improved slightly with albuterol.  He states that this began with allergies.  However he denies any sinus pain or sinus pressure.  Instead he reports wheezing and coughing. Past Medical History:  Diagnosis Date   Allergic rhinitis    Asthma    Atelectasis    Rt upper lobe   COPD (chronic obstructive pulmonary disease)    GERD (gastroesophageal reflux disease)    History of colon polyps 03/14/2009   Tubular adenomatous and Hyperplastic polyps   HTN (hypertension)    Hyperlipidemia    Lung mass    noted on CT scan(12/24/10)    S/P cardiac cath    negative   Tobacco abuse    Treadmill stress test negative for angina pectoris    Dr. Tresa Endo   Past Surgical History:  Procedure Laterality Date   Arthroscopy left wrist with radial styloidectomy.  07/06/2000   Dr Merlyn Lot   Bronchoscopy with airway inspection, brush cytology,  11/22/2006   Dr Craige Cotta   CARDIAC CATHETERIZATION  07/05/2002   Dr Martyn Ehrich LVF, SMOOTH 10% TO 20% OSTIAL AND 10%-20% LUMINAL IRREGULARITY OF THE PROXIMAL LAD, SMOOTH 30%-40% PROXIMAL RCA,WHICH IMPROVED FOLLOWING INTRACORONARY NTG ADMINISTRATION SUGGESTING CORONARY VASOSPASM. NO AORTOILIAC DISEASE.   DOPPLER ECHOCARDIOGRAPHY N/A 06/22/08   LV SIZE IS NORMAL. LV FUNCTION IS NORMAL. EF=>55%. THERE IS BORDERLINE CONCENTRIC LVH.   FRACTURE SURGERY     Left wrist scaphoid fx   FRACTURE SURGERY     Left ankle surgery   Left ankle surgery  11/11/2005   Dr Thurston Hole   LUNG SURGERY  01/21/11   Dr. Laneta Simmers   FOB, R THORACOTOMY WITH RIGHT UPPER LOBECTOMY   LUNG SURGERY  01/21/11   FOB, R THORACOTOMY WITH RIGHT UPPER LOBECTOMY   NASAL SINUS SURGERY  1990   NUCLEAR STRESS TEST N/A 09/09/10   NORMAL PATTERN OF  PERFUSION IN ALL REGIONS. EF IS 63%. NO WALL MOTION ABNORMALITIES.   ROTATOR CUFF REPAIR  pending   Dr. Thurston Hole   TONSILLECTOMY     Current Outpatient Medications on File Prior to Visit  Medication Sig Dispense Refill   ACCU-CHEK GUIDE test strip USE AS DIRECTED DAILY 100 strip 11   amLODipine (NORVASC) 10 MG tablet TAKE 1 TABLET BY MOUTH EVERY DAY 90 tablet 1   atorvastatin (LIPITOR) 80 MG tablet Take 1 tablet (80 mg total) by mouth daily. 90 tablet 3   Blood Glucose Monitoring Suppl (BLOOD GLUCOSE SYSTEM PAK) KIT Please dispense based on patient and insurance preference. Use as directed to monitor FSBS 1x daily. Dx: E11.9. 1 kit 1   budesonide-formoterol (SYMBICORT) 160-4.5 MCG/ACT inhaler Inhale 2 puffs into the lungs 2 (two) times daily.       empagliflozin (JARDIANCE) 25 MG TABS tablet Take 1 tablet (25 mg total) by mouth daily before breakfast. 90 tablet 3   fluticasone (FLONASE) 50 MCG/ACT nasal spray PLACE 2 SPRAYS INTO THE NOSTRIL(S) DAILY 48 mL 3   ipratropium (ATROVENT) 0.06 % nasal spray Place 2 sprays into both nostrils 4 (four) times daily. 15 mL 12   Lancets MISC Please dispense based on patient and insurance preference. Use as directed to monitor FSBS 1x daily. Dx: E11.9. 100 each  1   levocetirizine (XYZAL) 5 MG tablet TAKE 1 TABLET BY MOUTH EVERY DAY IN THE EVENING 90 tablet 3   losartan (COZAAR) 100 MG tablet TAKE 1 TABLET(100 MG) BY MOUTH DAILY 90 tablet 1   metFORMIN (GLUCOPHAGE-XR) 500 MG 24 hr tablet TAKE 2 TABLETS BY MOUTH EVERY DAY WITH BREAKFAST 180 tablet 3   metoprolol succinate (TOPROL-XL) 25 MG 24 hr tablet TAKE 1 TABLET BY MOUTH EVERY DAY 90 tablet 1   montelukast (SINGULAIR) 10 MG tablet Take 1 pill daily. 90 tablet 1   Multiple Vitamin (MULTIVITAMIN) tablet Take 1 tablet by mouth daily.     niacin (NIASPAN) 1000 MG CR tablet TAKE 1 TABLET (1,000 MG TOTAL) BY MOUTH AT BEDTIME. 30 tablet 5   triamcinolone cream (KENALOG) 0.1 % APPLY TO AFFECTED AREA TWICE A DAY 30  g 0   No current facility-administered medications on file prior to visit.   Allergies  Allergen Reactions   Contrast Media [Iodinated Contrast Media] Swelling    Allergic reaction 1987, ok w/ 13 hr prep//a.c.   Social History   Socioeconomic History   Marital status: Married    Spouse name: Not on file   Number of children: Not on file   Years of education: Not on file   Highest education level: Not on file  Occupational History   Occupation: heating and AC    Employer: [employer]  Tobacco Use   Smoking status: Former    Types: Cigarettes    Quit date: 12/29/2010    Years since quitting: 11.5   Smokeless tobacco: Never  Substance and Sexual Activity   Alcohol use: Yes   Drug use: No   Sexual activity: Not on file  Other Topics Concern   Not on file  Social History Narrative    He is married.  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  He smoked 1 pack of cigarettes a day for the last 35     years.  He has occasional alcohol use.    Social Determinants of Health   Financial Resource Strain: Not on file  Food Insecurity: Not on file  Transportation Needs: Not on file  Physical Activity: Not on file  Stress: Not on file  Social Connections: Not on file  Intimate Partner Violence: Not on file     Review of Systems  Respiratory:  Positive for cough.   All other systems reviewed and are negative.      Objective:   Physical Exam Vitals reviewed.  Constitutional:      Appearance: Normal appearance. He is normal weight.  HENT:     Right Ear: Tympanic membrane and ear canal normal.     Left Ear: Tympanic membrane and ear canal normal.     Mouth/Throat:     Pharynx: No oropharyngeal exudate.  Eyes:     Extraocular Movements: Extraocular movements intact.     Conjunctiva/sclera: Conjunctivae normal.     Pupils: Pupils are equal, round, and reactive to light.  Neck:     Thyroid: No thyroid mass, thyromegaly or thyroid tenderness.     Vascular: No  carotid bruit or JVD.  Cardiovascular:     Rate and Rhythm: Tachycardia present.     Heart sounds: Normal heart sounds. No murmur heard.    No friction rub. No gallop.  Pulmonary:     Effort: Pulmonary effort is normal. No respiratory distress.     Breath sounds: Wheezing and rhonchi present. No  rales.  Abdominal:     General: Bowel sounds are normal. There is no distension.     Palpations: Abdomen is soft.     Tenderness: There is no abdominal tenderness. There is no guarding or rebound.  Musculoskeletal:     Cervical back: Neck supple.     Right lower leg: No edema.     Left lower leg: No edema.  Lymphadenopathy:     Cervical: No cervical adenopathy.  Skin:    Coloration: Skin is not jaundiced.     Findings: No bruising, erythema, lesion or rash.  Neurological:     General: No focal deficit present.     Mental Status: He is alert and oriented to person, place, and time. Mental status is at baseline.     Cranial Nerves: No cranial nerve deficit.     Motor: No weakness.     Gait: Gait normal.  Psychiatric:        Mood and Affect: Mood normal.        Behavior: Behavior normal.        Thought Content: Thought content normal.        Judgment: Judgment normal.           Assessment & Plan:   COPD exacerbation - Add azithromycin to his amox to cover atypicals has quite a pulmonary infection history, and lung surgery, prednisone, albuterol nebs, mucinex - Plan: albuterol (VENTOLIN HFA) 108 (90 Base) MCG/ACT inhaler Begin prednisone taper pack.  Add Z-Pak for purulent sputum.  Use albuterol 2 puffs every 4-6 hours as needed.  Recheck next week if no better or sooner if worse.

## 2022-08-06 ENCOUNTER — Telehealth: Payer: Self-pay | Admitting: Family Medicine

## 2022-08-06 NOTE — Telephone Encounter (Signed)
Contacted Vincent Short to schedule their annual wellness visit. Appointment made for 08/13/2022.  Thank you,  Judeth Cornfield,  [facility] Clinical Support [facility] AWV Program Direct Dial ??[phone]

## 2022-08-14 ENCOUNTER — Encounter: Payer: Self-pay | Admitting: Family Medicine

## 2022-08-14 ENCOUNTER — Telehealth: Payer: Self-pay

## 2022-08-14 ENCOUNTER — Ambulatory Visit (INDEPENDENT_AMBULATORY_CARE_PROVIDER_SITE_OTHER): Payer: Medicare PPO

## 2022-08-14 DIAGNOSIS — Z0000 Encounter for general adult medical examination without abnormal findings: Secondary | ICD-10-CM

## 2022-08-14 NOTE — Telephone Encounter (Signed)
Called and spoke w/pt about pcp's recommendations " I am fine with that as long as his sugars remained well controlled."   Per pt aware and voiced understanding.  Nothing further.

## 2022-08-14 NOTE — Telephone Encounter (Signed)
During our AWV phone visit, pt wanted to let you know that as of 10/24 or so, pt has been taking 1 metformin tab along w/1 jardiance tab. Per pt it has help to keep his sugars down averaging at about the 90's. Pt wanted to check to see if this would be ok with you.   Please advice.

## 2022-08-14 NOTE — Progress Notes (Signed)
Subjective:   Vincent Short is a 72 y.o. male who presents for Medicare Annual/Subsequent preventive examination.  Review of Systems     Cardiac Risk Factors include: advanced age (>71men, >80 women);smoking/ tobacco exposure;sedentary lifestyle     Objective:    There were no vitals filed for this visit. There is no height or weight on file to calculate BMI.     08/14/2022    2:24 PM 01/25/2020   10:32 AM  Advanced Directives  Does Patient Have a Medical Advance Directive? Yes Yes  Type of Estate agent of [place];Living will Healthcare Power of [place];Living will    Current Medications (verified) Outpatient Encounter Medications as of 08/14/2022  Medication Sig   ACCU-CHEK GUIDE test strip USE AS DIRECTED DAILY   albuterol (VENTOLIN HFA) 108 (90 Base) MCG/ACT inhaler Inhale 2 puffs into the lungs every 6 (six) hours as needed for wheezing or shortness of breath.   amLODipine (NORVASC) 10 MG tablet TAKE 1 TABLET BY MOUTH EVERY DAY   atorvastatin (LIPITOR) 80 MG tablet Take 1 tablet (80 mg total) by mouth daily.   azithromycin (ZITHROMAX) 250 MG tablet 2 tabs poqday1, 1 tab poqday 2-5   Blood Glucose Monitoring Suppl (BLOOD GLUCOSE SYSTEM PAK) KIT Please dispense based on patient and insurance preference. Use as directed to monitor FSBS 1x daily. Dx: E11.9.   budesonide-formoterol (SYMBICORT) 160-4.5 MCG/ACT inhaler Inhale 2 puffs into the lungs 2 (two) times daily.     empagliflozin (JARDIANCE) 25 MG TABS tablet Take 1 tablet (25 mg total) by mouth daily before breakfast.   fluticasone (FLONASE) 50 MCG/ACT nasal spray PLACE 2 SPRAYS INTO THE NOSTRIL(S) DAILY   ipratropium (ATROVENT) 0.06 % nasal spray Place 2 sprays into both nostrils 4 (four) times daily.   Lancets MISC Please dispense based on patient and insurance preference. Use as directed to monitor FSBS 1x daily. Dx: E11.9.   levocetirizine (XYZAL) 5 MG tablet TAKE 1 TABLET BY MOUTH EVERY DAY  IN THE EVENING   losartan (COZAAR) 100 MG tablet TAKE 1 TABLET(100 MG) BY MOUTH DAILY   metFORMIN (GLUCOPHAGE-XR) 500 MG 24 hr tablet TAKE 2 TABLETS BY MOUTH EVERY DAY WITH BREAKFAST   metoprolol succinate (TOPROL-XL) 25 MG 24 hr tablet TAKE 1 TABLET BY MOUTH EVERY DAY   montelukast (SINGULAIR) 10 MG tablet Take 1 pill daily.   Multiple Vitamin (MULTIVITAMIN) tablet Take 1 tablet by mouth daily.   niacin (NIASPAN) 1000 MG CR tablet TAKE 1 TABLET (1,000 MG TOTAL) BY MOUTH AT BEDTIME.   predniSONE (DELTASONE) 20 MG tablet 3 tabs poqday 1-2, 2 tabs poqday 3-4, 1 tab poqday 5-6   triamcinolone cream (KENALOG) 0.1 % APPLY TO AFFECTED AREA TWICE A DAY   No facility-administered encounter medications on file as of 08/14/2022.    Allergies (verified) Contrast media [iodinated contrast media]   History: Past Medical History:  Diagnosis Date   Allergic rhinitis    Asthma    Atelectasis    Rt upper lobe   COPD (chronic obstructive pulmonary disease)    GERD (gastroesophageal reflux disease)    History of colon polyps 03/14/2009   Tubular adenomatous and Hyperplastic polyps   HTN (hypertension)    Hyperlipidemia    Lung mass    noted on CT scan(12/24/10)    S/P cardiac cath    negative   Tobacco abuse    Treadmill stress test negative for angina pectoris    Dr. Tresa Endo   Past Surgical History:  Procedure Laterality Date   Arthroscopy left wrist with radial styloidectomy.  07/06/2000   Dr Merlyn Lot   Bronchoscopy with airway inspection, brush cytology,  11/22/2006   Dr Craige Cotta   CARDIAC CATHETERIZATION  07/05/2002   Dr Martyn Ehrich LVF, SMOOTH 10% TO 20% OSTIAL AND 10%-20% LUMINAL IRREGULARITY OF THE PROXIMAL LAD, SMOOTH 30%-40% PROXIMAL RCA,WHICH IMPROVED FOLLOWING INTRACORONARY NTG ADMINISTRATION SUGGESTING CORONARY VASOSPASM. NO AORTOILIAC DISEASE.   DOPPLER ECHOCARDIOGRAPHY N/A 06/22/08   LV SIZE IS NORMAL. LV FUNCTION IS NORMAL. EF=>55%. THERE IS BORDERLINE CONCENTRIC LVH.   FRACTURE  SURGERY     Left wrist scaphoid fx   FRACTURE SURGERY     Left ankle surgery   Left ankle surgery  11/11/2005   Dr Thurston Hole   LUNG SURGERY  01/21/11   Dr. Laneta Simmers   FOB, R THORACOTOMY WITH RIGHT UPPER LOBECTOMY   LUNG SURGERY  01/21/11   FOB, R THORACOTOMY WITH RIGHT UPPER LOBECTOMY   NASAL SINUS SURGERY  1990   NUCLEAR STRESS TEST N/A 09/09/10   NORMAL PATTERN OF PERFUSION IN ALL REGIONS. EF IS 63%. NO WALL MOTION ABNORMALITIES.   ROTATOR CUFF REPAIR  pending   Dr. Thurston Hole   TONSILLECTOMY     Family History  Problem Relation Age of Onset   Coronary artery disease Father    Allergic rhinitis Father    Heart disease Father    Coronary artery disease Mother    Allergic rhinitis Mother    Heart disease Mother    Kidney disease Mother    Stroke Mother    Social History   Socioeconomic History   Marital status: Married    Spouse name: Not on file   Number of children: Not on file   Years of education: Not on file   Highest education level: Not on file  Occupational History   Occupation: heating and AC    Employer: [employer]  Tobacco Use   Smoking status: Former    Types: Cigarettes    Quit date: 02/26/2020    Years since quitting: 2.4   Smokeless tobacco: Never  Substance and Sexual Activity   Alcohol use: Yes   Drug use: No   Sexual activity: Not on file  Other Topics Concern   Not on file  Social History Narrative    He is married.  He works in Production assistant, radio.  He smoked 1 pack of cigarettes a day for the last 35     years.  He has occasional alcohol use.    Social Determinants of Health   Financial Resource Strain: Low Risk  (08/14/2022)   Overall Financial Resource Strain (CARDIA)    Difficulty of Paying Living Expenses: Not very hard  Food Insecurity: No Food Insecurity (08/14/2022)   Hunger Vital Sign    Worried About Running Out of Food in the Last Year: Never true    Ran Out of Food in the Last Year: Never true  Transportation Needs:  No Transportation Needs (08/14/2022)   PRAPARE - Administrator, Civil Service (Medical): No    Lack of Transportation (Non-Medical): No  Physical Activity: Insufficiently Active (08/14/2022)   Exercise Vital Sign    Days of Exercise per Week: 3 days    Minutes of Exercise per Session: 10 min  Stress: No Stress Concern Present (08/14/2022)   [employer] of Occupational Health - Occupational Stress Questionnaire    Feeling of Stress : Not at all  Social Connections: Moderately  Isolated (08/14/2022)   Social Connection and Isolation Panel [NHANES]    Frequency of Communication with Friends and Family: Once a week    Frequency of Social Gatherings with Friends and Family: Never    Attends Religious Services: Never    Database administrator or Organizations: Yes    Attends Engineer, structural: Never    Marital Status: Married    Tobacco Counseling Counseling given: Not Answered   Clinical Intake:  Pre-visit preparation completed: Yes  Pain : No/denies pain     Diabetes: Yes CBG done?: Yes CBG resulted in Enter/ Edit results?: Yes (66)  How often do you need to have someone help you when you read instructions, pamphlets, or other written materials from your doctor or pharmacy?: 1 - Never What is the last grade level you completed in school?: hs/and tech training  Diabetic?yes  Interpreter Needed?: No      Activities of Daily Living    08/14/2022    2:37 PM 08/14/2022    2:35 PM  In your present state of health, do you have any difficulty performing the following activities:  Hearing?  1  Comment  little bit on the left ear  Vision?  0  Difficulty concentrating or making decisions?  0  Walking or climbing stairs?  0  Dressing or bathing? 0   Doing errands, shopping? 0   Preparing Food and eating ? N   Using the Toilet? N   In the past six months, have you accidently leaked urine? N   Do you have problems with loss of bowel control? N    Managing your Medications? N   Managing your Finances? N   Housekeeping or managing your Housekeeping? N     Patient Care Team: Donita Brooks, MD as PCP - General (Family Medicine)  Indicate any recent Medical Services you may have received from other than [facility] providers in the past year (date may be approximate).     Assessment:   This is a routine wellness examination for Vincent Short.  Hearing/Vision screen No results found.  Dietary issues and exercise activities discussed: Current Exercise Habits: Home exercise routine, Type of exercise: walking;Other - see comments (biking/working yard/walking the dog), Time (Minutes): 30, Frequency (Times/Week): 3, Weekly Exercise (Minutes/Week): 90, Intensity: Mild (biking/working yard/walking the dog)   Goals Addressed   None    Depression Screen    08/14/2022    2:34 PM 01/27/2022    8:10 AM 07/11/2021    9:35 AM 01/25/2020   10:31 AM 07/20/2019   11:27 AM 02/25/2017   10:04 AM  PHQ 2/9 Scores  PHQ - 2 Score 0 0 0 0 0 0  PHQ- 9 Score   0       Fall Risk    01/27/2022    8:13 AM 07/11/2021    9:34 AM 01/25/2020   10:31 AM 07/20/2019   11:27 AM 03/11/2018    2:45 PM  Fall Risk   Falls in the past year? 0 0 0 0 0  Comment     Emmi Telephone Survey: data to providers prior to load  Number falls in past yr: 0 0 0    Injury with Fall? 0 0 0    Risk for fall due to : No Fall Risks      Follow up Falls prevention discussed   Falls evaluation completed     FALL RISK PREVENTION PERTAINING TO THE HOME:  Any stairs in or around the  home? Yes  If so, are there any without handrails? Yes  Home free of loose throw rugs in walkways, pet beds, electrical cords, etc? Yes  Adequate lighting in your home to reduce risk of falls? Yes   ASSISTIVE DEVICES UTILIZED TO PREVENT FALLS:  Life alert? No  Use of a cane, walker or w/c? No  Grab bars in the bathroom? Yes  Shower chair or bench in shower? No  Elevated toilet seat or a handicapped  toilet? Yes   TIMED UP AND GO:  Was the test performed? No .  Length of time to ambulate 10 feet:  Cognitive Function:       Immunizations Immunization History  Administered Date(s) Administered   Fluad Quad(high Dose 65+) 01/04/2019, 01/25/2020   Influenza, High Dose Seasonal PF 02/25/2017, 03/04/2018, 01/27/2022   Influenza,inj,Quad PF,6+ Mos 03/21/2013, 04/17/2014   Influenza-Unspecified 02/15/2003, 03/01/2004   PFIZER(Purple Top)SARS-COV-2 Vaccination 05/22/2019, 06/22/2019, 04/14/2020   Pneumococcal Conjugate-13 04/17/2014   Pneumococcal Polysaccharide-23 03/21/2013, 01/25/2020   Td 06/27/2002    Tdap: Per pt believes that he had it done. Advise pt to send records. & if he did not have it done. He can get it the next time he comes in for OV, pt voiced understanding.   Flu Vaccine status: Up to date  Pneumococcal vaccine status: Up to date  Covid-19 vaccine status: Declined, Education has been provided regarding the importance of this vaccine but patient still declined. Advised may receive this vaccine at local pharmacy or Health Dept.or vaccine clinic. Aware to provide a copy of the vaccination record if obtained from local pharmacy or Health Dept. Verbalized acceptance and understanding.  Qualifies for Shingles Vaccine? Yes   Zostavax completed Yes   Shingrix Completed?: Yes  Screening Tests Health Maintenance  Topic Date Due   Zoster Vaccines- Shingrix (1 of 2) Never done   DTaP/Tdap/Td (2 - Tdap) 06/26/2012   COVID-19 Vaccine (4 - 2023-24 season) 12/26/2021   HEMOGLOBIN A1C  07/29/2022   OPHTHALMOLOGY EXAM  02/25/2023 (Originally 10/30/1960)   COLONOSCOPY (Pts 45-25yrs Insurance coverage will need to be confirmed)  02/25/2023 (Originally 03/15/2019)   Diabetic kidney evaluation - Urine ACR  01/28/2027 (Originally 10/30/1968)   INFLUENZA VACCINE  11/26/2022   Diabetic kidney evaluation - eGFR measurement  01/28/2023   FOOT EXAM  01/28/2023   Medicare Annual Wellness  (AWV)  08/14/2023   Pneumonia Vaccine 32+ Years old  Completed   Hepatitis C Screening  Completed   HPV VACCINES  Aged Out    Health Maintenance  Health Maintenance Due  Topic Date Due   Zoster Vaccines- Shingrix (1 of 2) Never done   DTaP/Tdap/Td (2 - Tdap) 06/26/2012   COVID-19 Vaccine (4 - 2023-24 season) 12/26/2021   HEMOGLOBIN A1C  07/29/2022    Colorectal cancer screening: Referral to GI placed 08/14/22. Pt aware the office will call re: appt.  Lung Cancer Screening: (Low Dose CT Chest recommended if Age 59-80 years, 30 pack-year currently smoking OR have quit w/in 15years.) does qualify.   Lung Cancer Screening Referral: n/a  Additional Screening:  Hepatitis C Screening: does not qualify; Completed n/a  Vision Screening: Recommended annual ophthalmology exams for early detection of glaucoma and other disorders of the eye. Is the patient up to date with their annual eye exam?  No  Who is the provider or what is the name of the office in which the patient attends annual eye exams? Per pt will find a ophthalmologist If pt is not established with  a provider, would they like to be referred to a provider to establish care? No .   Dental Screening: Recommended annual dental exams for proper oral hygiene  Community Resource Referral / Chronic Care Management: CRR required this visit?  No   CCM required this visit?  No      Plan:     I have personally reviewed and noted the following in the patient's chart:   Medical and social history Use of alcohol, tobacco or illicit drugs  Current medications and supplements including opioid prescriptions. Patient is not currently taking opioid prescriptions. Functional ability and status Nutritional status Physical activity Advanced directives List of other physicians Hospitalizations, surgeries, and ER visits in previous 12 months Vitals Screenings to include cognitive, depression, and falls Referrals and appointments  In  addition, I have reviewed and discussed with patient certain preventive protocols, quality metrics, and best practice recommendations. A written personalized care plan for preventive services as well as general preventive health recommendations were provided to patient.   Virtual Visit via Telephone Note  I connected with Vincent Short on 08/14/22 at  2:10 PM EDT by telephone and verified that I am speaking with the correct person using two identifiers.  Location: Patient: at home Provider: clinic   I discussed the limitations, risks, security and privacy concerns of performing an evaluation and management service by telephone and the availability of in person appointments. I also discussed with the patient that there may be a patient responsible charge related to this service. The patient expressed understanding and agreed to proceed.   History of Present Illness:    Observations/Objective:   Assessment and Plan:   Follow Up Instructions:    I discussed the assessment and treatment plan with the patient. The patient was provided an opportunity to ask questions and all were answered. The patient agreed with the plan and demonstrated an understanding of the instructions.   The patient was advised to call back or seek an in-person evaluation if the symptoms worsen or if the condition fails to improve as anticipated.  I provided 30 minutes of non-face-to-face time during this encounter.   Arta Silence, CMA   Arta Silence, [place]   08/14/2022   Nurse Notes: send referral colonoscopy

## 2022-09-04 ENCOUNTER — Other Ambulatory Visit: Payer: Self-pay | Admitting: Family Medicine

## 2022-09-04 NOTE — Telephone Encounter (Signed)
OV 07/31/22  Requested Prescriptions  Pending Prescriptions Disp Refills   metoprolol succinate (TOPROL-XL) 25 MG 24 hr tablet [Pharmacy Med Name: METOPROLOL SUCC ER 25 MG TAB] 90 tablet 1    Sig: TAKE 1 TABLET BY MOUTH EVERY DAY     Cardiovascular:  Beta Blockers Failed - 09/04/2022  2:20 AM      Failed - Valid encounter within last 6 months    Recent Outpatient Visits           1 year ago General medical exam   [facility] Donita Brooks, MD   1 year ago Viral upper respiratory tract infection   [facility] Valentino Nose, NP   1 year ago Fatigue, unspecified type   [facility] Pickard, Priscille Heidelberg, MD   2 years ago Screen for colon cancer   [facility] Donita Brooks, MD   2 years ago Encounter for screening for lung cancer   [facility] Pickard, Priscille Heidelberg, MD              Passed - Last BP in normal range    BP Readings from Last 1 Encounters:  07/31/22 120/70         Passed - Last Heart Rate in normal range    Pulse Readings from Last 1 Encounters:  07/31/22 79

## 2022-09-08 ENCOUNTER — Other Ambulatory Visit: Payer: Medicare PPO

## 2022-09-08 ENCOUNTER — Telehealth: Payer: Self-pay

## 2022-09-08 DIAGNOSIS — E7800 Pure hypercholesterolemia, unspecified: Secondary | ICD-10-CM

## 2022-09-08 DIAGNOSIS — E119 Type 2 diabetes mellitus without complications: Secondary | ICD-10-CM | POA: Diagnosis not present

## 2022-09-08 MED ORDER — AMLODIPINE BESYLATE 10 MG PO TABS: 10.0000 mg | ORAL_TABLET | Freq: Every day | ORAL | 1 refills | Status: DC

## 2022-09-08 NOTE — Telephone Encounter (Signed)
Prescription Request  09/08/2022  LOV: 07/31/22 (LAST CPE 07/14/22)  What is the name of the medication or equipment? amLODipine (NORVASC) 10 MG tablet [[id number]]  Have you contacted your pharmacy to request a refill? Yes   Which pharmacy would you like this sent to?  [id number] [place], [place] - [address] [facility] [address] [place] [address] Phone: [phone] Fax: [phone]    Patient notified that their request is being sent to the clinical staff for review and that they should receive a response within 2 business days.   Please advise at [facility] [phone]

## 2022-09-09 LAB — LIPID PANEL
Cholesterol: 129 mg/dL (ref <200)
HDL: 43 mg/dL (ref >=40)
LDL Cholesterol (Calc): 66 mg/dL (calc)
Non-HDL Cholesterol (Calc): 86 mg/dL (calc) (ref <130)
Total CHOL/HDL Ratio: 3 (calc) (ref <5.0)
Triglycerides: 117 mg/dL (ref <150)

## 2022-09-09 LAB — HEMOGLOBIN A1C
Hgb A1c MFr Bld: 7.2 % of total Hgb — ABNORMAL HIGH (ref <5.7)
Mean Plasma Glucose: 160 mg/dL
eAG (mmol/L): 8.9 mmol/L

## 2022-09-25 DIAGNOSIS — I10 Essential (primary) hypertension: Secondary | ICD-10-CM | POA: Diagnosis not present

## 2022-09-25 DIAGNOSIS — E1165 Type 2 diabetes mellitus with hyperglycemia: Secondary | ICD-10-CM | POA: Diagnosis not present

## 2022-11-11 ENCOUNTER — Other Ambulatory Visit: Payer: Self-pay | Admitting: Pharmacist

## 2022-11-11 NOTE — Progress Notes (Signed)
11/11/2022 Name: Vincent Short MRN: [id number] DOB: February 01, 1951  Chief Complaint  Patient presents with   Diabetes    MARCELLO Short is a 72 y.o. year old male who presented for a telephone visit.   They were referred to the pharmacist by  self  for assistance in managing diabetes.    Subjective:  Care Team: Primary Care Provider: Donita Brooks, MD   Medication Access/Adherence  [facility]  [phone] [place] [place] - [address] [facility] [address] [place] [address] Phone: [phone] Fax: [phone]   Patient reports affordability concerns with their medications: Yes  Patient reports access/transportation concerns to their pharmacy: No  Patient reports adherence concerns with their medications:  No     Diabetes:  Current medications: Jardiance, metformin Medications tried in the past: n/a  Current glucose readings: n/a Using accuchek guide meter: patient has not been testing, but states he will start  Objective:  Lab Results  Component Value Date   HGBA1C 7.2 (H) 09/08/2022    Lab Results  Component Value Date   CREATININE 1.00 01/27/2022   BUN 17 01/27/2022   NA 140 01/27/2022   K 4.9 01/27/2022   CL 105 01/27/2022   CO2 28 01/27/2022    Lab Results  Component Value Date   CHOL 129 09/08/2022   HDL 43 09/08/2022   LDLCALC 66 09/08/2022   TRIG 117 09/08/2022   CHOLHDL 3.0 09/08/2022    Medications Reviewed Today     Reviewed by Arta Silence, CMA (Certified Medical Assistant) on 08/14/22 at 1424  Med List Status: <None>   Medication Order Taking? Sig Documenting Provider Last Dose Status Informant  ACCU-CHEK GUIDE test strip [id number] Yes USE AS DIRECTED DAILY Park Meo, FNP Taking Active   albuterol (VENTOLIN HFA) 108 (90 Base) MCG/ACT inhaler [id number] Yes Inhale 2 puffs into the lungs every 6 (six) hours as needed for wheezing or shortness of breath. Donita Brooks, MD  Taking Active   amLODipine (NORVASC) 10 MG tablet [id number] Yes TAKE 1 TABLET BY MOUTH EVERY DAY Donita Brooks, MD Taking Active   atorvastatin (LIPITOR) 80 MG tablet [id number] Yes Take 1 tablet (80 mg total) by mouth daily. Donita Brooks, MD Taking Active   azithromycin [facility]) 250 MG tablet [id number] Yes 2 tabs poqday1, 1 tab poqday 2-5 Donita Brooks, MD Taking Active   Blood Glucose Monitoring Suppl (BLOOD GLUCOSE SYSTEM PAK) KIT [id number] Yes Please dispense based on patient and insurance preference. Use as directed to monitor FSBS 1x daily. Dx: E11.9. Donita Brooks, MD Taking Active   budesonide-formoterol [facility]) 160-4.5 MCG/ACT inhaler [id number] Yes Inhale 2 puffs into the lungs 2 (two) times daily.   [provider] Taking Active   empagliflozin (JARDIANCE) 25 MG TABS tablet [id number] Yes Take 1 tablet (25 mg total) by mouth daily before breakfast. Donita Brooks, MD Taking Active   fluticasone [facility]) 50 MCG/ACT nasal spray [id number] Yes PLACE 2 SPRAYS INTO THE NOSTRIL(S) DAILY Donita Brooks, MD Taking Active   ipratropium (ATROVENT) 0.06 % nasal spray [id number] Yes Place 2 sprays into both nostrils 4 (four) times daily. Danelle Berry, PA-C Taking Active   Lancets MISC [id number] Yes Please dispense based on patient and insurance preference. Use as directed to monitor FSBS 1x daily. Dx: E11.9. Donita Brooks, MD Taking Active   levocetirizine (XYZAL) 5 MG tablet [id number] Yes TAKE 1 TABLET BY MOUTH EVERY  DAY IN THE [place], Priscille Heidelberg, MD Taking Active   losartan (COZAAR) 100 MG tablet [id number] Yes TAKE 1 TABLET(100 MG) BY MOUTH DAILY Donita Brooks, MD Taking Active   metFORMIN (GLUCOPHAGE-XR) 500 MG 24 hr tablet [id number] Yes TAKE 2 TABLETS BY MOUTH EVERY DAY WITH BREAKFAST Donita Brooks, MD Taking Active   metoprolol succinate (TOPROL-XL) 25 MG 24 hr tablet [id number] Yes TAKE 1 TABLET BY MOUTH EVERY DAY Donita Brooks, MD Taking  Active   montelukast (SINGULAIR) 10 MG tablet [id number] Yes Take 1 pill daily. Donita Brooks, MD Taking Active   Multiple Vitamin (MULTIVITAMIN) tablet [id number] Yes Take 1 tablet by mouth daily. [provider] Taking Active   niacin (NIASPAN) 1000 MG CR tablet [id number] Yes TAKE 1 TABLET (1,000 MG TOTAL) BY MOUTH AT BEDTIME. Donita Brooks, MD Taking Active   predniSONE (DELTASONE) 20 MG tablet [id number] Yes 3 tabs poqday 1-2, 2 tabs poqday 3-4, 1 tab poqday 5-6 Donita Brooks, MD Taking Active   triamcinolone cream (KENALOG) 0.1 % [id number] Yes APPLY TO AFFECTED AREA TWICE A DAY Donita Brooks, MD Taking Active               Assessment/Plan:   Diabetes: - Currently uncontrolled - Reviewed long term cardiovascular and renal outcomes of uncontrolled blood sugar - Reviewed goal A1c, goal fasting, and goal 2 hour post prandial glucose - Recommend to: Continue [employer] grant info Increase metformin to 2 tablets twice daily with breakfast and dinner per PCP notes (A1c increased at last PCP visit in May); patient has not yet increased; discussed starting with 1 additional tablet at dinner and working up to 2 tablets twice daily as tolerated  Will route message to PCP to change metformin RX to accommodate increased dose - Recommend to check glucose daily (fasting) or if symptomatic    Follow Up Plan: 4 weeks  Kieth Brightly, PharmD, BCACP Clinical Pharmacist, [facility]

## 2022-11-12 ENCOUNTER — Other Ambulatory Visit: Payer: Self-pay | Admitting: Family Medicine

## 2022-11-12 MED ORDER — METFORMIN HCL ER 500 MG PO TB24: 2000.0000 mg | ORAL_TABLET | Freq: Every day | ORAL | 3 refills | Status: DC

## 2022-11-13 ENCOUNTER — Encounter: Payer: Self-pay | Admitting: Family Medicine

## 2022-11-14 ENCOUNTER — Other Ambulatory Visit: Payer: Self-pay | Admitting: Family Medicine

## 2022-11-14 DIAGNOSIS — J329 Chronic sinusitis, unspecified: Secondary | ICD-10-CM

## 2022-11-16 NOTE — Telephone Encounter (Signed)
Requested Prescriptions  Pending Prescriptions Disp Refills   montelukast (SINGULAIR) 10 MG tablet [Pharmacy Med Name: MONTELUKAST SOD 10 MG TABLET] 90 tablet 0    Sig: TAKE 1 TABLET BY MOUTH EVERY DAY     Pulmonology:  Leukotriene Inhibitors Failed - 11/14/2022  9:12 AM      Failed - Valid encounter within last 12 months    Recent Outpatient Visits           1 year ago General medical exam   [facility] Donita Brooks, MD   1 year ago Viral upper respiratory tract infection   [facility] Valentino Nose, NP   1 year ago Fatigue, unspecified type   [facility] Donita Brooks, MD   2 years ago Screen for colon cancer   [facility] Donita Brooks, MD   2 years ago Encounter for screening for lung cancer   [facility] Pickard, Priscille Heidelberg, MD

## 2022-11-24 ENCOUNTER — Other Ambulatory Visit: Payer: Self-pay | Admitting: Family Medicine

## 2022-11-24 ENCOUNTER — Telehealth: Payer: Self-pay | Admitting: Family Medicine

## 2022-11-24 MED ORDER — NIRMATRELVIR/RITONAVIR (PAXLOVID)TABLET: 3.0000 | ORAL_TABLET | Freq: Two times a day (BID) | ORAL | 0 refills | Status: AC

## 2022-11-24 NOTE — Telephone Encounter (Signed)
Patient called to report a positive COVID test taken this morning about 30 minutes ago.   Sx began Sunday night - felt like allergies and progressed. Sx: cough, chest and nasal congestion, fever, body aches.  Requesting for provider to call in script for PAXLOVID.  Pharmacy confirmed as:  [id number] [place], [place] - [address] [facility] [address] [place] [address] Phone: [phone]  Fax: [phone] DEA #: [id number]   Please advise patient at [phone].

## 2022-11-26 ENCOUNTER — Other Ambulatory Visit: Payer: Self-pay | Admitting: Family Medicine

## 2022-11-27 NOTE — Telephone Encounter (Signed)
Requested medication (s) are due for refill today:   Yes  Requested medication (s) are on the active medication list:   Yes  Future visit scheduled:   No   LOV 07/31/2022   Last ordered: 01/05/2022  #90, 1 refill  Returned because labs are due per protocol   Requested Prescriptions  Pending Prescriptions Disp Refills   losartan (COZAAR) 100 MG tablet [Pharmacy Med Name: LOSARTAN 100MG  TABLETS] 90 tablet 1    Sig: TAKE 1 TABLET(100 MG) BY MOUTH DAILY     Cardiovascular:  Angiotensin Receptor Blockers Failed - 11/26/2022  3:00 PM      Failed - Cr in normal range and within 180 days    Creat  Date Value Ref Range Status  01/27/2022 1.00 0.70 - 1.28 mg/dL Final         Failed - K in normal range and within 180 days    Potassium  Date Value Ref Range Status  01/27/2022 4.9 3.5 - 5.3 mmol/L Final         Failed - Valid encounter within last 6 months    Recent Outpatient Visits           1 year ago General medical exam   [facility] Donita Brooks, MD   1 year ago Viral upper respiratory tract infection   [facility] Valentino Nose, NP   1 year ago Fatigue, unspecified type   [facility] Pickard, Priscille Heidelberg, MD   2 years ago Screen for colon cancer   [facility] Donita Brooks, MD   2 years ago Encounter for screening for lung cancer   [facility] Pickard, Priscille Heidelberg, MD              Passed - Patient is not pregnant      Passed - Last BP in normal range    BP Readings from Last 1 Encounters:  07/31/22 120/70

## 2022-12-01 ENCOUNTER — Other Ambulatory Visit: Payer: Self-pay | Admitting: Family Medicine

## 2022-12-01 MED ORDER — LOSARTAN POTASSIUM 100 MG PO TABS: 100.0000 mg | ORAL_TABLET | Freq: Every day | ORAL | 3 refills | Status: DC

## 2022-12-07 DIAGNOSIS — G4733 Obstructive sleep apnea (adult) (pediatric): Secondary | ICD-10-CM | POA: Diagnosis not present

## 2022-12-26 ENCOUNTER — Other Ambulatory Visit: Payer: Self-pay | Admitting: Family Medicine

## 2022-12-29 NOTE — Telephone Encounter (Signed)
Requested Prescriptions  Pending Prescriptions Disp Refills   atorvastatin (LIPITOR) 80 MG tablet [Pharmacy Med Name: ATORVASTATIN 80 MG TABLET] 90 tablet 0    Sig: TAKE 1 TABLET BY MOUTH EVERY DAY     Cardiovascular:  Antilipid - Statins Failed - 12/26/2022  8:28 AM      Failed - Valid encounter within last 12 months    Recent Outpatient Visits           1 year ago General medical exam   [facility] Donita Brooks, MD   1 year ago Viral upper respiratory tract infection   [facility] Medicine Valentino Nose, NP   1 year ago Fatigue, unspecified type   [facility] Pickard, Priscille Heidelberg, MD   2 years ago Screen for colon cancer   [facility] Donita Brooks, MD   2 years ago Encounter for screening for lung cancer   [facility] Medicine Donita Brooks, MD              Failed - Lipid Panel in normal range within the last 12 months    Cholesterol  Date Value Ref Range Status  09/08/2022 129 <200 mg/dL Final   LDL Cholesterol (Calc)  Date Value Ref Range Status  09/08/2022 66 mg/dL (calc) Final    Comment:    Reference range: <100 . Desirable range <100 mg/dL for primary prevention;   <70 mg/dL for patients with CHD or diabetic patients  with > or = 2 CHD risk factors. Marland Kitchen LDL-C is now calculated using the Martin-Hopkins  calculation, which is a validated novel method providing  better accuracy than the Friedewald equation in the  estimation of LDL-C.  Horald Pollen et al. Lenox Ahr. 1610;960(45): 2061-2068  (http://education.[facility].com/faq/FAQ164)    HDL  Date Value Ref Range Status  09/08/2022 43 > OR = 40 mg/dL Final   Triglycerides  Date Value Ref Range Status  09/08/2022 117 <150 mg/dL Final         Passed - Patient is not pregnant

## 2023-01-04 ENCOUNTER — Other Ambulatory Visit: Payer: Self-pay

## 2023-01-04 NOTE — Telephone Encounter (Signed)
Prescription Request  01/04/2023  LOV: 07/31/22  What is the name of the medication or equipment? empagliflozin (JARDIANCE) 25 MG TABS tablet [[id number]]  Have you contacted your pharmacy to request a refill? Yes   Which pharmacy would you like this sent to?  [facility] [address] [place], [place] [address], [phone]  Patient notified that their request is being sent to the clinical staff for review and that they should receive a response within 2 business days.   Please advise at [facility] [phone]

## 2023-01-05 NOTE — Telephone Encounter (Signed)
Requested medications are due for refill today.  A little soon  Requested medications are on the active medications list.  yes  Last refill. 01/27/2022 #90 3 rf  Future visit scheduled.   no  Notes to clinic.  Pt last seen 01/27/2022  - pt is due for CPE.    Requested Prescriptions  Pending Prescriptions Disp Refills   empagliflozin (JARDIANCE) 25 MG TABS tablet 90 tablet 3    Sig: Take 1 tablet (25 mg total) by mouth daily before breakfast.     Endocrinology:  Diabetes - SGLT2 Inhibitors Failed - 01/04/2023  1:37 PM      Failed - Valid encounter within last 6 months    Recent Outpatient Visits           1 year ago General medical exam   [facility] Donita Brooks, MD   1 year ago Viral upper respiratory tract infection   [facility] Medicine Valentino Nose, NP   1 year ago Fatigue, unspecified type   [facility] Donita Brooks, MD   2 years ago Screen for colon cancer   [facility] Donita Brooks, MD   2 years ago Encounter for screening for lung cancer   [facility] Pickard, Priscille Heidelberg, MD       Future Appointments             In 2 days Tanya Nones, Priscille Heidelberg, MD [facility]            Passed - Cr in normal range and within 360 days    Creat  Date Value Ref Range Status  01/27/2022 1.00 0.70 - 1.28 mg/dL Final         Passed - HBA1C is between 0 and 7.9 and within 180 days    Hgb A1c MFr Bld  Date Value Ref Range Status  09/08/2022 7.2 (H) <5.7 % of total Hgb Final    Comment:    For someone without known diabetes, a hemoglobin A1c value of 6.5% or greater indicates that they may have  diabetes and this should be confirmed with a follow-up  test. . For someone with known diabetes, a value <7% indicates  that their diabetes is well controlled and a value  greater than or equal to 7% indicates suboptimal  control. A1c targets should be  individualized based on  duration of diabetes, age, comorbid conditions, and  other considerations. . Currently, no consensus exists regarding use of hemoglobin A1c for diagnosis of diabetes for children. .          Passed - eGFR in normal range and within 360 days    GFR, Est African American  Date Value Ref Range Status  01/25/2020 106 > OR = 60 mL/min/1.39m2 Final   GFR, Est Non African American  Date Value Ref Range Status  01/25/2020 91 > OR = 60 mL/min/1.4m2 Final   eGFR  Date Value Ref Range Status  01/27/2022 80 > OR = 60 mL/min/1.22m2 Final

## 2023-01-06 NOTE — Telephone Encounter (Signed)
Pharmacy requesting 90 day script of empagliflozin (JARDIANCE) 25 MG TABS tablet   ** LOV 07/31/2022**  Pharmacy:    Please advise pharmacist.

## 2023-01-07 ENCOUNTER — Ambulatory Visit (INDEPENDENT_AMBULATORY_CARE_PROVIDER_SITE_OTHER): Payer: Medicare PPO | Admitting: Family Medicine

## 2023-01-07 VITALS — BP 102/70 | HR 83 | Temp 97.9°F | Ht 72.0 in | Wt 190.2 lb

## 2023-01-07 DIAGNOSIS — M151 Heberden's nodes (with arthropathy): Secondary | ICD-10-CM | POA: Diagnosis not present

## 2023-01-07 DIAGNOSIS — Z125 Encounter for screening for malignant neoplasm of prostate: Secondary | ICD-10-CM

## 2023-01-07 MED ORDER — EMPAGLIFLOZIN 25 MG PO TABS: 25.0000 mg | ORAL_TABLET | Freq: Every day | ORAL | 3 refills | Status: DC

## 2023-01-07 NOTE — Progress Notes (Signed)
Subjective:    Patient ID: Vincent Short, male    DOB: 1951-01-26, 72 y.o.   [name]  Patient has nodules on his DIP joints right hand greater than left hand.  These are classic appearance for [place] nodules.  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  There is no warmth.  There is no effusion.  There is no swelling or redness.  There is no evidence of rheumatoid arthritis.  He denies any pain in his hands.  He is just concerned by the appearance Past Medical History:  Diagnosis Date   Allergic rhinitis    Asthma    Atelectasis    Rt upper lobe   COPD (chronic obstructive pulmonary disease) (HCC)    GERD (gastroesophageal reflux disease)    History of colon polyps 03/14/2009   Tubular adenomatous and Hyperplastic polyps   HTN (hypertension)    Hyperlipidemia    Lung mass    noted on CT scan(12/24/10)    S/P cardiac cath    negative   Tobacco abuse    Treadmill stress test negative for angina pectoris    Dr. Tresa Endo   Past Surgical History:  Procedure Laterality Date   Arthroscopy left wrist with radial styloidectomy.  07/06/2000   Dr Merlyn Lot   Bronchoscopy with airway inspection, brush cytology,  11/22/2006   Dr Craige Cotta   CARDIAC CATHETERIZATION  07/05/2002   Dr Martyn Ehrich LVF, SMOOTH 10% TO 20% OSTIAL AND 10%-20% LUMINAL IRREGULARITY OF THE PROXIMAL LAD, SMOOTH 30%-40% PROXIMAL RCA,WHICH IMPROVED FOLLOWING INTRACORONARY NTG ADMINISTRATION SUGGESTING CORONARY VASOSPASM. NO AORTOILIAC DISEASE.   DOPPLER ECHOCARDIOGRAPHY N/A 06/22/08   LV SIZE IS NORMAL. LV FUNCTION IS NORMAL. EF=>55%. THERE IS BORDERLINE CONCENTRIC LVH.   FRACTURE SURGERY     Left wrist scaphoid fx   FRACTURE SURGERY     Left ankle surgery   Left ankle surgery  11/11/2005   Dr Thurston Hole   LUNG SURGERY  01/21/11   Dr. Laneta Simmers   FOB, R THORACOTOMY WITH RIGHT UPPER LOBECTOMY   LUNG  SURGERY  01/21/11   FOB, R THORACOTOMY WITH RIGHT UPPER LOBECTOMY   NASAL SINUS SURGERY  1990   NUCLEAR STRESS TEST N/A 09/09/10   NORMAL PATTERN OF PERFUSION IN ALL REGIONS. EF IS 63%. NO WALL MOTION ABNORMALITIES.   ROTATOR CUFF REPAIR  pending   Dr. Thurston Hole   TONSILLECTOMY     Current Outpatient Medications on File Prior to Visit  Medication Sig Dispense Refill   ACCU-CHEK GUIDE test strip USE AS DIRECTED DAILY 100 strip 11   albuterol (VENTOLIN HFA) 108 (90 Base) MCG/ACT inhaler Inhale 2 puffs into the lungs every 6 (six) hours as needed for wheezing or shortness of breath. 1 each 5   amLODipine (NORVASC) 10 MG tablet Take 1 tablet (10 mg total) by mouth daily. 90 tablet 1   atorvastatin (LIPITOR) 80 MG tablet TAKE 1 TABLET BY MOUTH EVERY DAY 90 tablet 0   azithromycin (ZITHROMAX) 250 MG tablet 2 tabs poqday1, 1 tab poqday 2-5 6 tablet 0   Blood Glucose Monitoring Suppl (BLOOD GLUCOSE SYSTEM PAK) KIT Please dispense based on patient and insurance preference. Use as directed to monitor FSBS 1x daily. Dx: E11.9. 1 kit 1   budesonide-formoterol (SYMBICORT) 160-4.5 MCG/ACT inhaler Inhale 2 puffs into the lungs 2 (two) times daily.       empagliflozin (  JARDIANCE) 25 MG TABS tablet Take 1 tablet (25 mg total) by mouth daily before breakfast. 90 tablet 3   fluticasone (FLONASE) 50 MCG/ACT nasal spray PLACE 2 SPRAYS INTO THE NOSTRIL(S) DAILY 48 mL 3   ipratropium (ATROVENT) 0.06 % nasal spray Place 2 sprays into both nostrils 4 (four) times daily. 15 mL 12   Lancets MISC Please dispense based on patient and insurance preference. Use as directed to monitor FSBS 1x daily. Dx: E11.9. 100 each 1   levocetirizine (XYZAL) 5 MG tablet TAKE 1 TABLET BY MOUTH EVERY DAY IN THE EVENING 90 tablet 3   losartan (COZAAR) 100 MG tablet Take 1 tablet (100 mg total) by mouth daily. 90 tablet 3   metFORMIN (GLUCOPHAGE-XR) 500 MG 24 hr tablet Take 4 tablets (2,000 mg total) by mouth daily with breakfast. 360 tablet 1    metoprolol succinate (TOPROL-XL) 25 MG 24 hr tablet TAKE 1 TABLET BY MOUTH EVERY DAY 90 tablet 1   montelukast (SINGULAIR) 10 MG tablet TAKE 1 TABLET BY MOUTH EVERY DAY 90 tablet 0   Multiple Vitamin (MULTIVITAMIN) tablet Take 1 tablet by mouth daily.     niacin (NIASPAN) 1000 MG CR tablet TAKE 1 TABLET (1,000 MG TOTAL) BY MOUTH AT BEDTIME. 30 tablet 5   predniSONE (DELTASONE) 20 MG tablet 3 tabs poqday 1-2, 2 tabs poqday 3-4, 1 tab poqday 5-6 12 tablet 0   triamcinolone cream (KENALOG) 0.1 % APPLY TO AFFECTED AREA TWICE A DAY 30 g 0   No current facility-administered medications on file prior to visit.   Allergies  Allergen Reactions   Contrast Media [Iodinated Contrast Media] Swelling    Allergic reaction 1987, ok w/ 13 hr prep//a.c.   Social History   Socioeconomic History   Marital status: Married    Spouse name: Not on file   Number of children: Not on file   Years of education: Not on file   Highest education level: Not on file  Occupational History   Occupation: heating and AC    Employer: [employer]  Tobacco Use   Smoking status: Former    Current packs/day: 0.00    Types: Cigarettes    Quit date: 02/26/2020    Years since quitting: 2.8   Smokeless tobacco: Never  Substance and Sexual Activity   Alcohol use: Yes   Drug use: No   Sexual activity: Not on file  Other Topics Concern   Not on file  Social History Narrative    He is married.  He works in Production assistant, radio.  He smoked 1 pack of cigarettes a day for the last 35     years.  He has occasional alcohol use.    Social Determinants of Health   Financial Resource Strain: Low Risk  (08/14/2022)   Overall Financial Resource Strain (CARDIA)    Difficulty of Paying Living Expenses: Not very hard  Food Insecurity: No Food Insecurity (08/14/2022)   Hunger Vital Sign    Worried About Running Out of Food in the Last Year: Never true    Ran Out of Food in the Last Year: Never true   Transportation Needs: No Transportation Needs (08/14/2022)   PRAPARE - Administrator, Civil Service (Medical): No    Lack of Transportation (Non-Medical): No  Physical Activity: Insufficiently Active (08/14/2022)   Exercise Vital Sign    Days of Exercise per Week: 3 days    Minutes of Exercise per Session: 10  min  Stress: No Stress Concern Present (08/14/2022)   [employer] of Occupational Health - Occupational Stress Questionnaire    Feeling of Stress : Not at all  Social Connections: Moderately Isolated (08/14/2022)   Social Connection and Isolation Panel [NHANES]    Frequency of Communication with Friends and Family: Once a week    Frequency of Social Gatherings with Friends and Family: Never    Attends Religious Services: Never    Database administrator or Organizations: Yes    Attends Banker Meetings: Never    Marital Status: Married  Catering manager Violence: Not At Risk (08/14/2022)   Humiliation, Afraid, Rape, and Kick questionnaire    Fear of Current or Ex-Partner: No    Emotionally Abused: No    Physically Abused: No    Sexually Abused: No     Review of Systems  Respiratory:  Positive for cough.   All other systems reviewed and are negative.      Objective:   Physical Exam Vitals reviewed.  Constitutional:      Appearance: Normal appearance. He is normal weight.  Neck:     Thyroid: No thyroid mass, thyromegaly or thyroid tenderness.     Vascular: No carotid bruit or JVD.  Cardiovascular:     Rate and Rhythm: Normal rate.     Heart sounds: Normal heart sounds. No murmur heard.    No friction rub. No gallop.  Pulmonary:     Effort: Pulmonary effort is normal. No respiratory distress.     Breath sounds: No wheezing, rhonchi or rales.  Abdominal:     General: Bowel sounds are normal. There is no distension.     Palpations: Abdomen is soft.     Tenderness: There is no abdominal tenderness. There is no guarding or rebound.   Musculoskeletal:     Right hand: Deformity present. No swelling, tenderness or bony tenderness. Decreased range of motion. Normal strength.     Left hand: Deformity present. No swelling, tenderness or bony tenderness. Decreased range of motion. Normal strength.     Cervical back: Neck supple.     Right lower leg: No edema.     Left lower leg: No edema.  Lymphadenopathy:     Cervical: No cervical adenopathy.  Skin:    Coloration: Skin is not jaundiced.     Findings: No bruising, erythema, lesion or rash.  Neurological:     General: No focal deficit present.     Mental Status: He is alert and oriented to person, place, and time. Mental status is at baseline.     Cranial Nerves: No cranial nerve deficit.     Motor: No weakness.     Gait: Gait normal.  Psychiatric:        Mood and Affect: Mood normal.        Behavior: Behavior normal.        Thought Content: Thought content normal.        Judgment: Judgment normal.           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  I explained to the patient that rheumatology is not necessary to treat this condition.  Patient does not require immunosuppression and at the present time is pain-free.  Therefore I will treat this is osteoarthritis.  At present he is pain-free.  So we will not start  any medication.  I will check a PSA to screen for prostate cancer.

## 2023-01-08 LAB — PSA: PSA: 0.3 ng/mL (ref <=4.00)

## 2023-01-13 DIAGNOSIS — G4733 Obstructive sleep apnea (adult) (pediatric): Secondary | ICD-10-CM | POA: Diagnosis not present

## 2023-01-16 DIAGNOSIS — G4733 Obstructive sleep apnea (adult) (pediatric): Secondary | ICD-10-CM | POA: Diagnosis not present

## 2023-02-11 ENCOUNTER — Telehealth: Payer: Self-pay

## 2023-02-11 NOTE — Telephone Encounter (Signed)
Prescription Request  02/11/2023  LOV: 01/07/23  What is the name of the medication or equipment? metFORMIN (GLUCOPHAGE-XR) 500 MG 24 hr tablet [[id number]]  Have you contacted your pharmacy to request a refill? Yes   Which pharmacy would you like this sent to?  [id number] [place], [place] - [address] [facility] [address] [place] [address] Phone: [phone] Fax: [phone]    Patient notified that their request is being sent to the clinical staff for review and that they should receive a response within 2 business days.   Please advise at [facility] [phone]

## 2023-02-12 ENCOUNTER — Other Ambulatory Visit: Payer: Self-pay | Admitting: Family Medicine

## 2023-02-12 DIAGNOSIS — G4733 Obstructive sleep apnea (adult) (pediatric): Secondary | ICD-10-CM | POA: Diagnosis not present

## 2023-02-12 DIAGNOSIS — J329 Chronic sinusitis, unspecified: Secondary | ICD-10-CM

## 2023-02-12 NOTE — Telephone Encounter (Signed)
Requested Prescriptions  Pending Prescriptions Disp Refills   montelukast (SINGULAIR) 10 MG tablet [Pharmacy Med Name: MONTELUKAST SOD 10 MG TABLET] 90 tablet 0    Sig: TAKE 1 TABLET BY MOUTH EVERY DAY     Pulmonology:  Leukotriene Inhibitors Failed - 02/12/2023  2:43 AM      Failed - Valid encounter within last 12 months    Recent Outpatient Visits           1 year ago General medical exam   [facility] Donita Brooks, MD   1 year ago Viral upper respiratory tract infection   [facility] Valentino Nose, NP   2 years ago Fatigue, unspecified type   [facility] Medicine Donita Brooks, MD   2 years ago Screen for colon cancer   [facility] Donita Brooks, MD   3 years ago Encounter for screening for lung cancer   [facility] Medicine Pickard, Priscille Heidelberg, MD

## 2023-02-13 ENCOUNTER — Other Ambulatory Visit: Payer: Self-pay | Admitting: Family Medicine

## 2023-02-15 NOTE — Telephone Encounter (Signed)
Due to a system glitch the last office visit for this practice is not detected correctly.    LOV 01/07/2023.  Requested Prescriptions  Pending Prescriptions Disp Refills   amLODipine (NORVASC) 10 MG tablet [Pharmacy Med Name: AMLODIPINE BESYLATE 10 MG TAB] 90 tablet 1    Sig: TAKE 1 TABLET BY MOUTH EVERY DAY     Cardiovascular: Calcium Channel Blockers 2 Failed - 02/15/2023  1:05 PM      Failed - Valid encounter within last 6 months    Recent Outpatient Visits           1 year ago General medical exam   [facility] Donita Brooks, MD   1 year ago Viral upper respiratory tract infection   [facility] Medicine Valentino Nose, NP   2 years ago Fatigue, unspecified type   [facility] Medicine Pickard, Priscille Heidelberg, MD   2 years ago Screen for colon cancer   [facility] Donita Brooks, MD   3 years ago Encounter for screening for lung cancer   [facility] Medicine Pickard, Priscille Heidelberg, MD              Passed - Last BP in normal range    BP Readings from Last 1 Encounters:  01/07/23 102/70         Passed - Last Heart Rate in normal range    Pulse Readings from Last 1 Encounters:  01/07/23 83          metoprolol succinate (TOPROL-XL) 25 MG 24 hr tablet [Pharmacy Med Name: METOPROLOL SUCC ER 25 MG TAB] 90 tablet 1    Sig: TAKE 1 TABLET BY MOUTH EVERY DAY     Cardiovascular:  Beta Blockers Failed - 02/15/2023  1:05 PM      Failed - Valid encounter within last 6 months    Recent Outpatient Visits           1 year ago General medical exam   [facility] Donita Brooks, MD   1 year ago Viral upper respiratory tract infection   [facility] Medicine Valentino Nose, NP   2 years ago Fatigue, unspecified type   [facility] Medicine Pickard, Priscille Heidelberg, MD   2 years ago Screen for colon cancer   [facility] Donita Brooks, MD   3 years ago Encounter for  screening for lung cancer   [facility] Pickard, Priscille Heidelberg, MD              Passed - Last BP in normal range    BP Readings from Last 1 Encounters:  01/07/23 102/70         Passed - Last Heart Rate in normal range    Pulse Readings from Last 1 Encounters:  01/07/23 83

## 2023-02-15 NOTE — Telephone Encounter (Signed)
Prescription Request  02/15/2023  LOV: 01/07/2023  What is the name of the medication or equipment? metFORMIN (GLUCOPHAGE-XR) 500 MG 24 hr tablet [[id number]]  Have you contacted your pharmacy to request a refill? Yes   Which pharmacy would you like this sent to?  [id number] [place], [place] - [address] [facility] [address] [place] [address] Phone: [phone] Fax: [phone]    Patient notified that their request is being sent to the clinical staff for review and that they should receive a response within 2 business days.   Please advise at [facility] [phone]

## 2023-03-15 DIAGNOSIS — G4733 Obstructive sleep apnea (adult) (pediatric): Secondary | ICD-10-CM | POA: Diagnosis not present

## 2023-03-19 ENCOUNTER — Telehealth: Payer: Self-pay

## 2023-03-19 NOTE — Patient Outreach (Signed)
Attempted to contact patient regarding care gaps. Left voicemail for patient to return my call at [phone].  Nicholes Rough, CMA Care Guide [employer]

## 2023-03-27 ENCOUNTER — Other Ambulatory Visit: Payer: Self-pay | Admitting: Family Medicine

## 2023-03-30 NOTE — Telephone Encounter (Signed)
Requested Prescriptions  Pending Prescriptions Disp Refills   atorvastatin (LIPITOR) 80 MG tablet [Pharmacy Med Name: ATORVASTATIN 80 MG TABLET] 90 tablet 1    Sig: TAKE 1 TABLET BY MOUTH EVERY DAY     Cardiovascular:  Antilipid - Statins Failed - 03/27/2023  8:26 AM      Failed - Valid encounter within last 12 months    Recent Outpatient Visits           1 year ago General medical exam   [facility] Donita Brooks, MD   2 years ago Viral upper respiratory tract infection   [facility] Medicine Valentino Nose, NP   2 years ago Fatigue, unspecified type   [facility] Medicine Pickard, Priscille Heidelberg, MD   3 years ago Screen for colon cancer   [facility] Medicine Donita Brooks, MD   3 years ago Encounter for screening for lung cancer   [facility] Medicine Donita Brooks, MD              Failed - Lipid Panel in normal range within the last 12 months    Cholesterol  Date Value Ref Range Status  09/08/2022 129 <200 mg/dL Final   LDL Cholesterol (Calc)  Date Value Ref Range Status  09/08/2022 66 mg/dL (calc) Final    Comment:    Reference range: <100 . Desirable range <100 mg/dL for primary prevention;   <70 mg/dL for patients with CHD or diabetic patients  with > or = 2 CHD risk factors. Marland Kitchen LDL-C is now calculated using the Martin-Hopkins  calculation, which is a validated novel method providing  better accuracy than the Friedewald equation in the  estimation of LDL-C.  Horald Pollen et al. Lenox Ahr. 5409;811(91): 2061-2068  (http://education.[facility].com/faq/FAQ164)    HDL  Date Value Ref Range Status  09/08/2022 43 > OR = 40 mg/dL Final   Triglycerides  Date Value Ref Range Status  09/08/2022 117 <150 mg/dL Final         Passed - Patient is not pregnant

## 2023-04-09 DIAGNOSIS — G4733 Obstructive sleep apnea (adult) (pediatric): Secondary | ICD-10-CM | POA: Diagnosis not present

## 2023-04-14 DIAGNOSIS — G4733 Obstructive sleep apnea (adult) (pediatric): Secondary | ICD-10-CM | POA: Diagnosis not present

## 2023-04-20 DIAGNOSIS — G4733 Obstructive sleep apnea (adult) (pediatric): Secondary | ICD-10-CM | POA: Diagnosis not present

## 2023-05-12 ENCOUNTER — Other Ambulatory Visit: Payer: Self-pay | Admitting: Family Medicine

## 2023-05-12 DIAGNOSIS — J329 Chronic sinusitis, unspecified: Secondary | ICD-10-CM

## 2023-05-13 ENCOUNTER — Other Ambulatory Visit: Payer: Self-pay | Admitting: Family Medicine

## 2023-05-13 NOTE — Telephone Encounter (Signed)
Prescription Request  05/13/2023  LOV: 01/07/2023  What is the name of the medication or equipment?   metFORMIN (GLUCOPHAGE-XR) 500 MG 24 hr tablet SIG: TAKE 4 TABLETS BY MOUTH DAILY WITH BREAKFAST **90 DAY SCRIPT REQUESTED**  Have you contacted your pharmacy to request a refill? Yes   Which pharmacy would you like this sent to?  [id number] [place], [place] - [address] [facility] [address] [place] [address] Phone: [phone] Fax: [phone]    Patient notified that their request is being sent to the clinical staff for review and that they should receive a response within 2 business days.   Please advise pharmacist

## 2023-05-14 MED ORDER — METFORMIN HCL ER 500 MG PO TB24: 2000.0000 mg | ORAL_TABLET | Freq: Every day | ORAL | 0 refills | Status: DC

## 2023-05-14 NOTE — Telephone Encounter (Signed)
Requested Prescriptions  Pending Prescriptions Disp Refills   metFORMIN (GLUCOPHAGE-XR) 500 MG 24 hr tablet 360 tablet 0    Sig: Take 4 tablets (2,000 mg total) by mouth daily with breakfast.     Endocrinology:  Diabetes - Biguanides Failed - 05/14/2023  9:36 AM      Failed - Cr in normal range and within 360 days    Creat  Date Value Ref Range Status  01/27/2022 1.00 0.70 - 1.28 mg/dL Final         Failed - HBA1C is between 0 and 7.9 and within 180 days    Hgb A1c MFr Bld  Date Value Ref Range Status  09/08/2022 7.2 (H) <5.7 % of total Hgb Final    Comment:    For someone without known diabetes, a hemoglobin A1c value of 6.5% or greater indicates that they may have  diabetes and this should be confirmed with a follow-up  test. . For someone with known diabetes, a value <7% indicates  that their diabetes is well controlled and a value  greater than or equal to 7% indicates suboptimal  control. A1c targets should be individualized based on  duration of diabetes, age, comorbid conditions, and  other considerations. . Currently, no consensus exists regarding use of hemoglobin A1c for diagnosis of diabetes for children. .          Failed - eGFR in normal range and within 360 days    GFR, Est African American  Date Value Ref Range Status  01/25/2020 106 > OR = 60 mL/min/1.48m2 Final   GFR, Est Non African American  Date Value Ref Range Status  01/25/2020 91 > OR = 60 mL/min/1.30m2 Final   eGFR  Date Value Ref Range Status  01/27/2022 80 > OR = 60 mL/min/1.3m2 Final         Failed - B12 Level in normal range and within 720 days    Vitamin B-12  Date Value Ref Range Status  02/10/2021 666 200 - 1,100 pg/mL Final         Failed - Valid encounter within last 6 months    Recent Outpatient Visits           1 year ago General medical exam   [facility] Donita Brooks, MD   2 years ago Viral upper respiratory tract infection   [facility] Valentino Nose, NP   2 years ago Fatigue, unspecified type   [facility] Pickard, Priscille Heidelberg, MD   3 years ago Screen for colon cancer   [facility] Pickard, Priscille Heidelberg, MD   3 years ago Encounter for screening for lung cancer   [facility] Pickard, Priscille Heidelberg, MD              Failed - CBC within normal limits and completed in the last 12 months    WBC  Date Value Ref Range Status  01/27/2022 8.9 3.8 - 10.8 Thousand/uL Final   RBC  Date Value Ref Range Status  01/27/2022 4.92 4.20 - 5.80 Million/uL Final   Hemoglobin  Date Value Ref Range Status  01/27/2022 15.4 13.2 - 17.1 g/dL Final   HCT  Date Value Ref Range Status  01/27/2022 44.9 38.5 - 50.0 % Final   MCHC  Date Value Ref Range Status  01/27/2022 34.3 32.0 - 36.0 g/dL Final   [facility]  Date Value Ref Range Status  01/27/2022 31.3 27.0 -  33.0 pg Final   MCV  Date Value Ref Range Status  01/27/2022 91.3 80.0 - 100.0 fL Final   No results found for: "PLTCOUNTKUC", "LABPLAT", "POCPLA" RDW  Date Value Ref Range Status  01/27/2022 12.3 11.0 - 15.0 % Final

## 2023-05-15 DIAGNOSIS — G4733 Obstructive sleep apnea (adult) (pediatric): Secondary | ICD-10-CM | POA: Diagnosis not present

## 2023-06-15 DIAGNOSIS — G4733 Obstructive sleep apnea (adult) (pediatric): Secondary | ICD-10-CM | POA: Diagnosis not present

## 2023-06-16 ENCOUNTER — Other Ambulatory Visit: Payer: Self-pay | Admitting: Family Medicine

## 2023-06-25 ENCOUNTER — Other Ambulatory Visit: Payer: Self-pay | Admitting: Family Medicine

## 2023-07-13 DIAGNOSIS — G4733 Obstructive sleep apnea (adult) (pediatric): Secondary | ICD-10-CM | POA: Diagnosis not present

## 2023-08-02 ENCOUNTER — Other Ambulatory Visit: Payer: Self-pay

## 2023-08-02 MED ORDER — LOSARTAN POTASSIUM 100 MG PO TABS: 100.0000 mg | ORAL_TABLET | Freq: Every day | ORAL | 3 refills | Status: AC

## 2023-08-07 ENCOUNTER — Other Ambulatory Visit: Payer: Self-pay | Admitting: Family Medicine

## 2023-08-07 DIAGNOSIS — J329 Chronic sinusitis, unspecified: Secondary | ICD-10-CM

## 2023-08-13 DIAGNOSIS — G4733 Obstructive sleep apnea (adult) (pediatric): Secondary | ICD-10-CM | POA: Diagnosis not present

## 2023-08-19 ENCOUNTER — Other Ambulatory Visit: Payer: Self-pay

## 2023-08-19 DIAGNOSIS — J441 Chronic obstructive pulmonary disease with (acute) exacerbation: Secondary | ICD-10-CM

## 2023-08-19 MED ORDER — ALBUTEROL SULFATE HFA 108 (90 BASE) MCG/ACT IN AERS: 2.0000 | INHALATION_SPRAY | Freq: Four times a day (QID) | RESPIRATORY_TRACT | 5 refills | Status: AC | PRN

## 2023-08-30 ENCOUNTER — Other Ambulatory Visit: Payer: Self-pay | Admitting: Family Medicine

## 2023-08-30 NOTE — Telephone Encounter (Signed)
 Prescription Request  08/30/2023  LOV: 01/07/2023  What is the name of the medication or equipment? metoprolol  succinate (TOPROL -XL) 25 MG 24 hr tablet   Have you contacted your pharmacy to request a refill? Yes   Which pharmacy would you like this sent to?  [id number] [place], [place] - [address] [facility] [address] [place] [address] Phone: [phone] Fax: [phone]    Patient notified that their request is being sent to the clinical staff for review and that they should receive a response within 2 business days.   Please advise at [facility] [phone]

## 2023-08-30 NOTE — Telephone Encounter (Signed)
 Prescription Request  08/30/2023  LOV: 01/07/2023  What is the name of the medication or equipment? amLODipine  (NORVASC ) 10 MG tablet   Have you contacted your pharmacy to request a refill? Yes   Which pharmacy would you like this sent to?  [id number] [place], [place] - [address] [facility] [address] [place] [address] Phone: [phone] Fax: [phone]    Patient notified that their request is being sent to the clinical staff for review and that they should receive a response within 2 business days.   Please advise at [facility] [phone]

## 2023-09-01 MED ORDER — METOPROLOL SUCCINATE ER 25 MG PO TB24: 25.0000 mg | ORAL_TABLET | Freq: Every day | ORAL | 0 refills | Status: DC

## 2023-09-01 MED ORDER — AMLODIPINE BESYLATE 10 MG PO TABS: 10.0000 mg | ORAL_TABLET | Freq: Every day | ORAL | 0 refills | Status: DC

## 2023-09-01 NOTE — Telephone Encounter (Signed)
 OFFICE VISIT NEEDED FOR ADDITIONAL REFILLS.  Requested Prescriptions  Pending Prescriptions Disp Refills   amLODipine  (NORVASC ) 10 MG tablet 90 tablet 0    Sig: Take 1 tablet (10 mg total) by mouth daily.     Cardiovascular: Calcium  Channel Blockers 2 Failed - 09/01/2023  9:05 AM      Failed - Valid encounter within last 6 months    Recent Outpatient Visits           7 months ago Prostate cancer screening   [facility] Pickard, Cisco Crest, MD   1 year ago COPD exacerbation [facility])   [facility] Austine Lefort, MD   1 year ago Diabetes mellitus without complication [facility])    [facility] Pickard, Cisco Crest, MD              Passed - Last BP in normal range    BP Readings from Last 1 Encounters:  01/07/23 102/70         Passed - Last Heart Rate in normal range    Pulse Readings from Last 1 Encounters:  01/07/23 83

## 2023-09-01 NOTE — Telephone Encounter (Signed)
 OFFICE VISIT NEEDED FOR ADDITIONAL REFILLS.  Requested Prescriptions  Pending Prescriptions Disp Refills   metoprolol  succinate (TOPROL -XL) 25 MG 24 hr tablet 90 tablet 0    Sig: Take 1 tablet (25 mg total) by mouth daily.     Cardiovascular:  Beta Blockers Failed - 09/01/2023  9:21 AM      Failed - Valid encounter within last 6 months    Recent Outpatient Visits           7 months ago Prostate cancer screening   [facility] Pickard, Cisco Crest, MD   1 year ago COPD exacerbation [facility])   [facility] Austine Lefort, MD   1 year ago Diabetes mellitus without complication [facility])   [place] [facility] Pickard, Cisco Crest, MD              Passed - Last BP in normal range    BP Readings from Last 1 Encounters:  01/07/23 102/70         Passed - Last Heart Rate in normal range    Pulse Readings from Last 1 Encounters:  01/07/23 83

## 2023-09-12 DIAGNOSIS — G4733 Obstructive sleep apnea (adult) (pediatric): Secondary | ICD-10-CM | POA: Diagnosis not present

## 2023-09-23 ENCOUNTER — Other Ambulatory Visit: Payer: Self-pay | Admitting: Family Medicine

## 2023-09-29 ENCOUNTER — Ambulatory Visit

## 2023-10-13 DIAGNOSIS — G4733 Obstructive sleep apnea (adult) (pediatric): Secondary | ICD-10-CM | POA: Diagnosis not present

## 2023-10-21 DIAGNOSIS — M25562 Pain in left knee: Secondary | ICD-10-CM | POA: Insufficient documentation

## 2023-10-21 DIAGNOSIS — M170 Bilateral primary osteoarthritis of knee: Secondary | ICD-10-CM | POA: Diagnosis not present

## 2023-10-22 DIAGNOSIS — M179 Osteoarthritis of knee, unspecified: Secondary | ICD-10-CM | POA: Insufficient documentation

## 2023-10-22 DIAGNOSIS — M1712 Unilateral primary osteoarthritis, left knee: Secondary | ICD-10-CM | POA: Insufficient documentation

## 2023-10-28 DIAGNOSIS — H35371 Puckering of macula, right eye: Secondary | ICD-10-CM | POA: Diagnosis not present

## 2023-10-28 DIAGNOSIS — H25813 Combined forms of age-related cataract, bilateral: Secondary | ICD-10-CM | POA: Diagnosis not present

## 2023-10-28 DIAGNOSIS — D3132 Benign neoplasm of left choroid: Secondary | ICD-10-CM | POA: Diagnosis not present

## 2023-10-28 LAB — HM DIABETES EYE EXAM

## 2023-11-02 ENCOUNTER — Telehealth: Payer: Self-pay | Admitting: Family Medicine

## 2023-11-02 ENCOUNTER — Other Ambulatory Visit: Payer: Self-pay

## 2023-11-02 DIAGNOSIS — E7800 Pure hypercholesterolemia, unspecified: Secondary | ICD-10-CM

## 2023-11-02 DIAGNOSIS — E119 Type 2 diabetes mellitus without complications: Secondary | ICD-10-CM

## 2023-11-02 MED ORDER — METFORMIN HCL ER 500 MG PO TB24: 2000.0000 mg | ORAL_TABLET | Freq: Every day | ORAL | 0 refills | Status: DC

## 2023-11-02 NOTE — Telephone Encounter (Signed)
 Prescription Request  11/02/2023  LOV: 01/07/2023  What is the name of the medication or equipment? metFORMIN  (GLUCOPHAGE -XR) 500 MG 24 hr tablet   Have you contacted your pharmacy to request a refill? Yes   Which pharmacy would you like this sent to?  [id number] [place], [place] - [address] [facility] [address] [place] [address] Phone: [phone] Fax: [phone]    Patient notified that their request is being sent to the clinical staff for review and that they should receive a response within 2 business days.   Please advise at [facility] [phone]

## 2023-11-04 ENCOUNTER — Other Ambulatory Visit: Payer: Self-pay | Admitting: Family Medicine

## 2023-11-04 DIAGNOSIS — J329 Chronic sinusitis, unspecified: Secondary | ICD-10-CM

## 2023-11-12 DIAGNOSIS — L820 Inflamed seborrheic keratosis: Secondary | ICD-10-CM | POA: Diagnosis not present

## 2023-11-12 DIAGNOSIS — G4733 Obstructive sleep apnea (adult) (pediatric): Secondary | ICD-10-CM | POA: Diagnosis not present

## 2023-11-27 ENCOUNTER — Other Ambulatory Visit: Payer: Self-pay | Admitting: Family Medicine

## 2023-12-07 ENCOUNTER — Other Ambulatory Visit: Payer: Self-pay | Admitting: Family Medicine

## 2023-12-07 NOTE — Telephone Encounter (Signed)
 Copied from CRM [id number]. Topic: Clinical - Medication Refill >> Dec 07, 2023 10:51 AM Montie POUR wrote: Medication: amLODipine  (NORVASC ) 10 MG tablet  Has the patient contacted their pharmacy? Yes (Agent: If no, request that the patient contact the pharmacy for the refill. If patient does not wish to contact the pharmacy document the reason why and proceed with request.) (Agent: If yes, when and what did the pharmacy advise?) Pharmacy needs order to refill  This is the patient's preferred pharmacy:  [id number] [place], [place] - [address] [facility] [address] [place] [address] Phone: [phone] Fax: [phone]  Is this the correct pharmacy for this prescription? Yes If no, delete pharmacy and type the correct one.   Has the prescription been filled recently? No  Is the patient out of the medication? No  Has the patient been seen for an appointment in the last year OR does the patient have an upcoming appointment? Yes - He made first available physical appointment for 01/10/24 at 9:30 AM  Can we respond through MyChart? Yes  Agent: Please be advised that Rx refills may take up to 3 business days. We ask that you follow-up with your pharmacy.

## 2023-12-08 DIAGNOSIS — H11822 Conjunctivochalasis, left eye: Secondary | ICD-10-CM | POA: Diagnosis not present

## 2023-12-08 LAB — HM DIABETES EYE EXAM

## 2023-12-09 MED ORDER — AMLODIPINE BESYLATE 10 MG PO TABS
10.0000 mg | ORAL_TABLET | Freq: Every day | ORAL | 0 refills | Status: DC
Start: 2023-12-09 — End: 2024-03-20

## 2023-12-09 NOTE — Telephone Encounter (Signed)
 Courtesy refill. Patient will need an office visit for additional refills.  Requested Prescriptions  Pending Prescriptions Disp Refills   amLODipine  (NORVASC ) 10 MG tablet 30 tablet 0    Sig: Take 1 tablet (10 mg total) by mouth daily.     Cardiovascular: Calcium  Channel Blockers 2 Failed - 12/09/2023  4:39 PM      Failed - Valid encounter within last 6 months    Recent Outpatient Visits           11 months ago Prostate cancer screening   [facility] Medicine Pickard, Butler DASEN, MD   1 year ago COPD exacerbation [facility])   [place] [facility] Duanne Butler DASEN, MD   1 year ago Diabetes mellitus without complication [facility])   [place] [facility] Pickard, Butler DASEN, MD              Passed - Last BP in normal range    BP Readings from Last 1 Encounters:  01/07/23 102/70         Passed - Last Heart Rate in normal range    Pulse Readings from Last 1 Encounters:  01/07/23 83

## 2023-12-13 DIAGNOSIS — G4733 Obstructive sleep apnea (adult) (pediatric): Secondary | ICD-10-CM | POA: Diagnosis not present

## 2024-01-05 ENCOUNTER — Ambulatory Visit (INDEPENDENT_AMBULATORY_CARE_PROVIDER_SITE_OTHER)

## 2024-01-05 VITALS — Ht 72.0 in | Wt 190.0 lb

## 2024-01-05 DIAGNOSIS — Z0000 Encounter for general adult medical examination without abnormal findings: Secondary | ICD-10-CM

## 2024-01-05 DIAGNOSIS — Z1211 Encounter for screening for malignant neoplasm of colon: Secondary | ICD-10-CM

## 2024-01-05 MED ORDER — ATORVASTATIN CALCIUM 80 MG PO TABS: 80.0000 mg | ORAL_TABLET | Freq: Every day | ORAL | 0 refills | Status: DC

## 2024-01-05 NOTE — Patient Instructions (Signed)
 Mr. Vincent Short,  Thank you for taking the time for your Medicare Wellness Visit. I appreciate your continued commitment to your health goals. Please review the care plan we discussed, and feel free to reach out if I can assist you further.  Medicare recommends these wellness visits once per year to help you and your care team stay ahead of potential health issues. These visits are designed to focus on prevention, allowing your provider to concentrate on managing your acute and chronic conditions during your regular appointments.  Please note that Annual Wellness Visits do not include a physical exam. Some assessments may be limited, especially if the visit was conducted virtually. If needed, we may recommend a separate in-person follow-up with your provider.  Ongoing Care Seeing your primary care provider every 3 to 6 months helps us  monitor your health and provide consistent, personalized care.   Referrals If a referral was made during today's visit and you haven't received any updates within two weeks, please contact the referred provider directly to check on the status.  Recommended Screenings:  Health Maintenance  Topic Date Due   Eye exam for diabetics  Never done   Colon Cancer Screening  03/15/2019   Zoster (Shingles) Vaccine (2 of 2) 09/23/2021   Yearly kidney function blood test for diabetes  01/28/2023   Complete foot exam   01/28/2023   Hemoglobin A1C  03/11/2023   Flu Shot  11/26/2023   COVID-19 Vaccine (5 - 2025-26 season) 12/27/2023   Yearly kidney health urinalysis for diabetes  01/28/2027*   Medicare Annual Wellness Visit  01/04/2025   DTaP/Tdap/Td vaccine (3 - Td or Tdap) 07/30/2031   Pneumococcal Vaccine for age over 87  Completed   Hepatitis C Screening  Completed   HPV Vaccine  Aged Out   Meningitis B Vaccine  Aged Out  *Topic was postponed. The date shown is not the original due date.       01/05/2024    2:13 PM  Advanced Directives  Does Patient Have a  Medical Advance Directive? No  Would patient like information on creating a medical advance directive? Yes (MAU/Ambulatory/Procedural Areas - Information given)   Advance Care Planning is important because it: Ensures you receive medical care that aligns with your values, goals, and preferences. Provides guidance to your family and loved ones, reducing the emotional burden of decision-making during critical moments.  Information on Advanced Care Planning can be found at [place]  Secretary of [facility] Advance Health Care Directives Advance Health Care Directives ([email or web address]   Vision: Annual vision screenings are recommended for early detection of glaucoma, cataracts, and diabetic retinopathy. These exams can also reveal signs of chronic conditions such as diabetes and high blood pressure.  Dental: Annual dental screenings help detect early signs of oral cancer, gum disease, and other conditions linked to overall health, including heart disease and diabetes.  Please see the attached documents for additional preventive care recommendations.

## 2024-01-05 NOTE — Progress Notes (Cosign Needed Addendum)
 Subjective:   Vincent Short is a 73 y.o. who presents for a Medicare Wellness preventive visit.  As a reminder, Annual Wellness Visits don't include a physical exam, and some assessments may be limited, especially if this visit is performed virtually. We may recommend an in-person follow-up visit with your provider if needed.  Visit Complete: Virtual I connected with  [name] on 01/05/24 by a audio enabled telemedicine application and verified that I am speaking with the correct person using two identifiers.  Patient Location: Home  Provider Location: Home Office  I discussed the limitations of evaluation and management by telemedicine. The patient expressed understanding and agreed to proceed.  Vital Signs: Because this visit was a virtual/telehealth visit, some criteria may be missing or patient reported. Any vitals not documented were not able to be obtained and vitals that have been documented are patient reported.  VideoDeclined- This patient declined Librarian, academic. Therefore the visit was completed with audio only.  Persons Participating in Visit: Patient.  AWV Questionnaire: No: Patient Medicare AWV questionnaire was not completed prior to this visit.  Cardiac Risk Factors include: advanced age (>86men, >75 women);male gender;dyslipidemia;smoking/ tobacco exposure;hypertension     Objective:    Today's Vitals   01/05/24 1127  Weight: 190 lb (86.2 kg)  Height: 6' (1.829 m)   Body mass index is 25.77 kg/m.     01/05/2024    2:13 PM 08/14/2022    2:24 PM 01/25/2020   10:32 AM  Advanced Directives  Does Patient Have a Medical Advance Directive? No Yes Yes  Type of Special educational needs teacher of [place];Living will Healthcare Power of [place];Living will  Would patient like information on creating a medical advance directive? Yes (MAU/Ambulatory/Procedural Areas - Information given)      Current Medications  (verified) Outpatient Encounter Medications as of 01/05/2024  Medication Sig   ACCU-CHEK GUIDE test strip USE AS DIRECTED DAILY   albuterol  (VENTOLIN  HFA) 108 (90 Base) MCG/ACT inhaler Inhale 2 puffs into the lungs every 6 (six) hours as needed for wheezing or shortness of breath.   amLODipine  (NORVASC ) 10 MG tablet Take 1 tablet (10 mg total) by mouth daily.   Blood Glucose Monitoring Suppl (BLOOD GLUCOSE SYSTEM PAK) KIT Please dispense based on patient and insurance preference. Use as directed to monitor FSBS 1x daily. Dx: E11.9.   budesonide-formoterol (SYMBICORT) 160-4.5 MCG/ACT inhaler Inhale 2 puffs into the lungs 2 (two) times daily.     empagliflozin  (JARDIANCE ) 25 MG TABS tablet Take 1 tablet (25 mg total) by mouth daily before breakfast.   fluticasone  (FLONASE ) 50 MCG/ACT nasal spray PLACE 2 SPRAYS INTO THE NOSTRIL(S) DAILY   ipratropium (ATROVENT ) 0.06 % nasal spray Place 2 sprays into both nostrils 4 (four) times daily.   Lancets MISC Please dispense based on patient and insurance preference. Use as directed to monitor FSBS 1x daily. Dx: E11.9.   levocetirizine (XYZAL ) 5 MG tablet TAKE 1 TABLET BY MOUTH EVERY DAY IN THE EVENING   losartan  (COZAAR ) 100 MG tablet Take 1 tablet (100 mg total) by mouth daily.   metFORMIN  (GLUCOPHAGE -XR) 500 MG 24 hr tablet Take 4 tablets (2,000 mg total) by mouth daily with breakfast.   metoprolol  succinate (TOPROL -XL) 25 MG 24 hr tablet Take 1 tablet (25 mg total) by mouth daily.   montelukast  (SINGULAIR ) 10 MG tablet TAKE 1 TABLET BY MOUTH EVERY DAY   Multiple Vitamin (MULTIVITAMIN) tablet Take 1 tablet by mouth daily.  niacin  (NIASPAN ) 1000 MG CR tablet TAKE 1 TABLET (1,000 MG TOTAL) BY MOUTH AT BEDTIME.   triamcinolone  cream (KENALOG ) 0.1 % APPLY TO AFFECTED AREA TWICE A DAY   [DISCONTINUED] atorvastatin  (LIPITOR) 80 MG tablet TAKE 1 TABLET BY MOUTH EVERY DAY   atorvastatin  (LIPITOR) 80 MG tablet Take 1 tablet (80 mg total) by mouth daily.    azithromycin  (ZITHROMAX ) 250 MG tablet 2 tabs poqday1, 1 tab poqday 2-5 (Patient not taking: Reported on 01/05/2024)   predniSONE  (DELTASONE ) 20 MG tablet 3 tabs poqday 1-2, 2 tabs poqday 3-4, 1 tab poqday 5-6 (Patient not taking: Reported on 01/05/2024)   No facility-administered encounter medications on file as of 01/05/2024.    Allergies (verified) Contrast media [iodinated contrast media]   History: Past Medical History:  Diagnosis Date   Allergic rhinitis    Asthma    Atelectasis    Rt upper lobe   COPD (chronic obstructive pulmonary disease) (HCC)    GERD (gastroesophageal reflux disease)    History of colon polyps 03/14/2009   Tubular adenomatous and Hyperplastic polyps   HTN (hypertension)    Hyperlipidemia    Lung mass    noted on CT scan(12/24/10)    S/P cardiac cath    negative   Tobacco abuse    Treadmill stress test negative for angina pectoris    Dr. Burnard   Past Surgical History:  Procedure Laterality Date   Arthroscopy left wrist with radial styloidectomy.  07/06/2000   Dr Murrell   Bronchoscopy with airway inspection, brush cytology,  11/22/2006   Dr Shellia   CARDIAC CATHETERIZATION  07/05/2002   Dr Burnard SAXON LVF, SMOOTH 10% TO 20% OSTIAL AND 10%-20% LUMINAL IRREGULARITY OF THE PROXIMAL LAD, SMOOTH 30%-40% PROXIMAL RCA,WHICH IMPROVED FOLLOWING INTRACORONARY NTG ADMINISTRATION SUGGESTING CORONARY VASOSPASM. NO AORTOILIAC DISEASE.   DOPPLER ECHOCARDIOGRAPHY N/A 06/22/08   LV SIZE IS NORMAL. LV FUNCTION IS NORMAL. EF=>55%. THERE IS BORDERLINE CONCENTRIC LVH.   FRACTURE SURGERY     Left wrist scaphoid fx   FRACTURE SURGERY     Left ankle surgery   Left ankle surgery  11/11/2005   Dr Jane   LUNG SURGERY  01/21/11   Dr. Lucas   FOB, R THORACOTOMY WITH RIGHT UPPER LOBECTOMY   LUNG SURGERY  01/21/11   FOB, R THORACOTOMY WITH RIGHT UPPER LOBECTOMY   NASAL SINUS SURGERY  1990   NUCLEAR STRESS TEST N/A 09/09/10   NORMAL PATTERN OF PERFUSION IN ALL REGIONS. EF IS 63%.  NO WALL MOTION ABNORMALITIES.   ROTATOR CUFF REPAIR  pending   Dr. Jane   TONSILLECTOMY     Family History  Problem Relation Age of Onset   Coronary artery disease Father    Allergic rhinitis Father    Heart disease Father    Coronary artery disease Mother    Allergic rhinitis Mother    Heart disease Mother    Kidney disease Mother    Stroke Mother    Social History   Socioeconomic History   Marital status: Married    Spouse name: Not on file   Number of children: Not on file   Years of education: Not on file   Highest education level: Not on file  Occupational History   Occupation: heating and AC    Employer: [employer]  Tobacco Use   Smoking status: Former    Current packs/day: 0.00    Types: Cigarettes    Quit date: 02/26/2020    Years since quitting: 3.8  Smokeless tobacco: Never  Substance and Sexual Activity   Alcohol use: Yes   Drug use: No   Sexual activity: Not on file  Other Topics Concern   Not on file  Social History Narrative    He is married.  He works in Production assistant, radio.  He smoked 1 pack of cigarettes a day for the last 35     years.  He has occasional alcohol use.    Social Drivers of Corporate investment banker Strain: Low Risk  (01/05/2024)   Overall Financial Resource Strain (CARDIA)    Difficulty of Paying Living Expenses: Not hard at all  Food Insecurity: No Food Insecurity (01/05/2024)   Hunger Vital Sign    Worried About Running Out of Food in the Last Year: Never true    Ran Out of Food in the Last Year: Never true  Transportation Needs: No Transportation Needs (01/05/2024)   PRAPARE - Administrator, Civil Service (Medical): No    Lack of Transportation (Non-Medical): No  Physical Activity: Insufficiently Active (01/05/2024)   Exercise Vital Sign    Days of Exercise per Week: 3 days    Minutes of Exercise per Session: 30 min  Stress: No Stress Concern Present (01/05/2024)   [employer] of  Occupational Health - Occupational Stress Questionnaire    Feeling of Stress: Not at all  Social Connections: Socially Integrated (01/05/2024)   Social Connection and Isolation Panel    Frequency of Communication with Friends and Family: More than three times a week    Frequency of Social Gatherings with Friends and Family: Never    Attends Religious Services: More than 4 times per year    Active Member of [employer] or Organizations: Yes    Attends Engineer, structural: More than 4 times per year    Marital Status: Married    Tobacco Counseling Counseling given: Not Answered    Clinical Intake:  Pre-visit preparation completed: Yes  Pain : No/denies pain  Diabetes: Yes CBG done?: No Did pt. bring in CBG monitor from home?: No  Lab Results  Component Value Date   HGBA1C 7.2 (H) 09/08/2022   HGBA1C 6.6 (H) 01/27/2022   HGBA1C 6.3 (H) 07/07/2021     How often do you need to have someone help you when you read instructions, pamphlets, or other written materials from your doctor or pharmacy?: 1 - Never  Interpreter Needed?: No  Information entered by :: Charmaine Bloodgood LPN   Activities of Daily Living     01/05/2024    1:00 PM  In your present state of health, do you have any difficulty performing the following activities:  Hearing? 0  Vision? 0  Difficulty concentrating or making decisions? 0  Walking or climbing stairs? 0  Dressing or bathing? 0  Doing errands, shopping? 0  Preparing Food and eating ? N  Using the Toilet? N  In the past six months, have you accidently leaked urine? N  Do you have problems with loss of bowel control? N  Managing your Medications? N  Managing your Finances? N  Housekeeping or managing your Housekeeping? N    Patient Care Team: Duanne Butler DASEN, MD as PCP - General (Family Medicine) Melodi Lerner, MD as Consulting Physician (Orthopedic Surgery[facility], [facility]  I have updated your Care Teams any  recent Medical Services you may have received from other providers in the past year.  Assessment:   This is a routine wellness examination for [place].  Hearing/Vision screen Hearing Screening - Comments:: Denies hearing difficulties   Vision Screening - Comments:: Wears rx glasses - up to date with routine eye exams with Dr. Oneil Blumenthal    Goals Addressed             This Visit's Progress    Remain active and independent   On track      Depression Screen     01/05/2024    1:03 PM 01/07/2023    2:18 PM 08/14/2022    2:34 PM 01/27/2022    8:10 AM 07/11/2021    9:35 AM 01/25/2020   10:31 AM 07/20/2019   11:27 AM  PHQ 2/9 Scores  PHQ - 2 Score 0 0 0 0 0 0 0  PHQ- 9 Score  0   0      Fall Risk     01/05/2024    2:14 PM 01/07/2023    2:18 PM 01/27/2022    8:13 AM 07/11/2021    9:34 AM 01/25/2020   10:31 AM  Fall Risk   Falls in the past year? 0 0 0 0 0  Number falls in past yr: 0 0 0 0 0  Injury with Fall? 0 0 0 0 0  Risk for fall due to : Orthopedic patient  No Fall Risks    Follow up Falls prevention discussed;Education provided;Falls evaluation completed  Falls prevention discussed        Data saved with a previous flowsheet row definition    MEDICARE RISK AT HOME:  Medicare Risk at Home Any stairs in or around the home?: No If so, are there any without handrails?: No Home free of loose throw rugs in walkways, pet beds, electrical cords, etc?: Yes Adequate lighting in your home to reduce risk of falls?: Yes Life alert?: No Use of a cane, walker or w/c?: No Grab bars in the bathroom?: Yes Shower chair or bench in shower?: Yes Elevated toilet seat or a handicapped toilet?: Yes  TIMED UP AND GO:  Was the test performed?  No  Cognitive Function: 6CIT completed        01/05/2024    2:14 PM 08/14/2022    2:40 PM  6CIT Screen  What Year? 0 points 0 points  What month? 0 points 0 points  What time? 0 points 0 points  Count back from 20 0 points 0 points   Months in reverse 0 points 0 points  Repeat phrase 0 points 0 points  Total Score 0 points 0 points    Immunizations Immunization History  Administered Date(s) Administered   Fluad Quad(high Dose 65+) 01/04/2019, 01/25/2020   INFLUENZA, HIGH DOSE SEASONAL PF 02/25/2017, 03/04/2018, 01/27/2022   Influenza,inj,Quad PF,6+ Mos 03/21/2013, 04/17/2014   Influenza-Unspecified 02/15/2003, 03/01/2004, 03/11/2021   PFIZER(Purple Top)SARS-COV-2 Vaccination 05/22/2019, 06/22/2019, 04/14/2020   Pfizer(Comirnaty)Fall Seasonal Vaccine 12 years and older 03/11/2021   Pneumococcal Conjugate-13 04/17/2014   Pneumococcal Polysaccharide-23 03/21/2013, 01/25/2020   Td 06/27/2002   Tdap 07/29/2021   Zoster Recombinant(Shingrix) 07/29/2021    Screening Tests Health Maintenance  Topic Date Due   OPHTHALMOLOGY EXAM  Never done   Colonoscopy  03/15/2019   Zoster Vaccines- Shingrix (2 of 2) 09/23/2021   Diabetic kidney evaluation - eGFR measurement  01/28/2023   FOOT EXAM  01/28/2023   HEMOGLOBIN A1C  03/11/2023   Influenza Vaccine  11/26/2023   COVID-19 Vaccine (5 - 2025-26 season) 12/27/2023   Diabetic kidney  evaluation - Urine ACR  01/28/2027 (Originally 10/30/1968)   Medicare Annual Wellness (AWV)  01/04/2025   DTaP/Tdap/Td (3 - Td or Tdap) 07/30/2031   Pneumococcal Vaccine: 50+ Years  Completed   Hepatitis C Screening  Completed   HPV VACCINES  Aged Out   Meningococcal B Vaccine  Aged Out    Health Maintenance Items Addressed: Referral sent to GI for colonoscopy; Requesting records for last diabetic eye exam    Additional Screening:  Vision Screening: Recommended annual ophthalmology exams for early detection of glaucoma and other disorders of the eye. Is the patient up to date with their annual eye exam?  Yes  Who is the provider or what is the name of the office in which the patient attends annual eye exams? Dr. Oneil Blumenthal   Dental Screening: Recommended annual dental exams for proper  oral hygiene  Community Resource Referral / Chronic Care Management: CRR required this visit?  No   CCM required this visit?  No   Plan:    I have personally reviewed and noted the following in the patient's chart:   Medical and social history Use of alcohol, tobacco or illicit drugs  Current medications and supplements including opioid prescriptions. Patient is not currently taking opioid prescriptions. Functional ability and status Nutritional status Physical activity Advanced directives List of other physicians Hospitalizations, surgeries, and ER visits in previous 12 months Vitals Screenings to include cognitive, depression, and falls Referrals and appointments  In addition, I have reviewed and discussed with patient certain preventive protocols, quality metrics, and best practice recommendations. A written personalized care plan for preventive services as well as general preventive health recommendations were provided to patient.   Lavelle Pfeiffer [place], [place]   [id number]   After Visit Summary: (MyChart) Due to this being a telephonic visit, the after visit summary with patients personalized plan was offered to patient via MyChart   Notes: Nothing significant to report at this time.

## 2024-01-10 ENCOUNTER — Ambulatory Visit (INDEPENDENT_AMBULATORY_CARE_PROVIDER_SITE_OTHER): Admitting: Family Medicine

## 2024-01-10 ENCOUNTER — Encounter: Payer: Self-pay | Admitting: Family Medicine

## 2024-01-10 VITALS — BP 126/76 | HR 76 | Temp 97.6°F | Ht 72.0 in | Wt 200.6 lb

## 2024-01-10 DIAGNOSIS — E7800 Pure hypercholesterolemia, unspecified: Secondary | ICD-10-CM

## 2024-01-10 DIAGNOSIS — Z7984 Long term (current) use of oral hypoglycemic drugs: Secondary | ICD-10-CM

## 2024-01-10 DIAGNOSIS — Z23 Encounter for immunization: Secondary | ICD-10-CM

## 2024-01-10 DIAGNOSIS — I2510 Atherosclerotic heart disease of native coronary artery without angina pectoris: Secondary | ICD-10-CM | POA: Diagnosis not present

## 2024-01-10 DIAGNOSIS — Z0001 Encounter for general adult medical examination with abnormal findings: Secondary | ICD-10-CM

## 2024-01-10 DIAGNOSIS — E119 Type 2 diabetes mellitus without complications: Secondary | ICD-10-CM | POA: Diagnosis not present

## 2024-01-10 DIAGNOSIS — Z122 Encounter for screening for malignant neoplasm of respiratory organs: Secondary | ICD-10-CM

## 2024-01-10 DIAGNOSIS — Z125 Encounter for screening for malignant neoplasm of prostate: Secondary | ICD-10-CM | POA: Diagnosis not present

## 2024-01-10 DIAGNOSIS — Z0000 Encounter for general adult medical examination without abnormal findings: Secondary | ICD-10-CM

## 2024-01-10 DIAGNOSIS — Z1211 Encounter for screening for malignant neoplasm of colon: Secondary | ICD-10-CM

## 2024-01-10 MED ORDER — COVID-19 MRNA VACC (MODERNA) 50 MCG/0.5ML IM SUSY: 0.5000 mL | PREFILLED_SYRINGE | Freq: Once | INTRAMUSCULAR | 0 refills | Status: AC

## 2024-01-10 NOTE — Progress Notes (Signed)
 Subjective:    Patient ID: Vincent Short, male    DOB: 1950-06-21, 73 y.o.   MRN: [id number]  HPI Patient is a very pleasant 73 year old Caucasian gentleman here today for complete physical exam.  He believes he has had 2 shingles vaccines.  Only 1 is listed.  He wants to check his records prior to receiving the second dose.  He is due for a flu shot.  He will also receive that today.  He asked us  to send a prescription for the COVID shot to his pharmacy.  We also discussed RSV.  Patient quit smoking 4 years ago.  He is due to repeat a CT scan to screen for lung cancer.  However he is extremely active.  He states that he was riding a bicycle up to 30 miles a day.  He denies any chest pain or shortness of breath.  He is not having to use any daily maintenance inhaler for his COPD.  He seldom needs his albuterol .  He denies any angina.  His blood pressure is well-controlled.  He is due for a colonoscopy.  He is also due for PSA Immunization History  Administered Date(s) Administered   Fluad Quad(high Dose 65+) 01/04/2019, 01/25/2020   INFLUENZA, HIGH DOSE SEASONAL PF 02/25/2017, 03/04/2018, 01/27/2022   Influenza,inj,Quad PF,6+ Mos 03/21/2013, 04/17/2014   Influenza-Unspecified 02/15/2003, 03/01/2004, 03/11/2021   PFIZER(Purple Top)SARS-COV-2 Vaccination 05/22/2019, 06/22/2019, 04/14/2020   Pfizer(Comirnaty)Fall Seasonal Vaccine 12 years and older 03/11/2021   Pneumococcal Conjugate-13 04/17/2014   Pneumococcal Polysaccharide-23 03/21/2013, 01/25/2020   Td 06/27/2002   Tdap 07/29/2021   Zoster Recombinant(Shingrix) 07/29/2021   Scanned Document on 01/06/2024  Component Date Value Ref Range Status   HM Diabetic Eye Exam 12/08/2023    Final   UNABLE TO DETERMINE   HM Diabetic Eye Exam 10/28/2023    Final   UNABLE TO DETERMINE ABST BY HIM    Past Medical History:  Diagnosis Date   Allergic rhinitis    Asthma    Atelectasis    Rt upper lobe   COPD (chronic obstructive pulmonary  disease) (HCC)    GERD (gastroesophageal reflux disease)    History of colon polyps 03/14/2009   Tubular adenomatous and Hyperplastic polyps   HTN (hypertension)    Hyperlipidemia    Lung mass    noted on CT scan(12/24/10)    S/P cardiac cath    negative   Tobacco abuse    Treadmill stress test negative for angina pectoris    Dr. Burnard   Past Surgical History:  Procedure Laterality Date   Arthroscopy left wrist with radial styloidectomy.  07/06/2000   Dr Murrell   Bronchoscopy with airway inspection, brush cytology,  11/22/2006   Dr Shellia   CARDIAC CATHETERIZATION  07/05/2002   Dr Burnard SAXON LVF, SMOOTH 10% TO 20% OSTIAL AND 10%-20% LUMINAL IRREGULARITY OF THE PROXIMAL LAD, SMOOTH 30%-40% PROXIMAL RCA,WHICH IMPROVED FOLLOWING INTRACORONARY NTG ADMINISTRATION SUGGESTING CORONARY VASOSPASM. NO AORTOILIAC DISEASE.   DOPPLER ECHOCARDIOGRAPHY N/A 06/22/08   LV SIZE IS NORMAL. LV FUNCTION IS NORMAL. EF=>55%. THERE IS BORDERLINE CONCENTRIC LVH.   FRACTURE SURGERY     Left wrist scaphoid fx   FRACTURE SURGERY     Left ankle surgery   Left ankle surgery  11/11/2005   Dr Jane   LUNG SURGERY  01/21/11   Dr. Lucas   FOB, R THORACOTOMY WITH RIGHT UPPER LOBECTOMY   LUNG SURGERY  01/21/11   FOB, R THORACOTOMY WITH RIGHT UPPER LOBECTOMY   NASAL  SINUS SURGERY  1990   NUCLEAR STRESS TEST N/A 09/09/10   NORMAL PATTERN OF PERFUSION IN ALL REGIONS. EF IS 63%. NO WALL MOTION ABNORMALITIES.   ROTATOR CUFF REPAIR  pending   Dr. Jane   TONSILLECTOMY     Current Outpatient Medications on File Prior to Visit  Medication Sig Dispense Refill   ACCU-CHEK GUIDE test strip USE AS DIRECTED DAILY 100 strip 11   albuterol  (VENTOLIN  HFA) 108 (90 Base) MCG/ACT inhaler Inhale 2 puffs into the lungs every 6 (six) hours as needed for wheezing or shortness of breath. 1 each 5   amLODipine  (NORVASC ) 10 MG tablet Take 1 tablet (10 mg total) by mouth daily. 30 tablet 0   atorvastatin  (LIPITOR) 80 MG tablet Take 1  tablet (80 mg total) by mouth daily. 90 tablet 0   azithromycin  (ZITHROMAX ) 250 MG tablet 2 tabs poqday1, 1 tab poqday 2-5 (Patient not taking: Reported on 01/10/2024) 6 tablet 0   Blood Glucose Monitoring Suppl (BLOOD GLUCOSE SYSTEM PAK) KIT Please dispense based on patient and insurance preference. Use as directed to monitor FSBS 1x daily. Dx: E11.9. 1 kit 1   budesonide-formoterol (SYMBICORT) 160-4.5 MCG/ACT inhaler Inhale 2 puffs into the lungs 2 (two) times daily.       empagliflozin  (JARDIANCE ) 25 MG TABS tablet Take 1 tablet (25 mg total) by mouth daily before breakfast. 90 tablet 3   fluticasone  (FLONASE ) 50 MCG/ACT nasal spray PLACE 2 SPRAYS INTO THE NOSTRIL(S) DAILY 48 mL 3   ipratropium (ATROVENT ) 0.06 % nasal spray Place 2 sprays into both nostrils 4 (four) times daily. 15 mL 12   Lancets MISC Please dispense based on patient and insurance preference. Use as directed to monitor FSBS 1x daily. Dx: E11.9. 100 each 1   levocetirizine (XYZAL ) 5 MG tablet TAKE 1 TABLET BY MOUTH EVERY DAY IN THE EVENING 90 tablet 3   losartan  (COZAAR ) 100 MG tablet Take 1 tablet (100 mg total) by mouth daily. 90 tablet 3   metFORMIN  (GLUCOPHAGE -XR) 500 MG 24 hr tablet Take 4 tablets (2,000 mg total) by mouth daily with breakfast. 360 tablet 0   metoprolol  succinate (TOPROL -XL) 25 MG 24 hr tablet Take 1 tablet (25 mg total) by mouth daily. 90 tablet 0   montelukast  (SINGULAIR ) 10 MG tablet TAKE 1 TABLET BY MOUTH EVERY DAY 90 tablet 0   Multiple Vitamin (MULTIVITAMIN) tablet Take 1 tablet by mouth daily.     niacin  (NIASPAN ) 1000 MG CR tablet TAKE 1 TABLET (1,000 MG TOTAL) BY MOUTH AT BEDTIME. 30 tablet 5   predniSONE  (DELTASONE ) 20 MG tablet 3 tabs poqday 1-2, 2 tabs poqday 3-4, 1 tab poqday 5-6 (Patient not taking: Reported on 01/05/2024) 12 tablet 0   triamcinolone  cream (KENALOG ) 0.1 % APPLY TO AFFECTED AREA TWICE A DAY 30 g 0   No current facility-administered medications on file prior to visit.    Allergies  Allergen Reactions   Contrast Media [Iodinated Contrast Media] Swelling    Allergic reaction 1987, ok w/ 13 hr prep//a.c.   Social History   Socioeconomic History   Marital status: Married    Spouse name: Not on file   Number of children: Not on file   Years of education: Not on file   Highest education level: Not on file  Occupational History   Occupation: heating and AC    Employer: [employer]  Tobacco Use   Smoking status: Former    Current packs/day: 0.00    Types: Cigarettes  Quit date: 02/26/2020    Years since quitting: 3.8   Smokeless tobacco: Never  Substance and Sexual Activity   Alcohol use: Yes   Drug use: No   Sexual activity: Not on file  Other Topics Concern   Not on file  Social History Narrative    He is married.  He works in Production assistant, radio.  He smoked 1 pack of cigarettes a day for the last 35     years.  He has occasional alcohol use.    Social Drivers of Corporate investment banker Strain: Low Risk  (01/05/2024)   Overall Financial Resource Strain (CARDIA)    Difficulty of Paying Living Expenses: Not hard at all  Food Insecurity: No Food Insecurity (01/05/2024)   Hunger Vital Sign    Worried About Running Out of Food in the Last Year: Never true    Ran Out of Food in the Last Year: Never true  Transportation Needs: No Transportation Needs (01/05/2024)   PRAPARE - Administrator, Civil Service (Medical): No    Lack of Transportation (Non-Medical): No  Physical Activity: Insufficiently Active (01/05/2024)   Exercise Vital Sign    Days of Exercise per Week: 3 days    Minutes of Exercise per Session: 30 min  Stress: No Stress Concern Present (01/05/2024)   [employer] of Occupational Health - Occupational Stress Questionnaire    Feeling of Stress: Not at all  Social Connections: Socially Integrated (01/05/2024)   Social Connection and Isolation Panel    Frequency of Communication with Friends  and Family: More than three times a week    Frequency of Social Gatherings with Friends and Family: Never    Attends Religious Services: More than 4 times per year    Active Member of [employer] or Organizations: Yes    Attends Banker Meetings: More than 4 times per year    Marital Status: Married  Catering manager Violence: Not At Risk (01/05/2024)   Humiliation, Afraid, Rape, and Kick questionnaire    Fear of Current or Ex-Partner: No    Emotionally Abused: No    Physically Abused: No    Sexually Abused: No     Review of Systems  All other systems reviewed and are negative.      Objective:   Physical Exam Vitals reviewed.  Neck:     Thyroid : No thyromegaly.     Vascular: No JVD.  Cardiovascular:     Rate and Rhythm: Normal rate.     Heart sounds: Normal heart sounds. No murmur heard. Pulmonary:     Effort: Pulmonary effort is normal. No respiratory distress.     Breath sounds: Normal breath sounds. No wheezing or rales.  Abdominal:     General: Bowel sounds are normal. There is no distension.     Palpations: Abdomen is soft.     Tenderness: There is no abdominal tenderness. There is no rebound.  Musculoskeletal:     Cervical back: Neck supple.           Assessment & Plan:  Diabetes mellitus without complication (HCC) - Plan: CBC with Differential/Platelet, Comprehensive metabolic panel with GFR, Lipid panel, Hemoglobin A1c, Microalbumin/Creatinine Ratio, Urine  Pure hypercholesterolemia - Plan: CBC with Differential/Platelet, Comprehensive metabolic panel with GFR, Lipid panel, Hemoglobin A1c, Microalbumin/Creatinine Ratio, Urine  Prostate cancer screening - Plan: PSA  Coronary artery disease involving native coronary artery of native heart without angina pectoris - Plan: CBC with  Differential/Platelet, Comprehensive metabolic panel with GFR, Lipid panel, Hemoglobin A1c, Microalbumin/Creatinine Ratio, Urine  General medical exam  Screening for lung  cancer - Plan: CT CHEST LUNG CA SCREEN LOW DOSE W/O CM  Colon cancer screening - Plan: Ambulatory referral to Gastroenterology Patient received his flu shot today.  We will send a prescription to his pharmacy for COVID.  He will check on the second shingles shot.  I will screen the patient for lung cancer with a CT scan.  I will screen the patient for colon cancer with a colonoscopy.  Will screen for prostate cancer with a PSA.  Check CBC CMP lipid panel A1c and urine microalbumin to creatinine ratio.  Goal LDL cholesterol is less than 55.  Goal A1c is less than 6.5.  Blood pressure is at goal.  Foot exam was performed today and is normal.

## 2024-01-10 NOTE — Addendum Note (Signed)
 Addended by: ANGELENA RONAL BRADLEY K on: 01/10/2024 11:52 AM   Modules accepted: Orders

## 2024-01-11 ENCOUNTER — Other Ambulatory Visit: Payer: Self-pay

## 2024-01-11 ENCOUNTER — Ambulatory Visit: Payer: Self-pay | Admitting: Family Medicine

## 2024-01-11 DIAGNOSIS — E119 Type 2 diabetes mellitus without complications: Secondary | ICD-10-CM

## 2024-01-11 DIAGNOSIS — I2510 Atherosclerotic heart disease of native coronary artery without angina pectoris: Secondary | ICD-10-CM

## 2024-01-11 DIAGNOSIS — E7800 Pure hypercholesterolemia, unspecified: Secondary | ICD-10-CM

## 2024-01-11 DIAGNOSIS — Z23 Encounter for immunization: Secondary | ICD-10-CM

## 2024-01-11 DIAGNOSIS — J441 Chronic obstructive pulmonary disease with (acute) exacerbation: Secondary | ICD-10-CM

## 2024-01-11 LAB — CBC WITH DIFFERENTIAL/PLATELET
Absolute Lymphocytes: 1602 {cells}/uL (ref 850–3900)
Absolute Monocytes: 828 {cells}/uL (ref 200–950)
Basophils Absolute: 55 {cells}/uL (ref 0–200)
Basophils Relative: 0.6 %
Eosinophils Absolute: 282 {cells}/uL (ref 15–500)
Eosinophils Relative: 3.1 %
HCT: 47.3 % (ref 38.5–50.0)
Hemoglobin: 15.6 g/dL (ref 13.2–17.1)
MCH: 31 pg (ref 27.0–33.0)
MCHC: 33 g/dL (ref 32.0–36.0)
MCV: 94 fL (ref 80.0–100.0)
MPV: 10.7 fL (ref 7.5–12.5)
Monocytes Relative: 9.1 %
Neutro Abs: 6334 {cells}/uL (ref 1500–7800)
Neutrophils Relative %: 69.6 %
Platelets: 260 Thousand/uL (ref 140–400)
RBC: 5.03 Million/uL (ref 4.20–5.80)
RDW: 12.9 % (ref 11.0–15.0)
Total Lymphocyte: 17.6 %
WBC: 9.1 Thousand/uL (ref 3.8–10.8)

## 2024-01-11 LAB — COMPREHENSIVE METABOLIC PANEL WITH GFR
AG Ratio: 2 (calc) (ref 1.0–2.5)
ALT: 24 U/L (ref 9–46)
AST: 26 U/L (ref 10–35)
Albumin: 4.6 g/dL (ref 3.6–5.1)
Alkaline phosphatase (APISO): 55 U/L (ref 35–144)
BUN: 15 mg/dL (ref 7–25)
CO2: 28 mmol/L (ref 20–32)
Calcium: 9.7 mg/dL (ref 8.6–10.3)
Chloride: 106 mmol/L (ref 98–110)
Creat: 0.76 mg/dL (ref 0.70–1.28)
Globulin: 2.3 g/dL (ref 1.9–3.7)
Glucose, Bld: 116 mg/dL — ABNORMAL HIGH (ref 65–99)
Potassium: 4.8 mmol/L (ref 3.5–5.3)
Sodium: 140 mmol/L (ref 135–146)
Total Bilirubin: 0.5 mg/dL (ref 0.2–1.2)
Total Protein: 6.9 g/dL (ref 6.1–8.1)
eGFR: 95 mL/min/1.73m2 (ref >=60)

## 2024-01-11 LAB — MICROALBUMIN / CREATININE URINE RATIO
Creatinine, Urine: 57 mg/dL (ref 20–320)
Microalb Creat Ratio: 5 mg/g{creat} (ref <30)
Microalb, Ur: 0.3 mg/dL

## 2024-01-11 LAB — HEMOGLOBIN A1C
Hgb A1c MFr Bld: 6.5 % — ABNORMAL HIGH (ref <5.7)
Mean Plasma Glucose: 140 mg/dL
eAG (mmol/L): 7.7 mmol/L

## 2024-01-11 LAB — PSA: PSA: 0.25 ng/mL (ref <=4.00)

## 2024-01-11 LAB — LIPID PANEL
Cholesterol: 129 mg/dL (ref <200)
HDL: 44 mg/dL (ref >=40)
LDL Cholesterol (Calc): 65 mg/dL
Non-HDL Cholesterol (Calc): 85 mg/dL (ref <130)
Total CHOL/HDL Ratio: 2.9 (calc) (ref <5.0)
Triglycerides: 116 mg/dL (ref <150)

## 2024-01-11 MED ORDER — COVID-19 MRNA VACC (MODERNA) 50 MCG/0.5ML IM SUSY: 0.5000 mL | PREFILLED_SYRINGE | Freq: Once | INTRAMUSCULAR | 0 refills | Status: AC

## 2024-01-13 ENCOUNTER — Telehealth: Payer: Self-pay | Admitting: Family Medicine

## 2024-01-13 ENCOUNTER — Other Ambulatory Visit: Payer: Self-pay

## 2024-01-13 DIAGNOSIS — E119 Type 2 diabetes mellitus without complications: Secondary | ICD-10-CM

## 2024-01-13 DIAGNOSIS — I2510 Atherosclerotic heart disease of native coronary artery without angina pectoris: Secondary | ICD-10-CM

## 2024-01-13 DIAGNOSIS — G4733 Obstructive sleep apnea (adult) (pediatric): Secondary | ICD-10-CM | POA: Diagnosis not present

## 2024-01-13 MED ORDER — EMPAGLIFLOZIN 25 MG PO TABS: 25.0000 mg | ORAL_TABLET | Freq: Every day | ORAL | 1 refills | Status: AC

## 2024-01-13 NOTE — Telephone Encounter (Signed)
 Prescription Request  01/13/2024  LOV: 01/10/2024  What is the name of the medication or equipment?   empagliflozin  (JARDIANCE ) 25 MG TABS tablet  **90 day script request**  Have you contacted your pharmacy to request a refill? Yes   Which pharmacy would you like this sent to?  [id number] - [place], [place] - [address] [place] [address] Phone: [phone] Fax: [phone]    Patient notified that their request is being sent to the clinical staff for review and that they should receive a response within 2 business days.   Please advise pharmacist.

## 2024-01-28 ENCOUNTER — Other Ambulatory Visit: Payer: Self-pay | Admitting: Family Medicine

## 2024-01-28 ENCOUNTER — Encounter: Payer: Self-pay | Admitting: Family Medicine

## 2024-01-28 DIAGNOSIS — E119 Type 2 diabetes mellitus without complications: Secondary | ICD-10-CM

## 2024-01-28 DIAGNOSIS — E7800 Pure hypercholesterolemia, unspecified: Secondary | ICD-10-CM

## 2024-01-30 ENCOUNTER — Other Ambulatory Visit: Payer: Self-pay | Admitting: Family Medicine

## 2024-01-30 DIAGNOSIS — J329 Chronic sinusitis, unspecified: Secondary | ICD-10-CM

## 2024-01-31 ENCOUNTER — Other Ambulatory Visit: Payer: Self-pay

## 2024-01-31 MED ORDER — METOPROLOL SUCCINATE ER 25 MG PO TB24: 25.0000 mg | ORAL_TABLET | Freq: Every day | ORAL | 0 refills | Status: DC

## 2024-02-02 DIAGNOSIS — M25661 Stiffness of right knee, not elsewhere classified: Secondary | ICD-10-CM | POA: Diagnosis not present

## 2024-02-02 DIAGNOSIS — G8929 Other chronic pain: Secondary | ICD-10-CM | POA: Diagnosis not present

## 2024-02-02 DIAGNOSIS — M25561 Pain in right knee: Secondary | ICD-10-CM | POA: Diagnosis not present

## 2024-02-02 NOTE — H&P (Signed)
 TOTAL KNEE ADMISSION H&P  Patient is being admitted for right total knee arthroplasty.  Subjective:  Chief Complaint: Right knee pain.  HPI: Vincent Short, 73 y.o. male has a history of pain and functional disability in the right knee due to arthritis and has failed non-surgical conservative treatments for greater than 12 weeks to include corticosteriod injections, viscosupplementation injections, and activity modification. Onset of symptoms was gradual, starting several years ago with gradually worsening course since that time. The patient noted no past surgery on the right knee.  Patient currently rates pain in the right knee at 8 out of 10 with activity. Patient has worsening of pain with activity and weight bearing and pain that interferes with activities of daily living. Patient has evidence of periarticular osteophytes and joint space narrowing by imaging studies. There is no active infection.  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   Hyperlipidemia    Seroma, postoperative 04/06/2011   COPD (chronic obstructive pulmonary disease) (HCC)    Lung mass    Atelectasis    IRRITABLE BOWEL SYNDROME 03/07/2009   HEMORRHAGE OF RECTUM AND ANUS 03/07/2009   OBSTRUCTIVE SLEEP APNEA 08/08/2008   TOBACCO ABUSE 08/06/2008   HYPERCHOLESTEROLEMIA 07/12/2007   HYPERTENSION 07/12/2007   ALLERGIC RHINITIS 07/12/2007   ASTHMA 07/12/2007   DUODENITIS, WITHOUT HEMORRHAGE 07/12/2007    Past Medical History:  Diagnosis Date   Allergic rhinitis    Asthma    Atelectasis    Rt upper lobe   COPD (chronic obstructive pulmonary disease) (HCC)    GERD (gastroesophageal reflux disease)    History of colon polyps  03/14/2009   Tubular adenomatous and Hyperplastic polyps   HTN (hypertension)    Hyperlipidemia    Lung mass    noted on CT scan(12/24/10)    S/P cardiac cath    negative   Tobacco abuse    Treadmill stress test negative for angina pectoris    Dr. Burnard    Past Surgical History:  Procedure Laterality Date   Arthroscopy left wrist with radial styloidectomy.  07/06/2000   Dr Murrell   Bronchoscopy with airway inspection, brush cytology,  11/22/2006   Dr Shellia   CARDIAC CATHETERIZATION  07/05/2002   Dr Burnard SAXON LVF, SMOOTH 10% TO 20% OSTIAL AND 10%-20% LUMINAL IRREGULARITY OF THE PROXIMAL LAD, SMOOTH 30%-40% PROXIMAL RCA,WHICH IMPROVED FOLLOWING INTRACORONARY NTG ADMINISTRATION SUGGESTING CORONARY VASOSPASM. NO AORTOILIAC DISEASE.   DOPPLER ECHOCARDIOGRAPHY N/A 06/22/08   LV SIZE IS NORMAL. LV FUNCTION IS NORMAL. EF=>55%. THERE IS BORDERLINE CONCENTRIC LVH.   FRACTURE SURGERY     Left wrist scaphoid fx   FRACTURE SURGERY     Left ankle surgery   Left ankle surgery  11/11/2005   Dr Jane   LUNG SURGERY  01/21/11   Dr. Lucas   FOB, R THORACOTOMY WITH RIGHT UPPER LOBECTOMY   LUNG SURGERY  01/21/11   FOB, R THORACOTOMY WITH RIGHT UPPER LOBECTOMY   NASAL SINUS SURGERY  1990   NUCLEAR STRESS TEST N/A 09/09/10   NORMAL PATTERN OF PERFUSION IN ALL REGIONS. EF IS 63%. NO WALL MOTION ABNORMALITIES.   ROTATOR CUFF REPAIR  pending   Dr. Jane   TONSILLECTOMY      Prior  to Admission medications   Medication Sig Start Date End Date Taking? Authorizing Provider  ACCU-CHEK GUIDE test strip USE AS DIRECTED DAILY 02/24/22   Kayla Jeoffrey RAMAN, FNP  albuterol  (VENTOLIN  HFA) 108 (90 Base) MCG/ACT inhaler Inhale 2 puffs into the lungs every 6 (six) hours as needed for wheezing or shortness of breath. 08/19/23   Duanne Butler DASEN, MD  amLODipine  (NORVASC ) 10 MG tablet Take 1 tablet (10 mg total) by mouth daily. 12/09/23   Duanne Butler DASEN, MD  atorvastatin  (LIPITOR) 80 MG tablet Take 1 tablet (80 mg  total) by mouth daily. 01/05/24   Duanne Butler DASEN, MD  Blood Glucose Monitoring Suppl (BLOOD GLUCOSE SYSTEM PAK) KIT Please dispense based on patient and insurance preference. Use as directed to monitor FSBS 1x daily. Dx: E11.9. 02/14/21   Duanne Butler DASEN, MD  budesonide-formoterol [facility]) 160-4.5 MCG/ACT inhaler Inhale 2 puffs into the lungs 2 (two) times daily.      [provider]  empagliflozin  (JARDIANCE ) 25 MG TABS tablet Take 1 tablet (25 mg total) by mouth daily before breakfast. 01/13/24   Duanne Butler DASEN, MD  fluticasone  (FLONASE ) 50 MCG/ACT nasal spray PLACE 2 SPRAYS INTO THE NOSTRIL(S) DAILY 06/25/23   Duanne Butler DASEN, MD  ipratropium (ATROVENT ) 0.06 % nasal spray Place 2 sprays into both nostrils 4 (four) times daily. 06/15/18   Tapia, Leisa, PA-C  Lancets MISC Please dispense based on patient and insurance preference. Use as directed to monitor FSBS 1x daily. Dx: E11.9. 02/14/21   Duanne Butler DASEN, MD  levocetirizine (XYZAL ) 5 MG tablet TAKE 1 TABLET BY MOUTH EVERY DAY IN THE EVENING 06/16/23   Duanne Butler DASEN, MD  losartan  (COZAAR ) 100 MG tablet Take 1 tablet (100 mg total) by mouth daily. 08/02/23   Duanne Butler DASEN, MD  metFORMIN  (GLUCOPHAGE -XR) 500 MG 24 hr tablet TAKE 4 TABLETS BY MOUTH DAILY WITH BREAKFAST 01/28/24   Duanne Butler DASEN, MD  metoprolol  succinate (TOPROL -XL) 25 MG 24 hr tablet Take 1 tablet (25 mg total) by mouth daily. 01/31/24   Duanne Butler DASEN, MD  montelukast  (SINGULAIR ) 10 MG tablet TAKE 1 TABLET BY MOUTH EVERY DAY 01/31/24   Duanne Butler DASEN, MD  Multiple Vitamin (MULTIVITAMIN) tablet Take 1 tablet by mouth daily.    [provider]  niacin  (NIASPAN ) 1000 MG CR tablet TAKE 1 TABLET (1,000 MG TOTAL) BY MOUTH AT BEDTIME. 08/15/15   Duanne Butler DASEN, MD  triamcinolone  cream (KENALOG ) 0.1 % APPLY TO AFFECTED AREA TWICE A DAY 07/02/22   Duanne Butler DASEN, MD    Allergies  Allergen Reactions   Contrast Media [Iodinated Contrast Media]  Swelling    Allergic reaction 1987, ok w/ 13 hr prep//a.c.    Social History   Socioeconomic History   Marital status: Married    Spouse name: Not on file   Number of children: Not on file   Years of education: Not on file   Highest education level: Not on file  Occupational History   Occupation: heating and AC    Employer: [employer]  Tobacco Use   Smoking status: Former    Current packs/day: 0.00    Types: Cigarettes    Quit date: 02/26/2020    Years since quitting: 3.9   Smokeless tobacco: Never  Substance and Sexual Activity   Alcohol use: Yes   Drug use: No   Sexual activity: Not on file  Other Topics Concern   Not on file  Social History Narrative  He is married.  He works in Production assistant, radio.  He smoked 1 pack of cigarettes a day for the last 35     years.  He has occasional alcohol use.    Social Drivers of Corporate investment banker Strain: Low Risk  (01/05/2024)   Overall Financial Resource Strain (CARDIA)    Difficulty of Paying Living Expenses: Not hard at all  Food Insecurity: No Food Insecurity (01/05/2024)   Hunger Vital Sign    Worried About Running Out of Food in the Last Year: Never true    Ran Out of Food in the Last Year: Never true  Transportation Needs: No Transportation Needs (01/05/2024)   PRAPARE - Administrator, Civil Service (Medical): No    Lack of Transportation (Non-Medical): No  Physical Activity: Insufficiently Active (01/05/2024)   Exercise Vital Sign    Days of Exercise per Week: 3 days    Minutes of Exercise per Session: 30 min  Stress: No Stress Concern Present (01/05/2024)   [employer] of Occupational Health - Occupational Stress Questionnaire    Feeling of Stress: Not at all  Social Connections: Socially Integrated (01/05/2024)   Social Connection and Isolation Panel    Frequency of Communication with Friends and Family: More than three times a week    Frequency of Social Gatherings with  Friends and Family: Never    Attends Religious Services: More than 4 times per year    Active Member of [employer] or Organizations: Yes    Attends Engineer, structural: More than 4 times per year    Marital Status: Married  Catering manager Violence: Not At Risk (01/05/2024)   Humiliation, Afraid, Rape, and Kick questionnaire    Fear of Current or Ex-Partner: No    Emotionally Abused: No    Physically Abused: No    Sexually Abused: No    Tobacco Use: Medium Risk (01/10/2024)   Patient History    Smoking Tobacco Use: Former    Smokeless Tobacco Use: Never    Passive Exposure: Not on file   Social History   Substance and Sexual Activity  Alcohol Use Yes    Family History  Problem Relation Age of Onset   Coronary artery disease Father    Allergic rhinitis Father    Heart disease Father    Coronary artery disease Mother    Allergic rhinitis Mother    Heart disease Mother    Kidney disease Mother    Stroke Mother     ROS  Objective:  Physical Exam:  Constitutional: Well-developed male, alert and oriented, in no apparent distress.   Musculoskeletal:   - Hips: Normal range of motion with no discomfort.   - Right knee: No effusion, slight varus. Range of motion 5-130 with crepitus on range of motion. Tenderness medially. No lateral tenderness or instability noted.   - Left knee: No effusion. Range of motion 0=135 with crepitus on range of motion. Slight tenderness medially. No lateral tenderness or instability noted.   - Gait: Slightly antalgic on the right.    IMAGING:   Radiographs of both knees, including AP and lateral views, demonstrate bone-on-bone arthritis in the medial compartment and near bone-on-bone patellofemoral arthritis on the right. The left knee shows near bone-on-bone arthritis in the medial compartment and narrowing in the patellofemoral region  Assessment/Plan:  End stage arthritis, right knee   The patient history, physical examination,  clinical judgment of the provider and  imaging studies are consistent with end stage degenerative joint disease of the right knee and total knee arthroplasty is deemed medically necessary. The treatment options including medical management, injection therapy arthroscopy and arthroplasty were discussed at length. The risks and benefits of total knee arthroplasty were presented and reviewed. The risks due to aseptic loosening, infection, stiffness, patella tracking problems, thromboembolic complications and other imponderables were discussed. The patient acknowledged the explanation, agreed to proceed with the plan and consent was signed. Patient is being admitted for inpatient treatment for surgery, pain control, PT, OT, prophylactic antibiotics, VTE prophylaxis, progressive ambulation and ADLs and discharge planning. The patient is planning to be discharged home.   Patient's anticipated LOS is less than 2 midnights, meeting these requirements: - Younger than 40 - Lives within 1 hour of care - Has a competent adult at home to recover with post-op recover - NO history of  - Chronic pain requiring opiods  - Coronary Artery Disease  - Heart failure  - Heart attack  - Stroke  - DVT/VTE  - Cardiac arrhythmia  - Respiratory Failure/COPD  - Renal failure  - Anemia  - Advanced Liver disease   Therapy Plans: DOAR Disposition: Home with Wife Planned DVT Prophylaxis: Aspirin 81mg  BID  DME Needed: None PCP: Butler ONEIDA Burr II MD (clearance pending** TXA: IV Allergies: iodine (facial swelling, tolerates contrast with benadryl) Anesthesia Concerns: None BMI: 26.4 Last HgbA1c: 6.5% on 01/11/24 Pain Regimen; Oxycodone , tramadol  [facility] [place] [place]  - Patient was instructed on what medications to stop prior to surgery. - Follow-up visit in 2 weeks with Dr. Melodi - Begin physical therapy following surgery - Pre-operative lab work as pre-surgical testing - Prescriptions will be provided in  hospital at time of discharge  Corean Sender, PA-C Orthopedic Surgery [facility] [place]

## 2024-02-12 DIAGNOSIS — J449 Chronic obstructive pulmonary disease, unspecified: Secondary | ICD-10-CM | POA: Diagnosis not present

## 2024-02-12 DIAGNOSIS — Z87891 Personal history of nicotine dependence: Secondary | ICD-10-CM | POA: Diagnosis not present

## 2024-02-12 DIAGNOSIS — R918 Other nonspecific abnormal finding of lung field: Secondary | ICD-10-CM | POA: Diagnosis not present

## 2024-02-12 DIAGNOSIS — R059 Cough, unspecified: Secondary | ICD-10-CM | POA: Diagnosis not present

## 2024-02-12 DIAGNOSIS — R042 Hemoptysis: Secondary | ICD-10-CM | POA: Diagnosis not present

## 2024-02-12 DIAGNOSIS — Z91041 Radiographic dye allergy status: Secondary | ICD-10-CM | POA: Diagnosis not present

## 2024-02-12 DIAGNOSIS — J439 Emphysema, unspecified: Secondary | ICD-10-CM | POA: Diagnosis not present

## 2024-02-15 ENCOUNTER — Ambulatory Visit: Payer: Self-pay

## 2024-02-15 NOTE — Progress Notes (Addendum)
 COVID Vaccine received:  []  No [x]  Yes Date of any COVID positive Test in last 90 days: no PCP - Dr. Butler Burr in [facility] Med. Cardiologist - N/a  Chest x-ray -  EKG -   Stress Test - 09/09/10 Epic ECHO - 12/15/17 Epic Cardiac Cath - 07/05/02 Epic  Bowel Prep - [x]  No  []   Yes ______  Pacemaker / ICD device [x]  No []  Yes   Spinal Cord Stimulator:[x]  No []  Yes       History of Sleep Apnea? []  No [x]  Yes   CPAP used?- []  No [x]  Yes    Does the patient monitor blood sugar?          []  No [x]  Yes  []  N/A  Patient has: []  NO Hx DM   []  Pre-DM                 []  DM1  [x]   DM2 Does patient have a [employer] or Dexacom? [x]  No []  Yes   Fasting Blood Sugar Ranges- 80-110 Checks Blood Sugar __3___ times a week  GLP1 agonist / usual dose - no GLP1 instructions:  SGLT-2 inhibitors / usual dose - no SGLT-2 instructions:   Blood Thinner / Instructions:no Aspirin Instructions:no  Comments:   Activity level: Patient is able to climb a flight of stairs without difficulty; [x]  No CP  [x]  No SOB,    Patient can  perform ADLs without assistance.   Anesthesia review:   Patient denies shortness of breath, fever, cough and chest pain at PAT appointment.  Patient verbalized understanding and agreement to the Pre-Surgical Instructions that were given to them at this PAT appointment. Patient was also educated of the need to review these PAT instructions again prior to his/her surgery.I reviewed the appropriate phone numbers to call if they have any and questions or concerns.

## 2024-02-15 NOTE — Patient Instructions (Signed)
 SURGICAL WAITING ROOM VISITATION  Patients having surgery or a procedure may have no more than 2 support people in the waiting area - these visitors may rotate.    Children under the age of 27 must have an adult with them who is not the patient.  Visitors with respiratory illnesses are discouraged from visiting and should remain at home.  If the patient needs to stay at the hospital during part of their recovery, the visitor guidelines for inpatient rooms apply. Pre-op nurse will coordinate an appropriate time for 1 support person to accompany patient in pre-op.  This support person may not rotate.    Please refer to the [facility] website for the visitor guidelines for Inpatients (after your surgery is over and you are in a regular room).       Your procedure is scheduled on: 02/28/24   Report to [facility] Main Entrance    Report to admitting at 7 AM   Call this number if you have problems the morning of surgery [phone]   Do not eat food :After Midnight.   After Midnight you may have the following liquids until 6:25 AM DAY OF SURGERY  Water Non-Citrus Juices (without pulp, NO RED-Apple, White grape, White cranberry) Black Coffee (NO MILK/CREAM OR CREAMERS, sugar ok)  Clear Tea (NO MILK/CREAM OR CREAMERS, sugar ok) regular and decaf                             Plain Jell-O (NO RED)                                           Fruit ices (not with fruit pulp, NO RED)                                     Popsicles (NO RED)                                                               Sports drinks like Gatorade (NO RED)                   The day of surgery:  Drink ONE (1) Pre-Surgery G2 at 6:25 AM the morning of surgery. Drink in one sitting. Do not sip.  This drink was given to you during your hospital  pre-op appointment visit. Nothing else to drink after completing the  Pre-Surgery  G2.      Oral Hygiene is also important to reduce your risk of infection.                                     Remember - BRUSH YOUR TEETH THE MORNING OF SURGERY WITH YOUR REGULAR TOOTHPASTE  DENTURES WILL BE REMOVED PRIOR TO SURGERY PLEASE DO NOT APPLY Poly grip OR ADHESIVES!!!   Do NOT smoke after Midnight   Stop all vitamins and herbal supplements 7 days before surgery.   Take these medicines the morning of surgery with  A SIP OF WATER: inhaler, amlodipine , atorvastatin , nasal spray, levocetirizine, metoprolol , montelukast .               Do not take losartan  the morning of surgery.  DO NOT TAKE ANY ORAL DIABETIC MEDICATIONS DAY OF YOUR SURGERY Hold Metformin  the morning of surgery. Hold Jardiance  72hrs prior to surgery. Last dose 02/24/24  Bring CPAP mask and tubing day of surgery.                              You may not have any metal on your body including hair pins, jewelry, and body piercing             Do not wear make-up, lotions, powders, perfumes/cologne, or deodorant              Men may shave face and neck.   Do not bring valuables to the hospital. [facility] IS NOT             RESPONSIBLE   FOR VALUABLES.   Contacts, glasses, dentures or bridgework may not be worn into surgery.   Bring small overnight bag day of surgery.   DO NOT BRING YOUR HOME MEDICATIONS TO THE HOSPITAL. PHARMACY WILL DISPENSE MEDICATIONS LISTED ON YOUR MEDICATION LIST TO YOU DURING YOUR ADMISSION IN THE HOSPITAL!    Patients discharged on the day of surgery will not be allowed to drive home.  Someone NEEDS to stay with you for the first 24 hours after anesthesia.   Special Instructions: Bring a copy of your healthcare power of attorney and living will documents the day of surgery if you haven't scanned them before.              Please read over the following fact sheets you were given: IF YOU HAVE QUESTIONS ABOUT YOUR PRE-OP INSTRUCTIONS PLEASE [phone] [place]   If you received a COVID test during your pre-op visit  it is requested that you wear a mask  when out in public, stay away from anyone that may not be feeling well and notify your surgeon if you develop symptoms. If you test positive for Covid or have been in contact with anyone that has tested positive in the last 10 days please notify you surgeon.      Pre-operative 4 CHG Bath Instructions  DYNA-Hex 4 Chlorhexidine Gluconate 4% Solution Antiseptic 4 fl. oz   You can play a key role in reducing the risk of infection after surgery. Your skin needs to be as free of germs as possible. You can reduce the number of germs on your skin by washing with CHG (chlorhexidine gluconate) soap before surgery. CHG is an antiseptic soap that kills germs and continues to kill germs even after washing.   DO NOT use if you have an allergy to chlorhexidine/CHG or antibacterial soaps. If your skin becomes reddened or irritated, stop using the CHG and notify one of our RNs at   Please shower with the CHG soap starting 4 days before surgery using the following schedule:     Please keep in mind the following:  DO NOT shave, including legs and underarms, starting the day of your first shower.   You may shave your face at any point before/day of surgery.  Place clean sheets on your bed the day you start using CHG soap. Use a clean washcloth (not used since being washed) for each shower. DO NOT sleep with pets once  you start using the CHG.  CHG Shower Instructions:  If you choose to wash your hair and private area, wash first with your normal shampoo/soap.  After you use shampoo/soap, rinse your hair and body thoroughly to remove shampoo/soap residue.  Turn the water OFF and apply about 3 tablespoons (45 ml) of CHG soap to a CLEAN washcloth.  Apply CHG soap ONLY FROM YOUR NECK DOWN TO YOUR TOES (washing for 3-5 minutes)  DO NOT use CHG soap on face, private areas, open wounds, or sores.  Pay special attention to the area where your surgery is being performed.  If you are having back surgery, having  someone wash your back for you may be helpful. Wait 2 minutes after CHG soap is applied, then you may rinse off the CHG soap.  Pat dry with a clean towel  Put on clean clothes/pajamas   If you choose to wear lotion, please use ONLY the CHG-compatible lotions on the back of this paper.     Additional instructions for the day of surgery: DO NOT APPLY any lotions, deodorants, cologne, or perfumes.   Put on clean/comfortable clothes.  Brush your teeth.  Ask your nurse before applying any prescription medications to the skin.   CHG Compatible Lotions   Aveeno Moisturizing lotion  Cetaphil Moisturizing Cream  Cetaphil Moisturizing Lotion  Clairol Herbal Essence Moisturizing Lotion, Dry Skin  Clairol Herbal Essence Moisturizing Lotion, Extra Dry Skin  Clairol Herbal Essence Moisturizing Lotion, Normal Skin  Curel Age Defying Therapeutic Moisturizing Lotion with Alpha Hydroxy  Curel Extreme Care Body Lotion  Curel Soothing Hands Moisturizing Hand Lotion  Curel Therapeutic Moisturizing Cream, Fragrance-Free  Curel Therapeutic Moisturizing Lotion, Fragrance-Free  Curel Therapeutic Moisturizing Lotion, Original Formula  Eucerin Daily Replenishing Lotion  Eucerin Dry Skin Therapy Plus Alpha Hydroxy Crme  Eucerin Dry Skin Therapy Plus Alpha Hydroxy Lotion  Eucerin Original Crme  Eucerin Original Lotion  Eucerin Plus Crme Eucerin Plus Lotion  Eucerin TriLipid Replenishing Lotion  Keri Anti-Bacterial Hand Lotion  Keri Deep Conditioning Original Lotion Dry Skin Formula Softly Scented  Keri Deep Conditioning Original Lotion, Fragrance Free Sensitive Skin Formula  Keri Lotion Fast Absorbing Fragrance Free Sensitive Skin Formula  Keri Lotion Fast Absorbing Softly Scented Dry Skin Formula  Keri Original Lotion  Keri Skin Renewal Lotion Keri Silky Smooth Lotion  Keri Silky Smooth Sensitive Skin Lotion  Nivea Body Creamy Conditioning Oil  Nivea Body Extra Enriched Lotion  Nivea Body Original  Lotion  Nivea Body Sheer Moisturizing Lotion Nivea Crme  Nivea Skin Firming Lotion  NutraDerm 30 Skin Lotion  NutraDerm Skin Lotion  NutraDerm Therapeutic Skin Cream  NutraDerm Therapeutic Skin Lotion  ProShield Protective Hand Cream  Incentive Spirometer (Watch this video at home: [email or web address])  An incentive spirometer is a tool that can help keep your lungs clear and active. This tool measures how well you are filling your lungs with each breath. Taking long deep breaths may help reverse or decrease the chance of developing breathing (pulmonary) problems (especially infection) following: A long period of time when you are unable to move or be active. BEFORE THE PROCEDURE  If the spirometer includes an indicator to show your best effort, your nurse or respiratory therapist will set it to a desired goal. If possible, sit up straight or lean slightly forward. Try not to slouch. Hold the incentive spirometer in an upright position. INSTRUCTIONS FOR USE  Sit on the edge of your bed if possible, or sit up as far as you  can in bed or on a chair. Hold the incentive spirometer in an upright position. Breathe out normally. Place the mouthpiece in your mouth and seal your lips tightly around it. Breathe in slowly and as deeply as possible, raising the piston or the ball toward the top of the column. Hold your breath for 3-5 seconds or for as long as possible. Allow the piston or ball to fall to the bottom of the column. Remove the mouthpiece from your mouth and breathe out normally. Rest for a few seconds and repeat Steps 1 through 7 at least 10 times every 1-2 hours when you are awake. Take your time and take a few normal breaths between deep breaths. The spirometer may include an indicator to show your best effort. Use the indicator as a goal to work toward during each repetition. After each set of 10 deep breaths, practice coughing to be sure your lungs are  clear. If you have an incision (the cut made at the time of surgery), support your incision when coughing by placing a pillow or rolled up towels firmly against it. Once you are able to get out of bed, walk around indoors and cough well. You may stop using the incentive spirometer when instructed by your caregiver.  RISKS AND COMPLICATIONS Take your time so you do not get dizzy or light-headed. If you are in pain, you may need to take or ask for pain medication before doing incentive spirometry. It is harder to take a deep breath if you are having pain. AFTER USE Rest and breathe slowly and easily. It can be helpful to keep track of a log of your progress. Your caregiver can provide you with a simple table to help with this. If you are using the spirometer at home, follow these instructions: SEEK MEDICAL CARE IF:  You are having difficultly using the spirometer. You have trouble using the spirometer as often as instructed. Your pain medication is not giving enough relief while using the spirometer. You develop fever of 100.5 F (38.1 C) or higher. SEEK IMMEDIATE MEDICAL CARE IF:  You cough up bloody sputum that had not been present before. You develop fever of 102 F (38.9 C) or greater. You develop worsening pain at or near the incision site. MAKE SURE YOU:  Understand these instructions. Will watch your condition. Will get help right away if you are not doing well or get worse. Document Released: 08/24/2006 Document Revised: 07/06/2011 Document Reviewed: 10/25/2006 [facility] Patient Information 2014 [place], [place].

## 2024-02-15 NOTE — Telephone Encounter (Signed)
 Pt was seen in the ED over the weekend, was dx with emphysema. Pt was advised to f/u with PCP. Pt states that he is not currently spitting up blood. Pt states that he has no medical concerns at this time, he does not feel sick, denies SOB at this time.  Pt states that he would like an appointment with PCP.    Copied from CRM [id number]. Topic: Clinical - Red Word Triage >> Feb 15, 2024  2:17 PM Winona R wrote: Spitting up blood. [facility] and was told he had emphysema. and hole in his lungs. Need hospital follow up or urgent acute visit

## 2024-02-16 ENCOUNTER — Encounter (HOSPITAL_COMMUNITY): Payer: Self-pay

## 2024-02-16 ENCOUNTER — Other Ambulatory Visit: Payer: Self-pay

## 2024-02-16 ENCOUNTER — Encounter (HOSPITAL_COMMUNITY)
Admission: RE | Admit: 2024-02-16 | Discharge: 2024-02-16 | Disposition: A | Discharge status: Home / Self Care | Source: Ambulatory Visit | Attending: Orthopedic Surgery | Admitting: Orthopedic Surgery

## 2024-02-16 VITALS — BP 125/88 | HR 80 | Temp 97.7°F | Resp 16 | Ht 72.0 in | Wt 191.0 lb

## 2024-02-16 DIAGNOSIS — I10 Essential (primary) hypertension: Secondary | ICD-10-CM | POA: Insufficient documentation

## 2024-02-16 DIAGNOSIS — E119 Type 2 diabetes mellitus without complications: Secondary | ICD-10-CM | POA: Insufficient documentation

## 2024-02-16 DIAGNOSIS — Z01818 Encounter for other preprocedural examination: Secondary | ICD-10-CM | POA: Insufficient documentation

## 2024-02-16 HISTORY — DX: Unspecified osteoarthritis, unspecified site: M19.90

## 2024-02-16 LAB — CBC
HCT: 50.3 % (ref 39.0–52.0)
Hemoglobin: 16.2 g/dL (ref 13.0–17.0)
MCH: 30.9 pg (ref 26.0–34.0)
MCHC: 32.2 g/dL (ref 30.0–36.0)
MCV: 96 fL (ref 80.0–100.0)
Platelets: 285 K/uL (ref 150–400)
RBC: 5.24 MIL/uL (ref 4.22–5.81)
RDW: 13.2 % (ref 11.5–15.5)
WBC: 15 K/uL — ABNORMAL HIGH (ref 4.0–10.5)
nRBC: 0 % (ref 0.0–0.2)

## 2024-02-16 LAB — BASIC METABOLIC PANEL WITH GFR
Anion gap: 12 (ref 5–15)
BUN: 26 mg/dL — ABNORMAL HIGH (ref 8–23)
CO2: 22 mmol/L (ref 22–32)
Calcium: 9.8 mg/dL (ref 8.9–10.3)
Chloride: 106 mmol/L (ref 98–111)
Creatinine, Ser: 0.87 mg/dL (ref 0.61–1.24)
GFR, Estimated: >60 mL/min (ref >60)
Glucose, Bld: 146 mg/dL — ABNORMAL HIGH (ref 70–99)
Potassium: 4.4 mmol/L (ref 3.5–5.1)
Sodium: 140 mmol/L (ref 135–145)

## 2024-02-16 LAB — GLUCOSE, CAPILLARY: Glucose-Capillary: 145 mg/dL — ABNORMAL HIGH (ref 70–99)

## 2024-02-16 LAB — SURGICAL PCR SCREEN
MRSA, PCR: NEGATIVE
Staphylococcus aureus: NEGATIVE

## 2024-02-17 ENCOUNTER — Encounter: Payer: Self-pay | Admitting: Family Medicine

## 2024-02-17 ENCOUNTER — Ambulatory Visit (INDEPENDENT_AMBULATORY_CARE_PROVIDER_SITE_OTHER): Admitting: Family Medicine

## 2024-02-17 ENCOUNTER — Ambulatory Visit: Admitting: Family Medicine

## 2024-02-17 VITALS — BP 128/68 | HR 85 | Temp 98.6°F | Ht 72.0 in | Wt 200.0 lb

## 2024-02-17 DIAGNOSIS — Z1211 Encounter for screening for malignant neoplasm of colon: Secondary | ICD-10-CM | POA: Diagnosis not present

## 2024-02-17 DIAGNOSIS — R042 Hemoptysis: Secondary | ICD-10-CM | POA: Diagnosis not present

## 2024-02-17 NOTE — Progress Notes (Signed)
 Subjective:    Patient ID: Vincent Short, male    DOB: [birth year], 73 y.o.   MRN: [id number]  HPI Patient was recently working at his cabin.  He spit and saw it was.  Bright red blood.  He denies any cough.  He states that he was not throwing up.  Instead his mouth was feeling with blood.  He had to spit several times before all the blood evacuated.  They were unable to see any source of bleeding in his mouth.  His wife states that his throat however had blood obscuring her view in the peritonsillar folds and around the uvula.  Patient went to a local emergency room in [place]  where a CT scan of the chest showed mild emphysema but was otherwise unremarkable.  CBC was unremarkable.  Bleeding stopped spontaneously.  Patient was recommended to see pulmonology for a bronchoscopy.  He was placed on prednisone  and Z-Pak by the emergency room physician.  Labs below are labs drawn yesterday for preoperative evaluation  Hospital Outpatient Visit on 02/16/2024  Component Date Value Ref Range Status   Glucose-Capillary 02/16/2024 145 (H)  70 - 99 mg/dL Final   Glucose reference range applies only to samples taken after fasting for at least 8 hours.   Sodium 02/16/2024 140  135 - 145 mmol/L Final   Potassium 02/16/2024 4.4  3.5 - 5.1 mmol/L Final   Chloride 02/16/2024 106  98 - 111 mmol/L Final   CO2 02/16/2024 22  22 - 32 mmol/L Final   Glucose, Bld 02/16/2024 146 (H)  70 - 99 mg/dL Final   Glucose reference range applies only to samples taken after fasting for at least 8 hours.   BUN 02/16/2024 26 (H)  8 - 23 mg/dL Final   Creatinine, Ser 02/16/2024 0.87  0.61 - 1.24 mg/dL Final   Calcium  02/16/2024 9.8  8.9 - 10.3 mg/dL Final   GFR, Estimated 02/16/2024 >60  >60 mL/min Final   Comment: (NOTE) Calculated using the CKD-EPI Creatinine Equation (2021)    Anion gap 02/16/2024 12  5 - 15 Final   Performed at [facility], [address] [place], [place] [address]   WBC 02/16/2024  15.0 (H)  4.0 - 10.5 K/uL Final   RBC 02/16/2024 5.24  4.22 - 5.81 MIL/uL Final   Hemoglobin 02/16/2024 16.2  13.0 - 17.0 g/dL Final   HCT 89/77/7974 50.3  39.0 - 52.0 % Final   MCV 02/16/2024 96.0  80.0 - 100.0 fL Final   MCH 02/16/2024 30.9  26.0 - 34.0 pg Final   MCHC 02/16/2024 32.2  30.0 - 36.0 g/dL Final   RDW 89/77/7974 13.2  11.5 - 15.5 % Final   Platelets 02/16/2024 285  150 - 400 K/uL Final   nRBC 02/16/2024 0.0  0.0 - 0.2 % Final   Performed at [facility], [address], [place], [place] [address]   MRSA, PCR 02/16/2024 NEGATIVE  NEGATIVE Final   Staphylococcus aureus 02/16/2024 NEGATIVE  NEGATIVE Final   Comment: (NOTE) The Xpert SA Assay (FDA approved for NASAL specimens in patients 33 years of age and older), is one component of a comprehensive surveillance program. It is not intended to diagnose infection nor to guide or monitor treatment. Performed at [facility], [address] [place], [place] [address]     Past Medical History:  Diagnosis Date   Allergic rhinitis    Arthritis    Asthma    Atelectasis  Rt upper lobe   COPD (chronic obstructive pulmonary disease) (HCC)    GERD (gastroesophageal reflux disease)    History of colon polyps 03/14/2009   Tubular adenomatous and Hyperplastic polyps   HTN (hypertension)    Hyperlipidemia    Lung mass    noted on CT scan(12/24/10)    S/P cardiac cath    negative   Tobacco abuse    Treadmill stress test negative for angina pectoris    Dr. Burnard   Past Surgical History:  Procedure Laterality Date   Arthroscopy left wrist with radial styloidectomy.  07/06/2000   Dr Murrell   Bronchoscopy with airway inspection, brush cytology,  11/22/2006   Dr Shellia   CARDIAC CATHETERIZATION  07/05/2002   Dr Burnard SAXON LVF, SMOOTH 10% TO 20% OSTIAL AND 10%-20% LUMINAL IRREGULARITY OF THE PROXIMAL LAD, SMOOTH 30%-40% PROXIMAL RCA,WHICH IMPROVED FOLLOWING INTRACORONARY NTG ADMINISTRATION  SUGGESTING CORONARY VASOSPASM. NO AORTOILIAC DISEASE.   DOPPLER ECHOCARDIOGRAPHY N/A 06/22/08   LV SIZE IS NORMAL. LV FUNCTION IS NORMAL. EF=>55%. THERE IS BORDERLINE CONCENTRIC LVH.   FRACTURE SURGERY     Left wrist scaphoid fx   FRACTURE SURGERY     Left ankle surgery   Left ankle surgery  11/11/2005   Dr Jane   LUNG SURGERY  01/21/11   Dr. Lucas   FOB, R THORACOTOMY WITH RIGHT UPPER LOBECTOMY   LUNG SURGERY  01/21/11   FOB, R THORACOTOMY WITH RIGHT UPPER LOBECTOMY   NASAL SINUS SURGERY  1990   NUCLEAR STRESS TEST N/A 09/09/10   NORMAL PATTERN OF PERFUSION IN ALL REGIONS. EF IS 63%. NO WALL MOTION ABNORMALITIES.   ROTATOR CUFF REPAIR  pending   Dr. Jane   TONSILLECTOMY     Current Outpatient Medications on File Prior to Visit  Medication Sig Dispense Refill   ACCU-CHEK GUIDE test strip USE AS DIRECTED DAILY 100 strip 11   albuterol  (VENTOLIN  HFA) 108 (90 Base) MCG/ACT inhaler Inhale 2 puffs into the lungs every 6 (six) hours as needed for wheezing or shortness of breath. 1 each 5   amLODipine  (NORVASC ) 10 MG tablet Take 1 tablet (10 mg total) by mouth daily. 30 tablet 0   atorvastatin  (LIPITOR) 80 MG tablet Take 1 tablet (80 mg total) by mouth daily. 90 tablet 0   azithromycin  (ZITHROMAX ) 500 MG tablet Take 500 mg by mouth daily.     Blood Glucose Monitoring Suppl (BLOOD GLUCOSE SYSTEM PAK) KIT Please dispense based on patient and insurance preference. Use as directed to monitor FSBS 1x daily. Dx: E11.9. 1 kit 1   budesonide-formoterol (SYMBICORT) 160-4.5 MCG/ACT inhaler Inhale 2 puffs into the lungs 2 (two) times daily as needed (shortness of breath).     empagliflozin  (JARDIANCE ) 25 MG TABS tablet Take 1 tablet (25 mg total) by mouth daily before breakfast. 90 tablet 1   Lancets MISC Please dispense based on patient and insurance preference. Use as directed to monitor FSBS 1x daily. Dx: E11.9. 100 each 1   levocetirizine (XYZAL ) 5 MG tablet TAKE 1 TABLET BY MOUTH EVERY DAY IN THE  EVENING 90 tablet 3   losartan  (COZAAR ) 100 MG tablet Take 1 tablet (100 mg total) by mouth daily. 90 tablet 3   metFORMIN  (GLUCOPHAGE -XR) 500 MG 24 hr tablet TAKE 4 TABLETS BY MOUTH DAILY WITH BREAKFAST 360 tablet 0   metoprolol  succinate (TOPROL -XL) 25 MG 24 hr tablet Take 1 tablet (25 mg total) by mouth daily. 90 tablet 0   montelukast  (SINGULAIR ) 10 MG tablet TAKE  1 TABLET BY MOUTH EVERY DAY 90 tablet 2   Multiple Vitamin (MULTIVITAMIN) tablet Take 1 tablet by mouth daily.     predniSONE  (DELTASONE ) 20 MG tablet Take 40 mg by mouth daily.     fluticasone  (FLONASE ) 50 MCG/ACT nasal spray PLACE 2 SPRAYS INTO THE NOSTRIL(S) DAILY (Patient not taking: Reported on 02/17/2024) 48 mL 3   ipratropium (ATROVENT ) 0.06 % nasal spray Place 2 sprays into both nostrils 4 (four) times daily. (Patient not taking: Reported on 02/17/2024) 15 mL 12   niacin  (NIASPAN ) 1000 MG CR tablet TAKE 1 TABLET (1,000 MG TOTAL) BY MOUTH AT BEDTIME. (Patient not taking: Reported on 02/17/2024) 30 tablet 5   triamcinolone  cream (KENALOG ) 0.1 % APPLY TO AFFECTED AREA TWICE A DAY (Patient not taking: Reported on 02/17/2024) 30 g 0   No current facility-administered medications on file prior to visit.   Allergies  Allergen Reactions   Contrast Media [Iodinated Contrast Media] Swelling    Allergic reaction 1987, ok w/ 13 hr prep//a.c.   Social History   Socioeconomic History   Marital status: Married    Spouse name: Not on file   Number of children: Not on file   Years of education: Not on file   Highest education level: Not on file  Occupational History   Occupation: heating and AC    Employer: [employer]  Tobacco Use   Smoking status: Former    Current packs/day: 0.00    Types: Cigarettes    Quit date: 02/26/2020    Years since quitting: 3.9   Smokeless tobacco: Never  Vaping Use   Vaping status: Never Used  Substance and Sexual Activity   Alcohol use: Yes    Comment: occas   Drug use: No   Sexual  activity: Not on file  Other Topics Concern   Not on file  Social History Narrative    He is married.  He works in Production assistant, radio.  He smoked 1 pack of cigarettes a day for the last 35     years.  He has occasional alcohol use.    Social Drivers of Corporate investment banker Strain: Low Risk  (01/05/2024)   Overall Financial Resource Strain (CARDIA)    Difficulty of Paying Living Expenses: Not hard at all  Food Insecurity: No Food Insecurity (01/05/2024)   Hunger Vital Sign    Worried About Running Out of Food in the Last Year: Never true    Ran Out of Food in the Last Year: Never true  Transportation Needs: No Transportation Needs (01/05/2024)   PRAPARE - Administrator, Civil Service (Medical): No    Lack of Transportation (Non-Medical): No  Physical Activity: Insufficiently Active (01/05/2024)   Exercise Vital Sign    Days of Exercise per Week: 3 days    Minutes of Exercise per Session: 30 min  Stress: No Stress Concern Present (01/05/2024)   [employer] of Occupational Health - Occupational Stress Questionnaire    Feeling of Stress: Not at all  Social Connections: Socially Integrated (01/05/2024)   Social Connection and Isolation Panel    Frequency of Communication with Friends and Family: More than three times a week    Frequency of Social Gatherings with Friends and Family: Never    Attends Religious Services: More than 4 times per year    Active Member of [employer] or Organizations: Yes    Attends Banker Meetings: More than 4 times  per year    Marital Status: Married  Catering manager Violence: Not At Risk (01/05/2024)   Humiliation, Afraid, Rape, and Kick questionnaire    Fear of Current or Ex-Partner: No    Emotionally Abused: No    Physically Abused: No    Sexually Abused: No     Review of Systems  All other systems reviewed and are negative.      Objective:   Physical Exam Vitals reviewed.  Constitutional:       General: He is not in acute distress.    Appearance: Normal appearance. He is normal weight. He is not ill-appearing or toxic-appearing.  HENT:     Nose: No laceration, nasal tenderness, mucosal edema, congestion or rhinorrhea.     Right Nostril: No epistaxis.     Mouth/Throat:     Pharynx: Oropharynx is clear. No oropharyngeal exudate or posterior oropharyngeal erythema.  Eyes:     Conjunctiva/sclera: Conjunctivae normal.  Neck:     Thyroid : No thyromegaly.     Vascular: No JVD.  Cardiovascular:     Rate and Rhythm: Normal rate.     Heart sounds: Normal heart sounds. No murmur heard. Pulmonary:     Effort: Pulmonary effort is normal. No respiratory distress.     Breath sounds: Normal breath sounds. No wheezing or rales.  Abdominal:     General: Bowel sounds are normal. There is no distension.     Palpations: Abdomen is soft.     Tenderness: There is no abdominal tenderness. There is no rebound.  Musculoskeletal:     Cervical back: Neck supple.  Neurological:     Mental Status: He is alert.           Assessment & Plan:  Hemoptysis - Plan: Ambulatory referral to Pulmonology  Screen for colon cancer - Plan: Cologuard  Emergency room physician was concerned about hemoptysis however the patient states that he was not coughing.  He was also not vomiting.  Therefore there was the passive accumulation of blood in his mouth.  I believe that this was likely blood from the nasopharynx.  However this has spontaneously stopped.  Lab work yesterday showed no evidence of any severe anemia or life-threatening bleeding.  I will consult pulmonology however I feel if no further blood is seen, the patient does not require bronchoscopy.  If he does have passive accumulation of blood in his mouth again, I would recommend ENT consultation for fiberoptic evaluation of the nasopharynx.  Also recommended that he use Afrin 2 sprays each nostril twice daily for the next 3 days to try to prevent any  further bleeding from what I suspect is a nasopharyngeal source.  Schedule Cologuard for colon cancer screening

## 2024-02-27 NOTE — Anesthesia Preprocedure Evaluation (Signed)
 Anesthesia Evaluation  Patient identified by MRN, date of birth, ID band Patient awake    Reviewed: Allergy & Precautions, NPO status , Patient's Chart, lab work & pertinent test results, reviewed documented beta blocker date and time   History of Anesthesia Complications Negative for: history of anesthetic complications  Airway Mallampati: II  TM Distance: >3 FB Neck ROM: Full    Dental  (+) Missing,    Pulmonary sleep apnea , COPD,  COPD inhaler, former smoker   Pulmonary exam normal        Cardiovascular hypertension, Pt. on medications and Pt. on home beta blockers + CAD  Normal cardiovascular exam     Neuro/Psych negative neurological ROS     GI/Hepatic Neg liver ROS,GERD  ,,  Endo/Other  diabetes, Type 2, Oral Hypoglycemic Agents    Renal/GU negative Renal ROS     Musculoskeletal  (+) Arthritis ,    Abdominal   Peds  Hematology negative hematology ROS (+)   Anesthesia Other Findings   Reproductive/Obstetrics                              Anesthesia Physical Anesthesia Plan  ASA: 2  Anesthesia Plan: Spinal   Post-op Pain Management: Tylenol  PO (pre-op)* and Regional block*   Induction:   PONV Risk Score and Plan: 2 and Treatment may vary due to age or medical condition, Ondansetron , Propofol infusion, Dexamethasone  and Midazolam  Airway Management Planned: Natural Airway and Simple Face Mask  Additional Equipment: None  Intra-op Plan:   Post-operative Plan:   Informed Consent: I have reviewed the patients History and Physical, chart, labs and discussed the procedure including the risks, benefits and alternatives for the proposed anesthesia with the patient or authorized representative who has indicated his/her understanding and acceptance.       Plan Discussed with: CRNA  Anesthesia Plan Comments:          Anesthesia Quick Evaluation

## 2024-02-28 ENCOUNTER — Encounter (HOSPITAL_COMMUNITY)
Admission: RE | Disposition: A | Discharge status: Home / Self Care | Payer: Self-pay | Source: Home / Self Care | Attending: Orthopedic Surgery

## 2024-02-28 ENCOUNTER — Other Ambulatory Visit: Payer: Self-pay

## 2024-02-28 ENCOUNTER — Ambulatory Visit (HOSPITAL_COMMUNITY): Payer: Self-pay | Admitting: Anesthesiology

## 2024-02-28 ENCOUNTER — Ambulatory Visit (HOSPITAL_COMMUNITY): Payer: Self-pay | Admitting: Physician Assistant

## 2024-02-28 ENCOUNTER — Observation Stay (HOSPITAL_COMMUNITY)
Admission: RE | Admit: 2024-02-28 | Discharge: 2024-02-29 | Disposition: A | Discharge status: Home / Self Care | Attending: Orthopedic Surgery | Admitting: Orthopedic Surgery

## 2024-02-28 ENCOUNTER — Encounter (HOSPITAL_COMMUNITY): Payer: Self-pay | Admitting: Orthopedic Surgery

## 2024-02-28 DIAGNOSIS — Z7982 Long term (current) use of aspirin: Secondary | ICD-10-CM | POA: Insufficient documentation

## 2024-02-28 DIAGNOSIS — M1711 Unilateral primary osteoarthritis, right knee: Secondary | ICD-10-CM | POA: Diagnosis not present

## 2024-02-28 DIAGNOSIS — Z87891 Personal history of nicotine dependence: Secondary | ICD-10-CM | POA: Insufficient documentation

## 2024-02-28 DIAGNOSIS — Z79899 Other long term (current) drug therapy: Secondary | ICD-10-CM | POA: Insufficient documentation

## 2024-02-28 DIAGNOSIS — G8918 Other acute postprocedural pain: Secondary | ICD-10-CM | POA: Diagnosis not present

## 2024-02-28 DIAGNOSIS — E119 Type 2 diabetes mellitus without complications: Secondary | ICD-10-CM | POA: Diagnosis not present

## 2024-02-28 DIAGNOSIS — I10 Essential (primary) hypertension: Secondary | ICD-10-CM | POA: Insufficient documentation

## 2024-02-28 DIAGNOSIS — I2510 Atherosclerotic heart disease of native coronary artery without angina pectoris: Secondary | ICD-10-CM | POA: Diagnosis not present

## 2024-02-28 DIAGNOSIS — M179 Osteoarthritis of knee, unspecified: Principal | ICD-10-CM | POA: Diagnosis present

## 2024-02-28 DIAGNOSIS — F1090 Alcohol use, unspecified, uncomplicated: Secondary | ICD-10-CM | POA: Insufficient documentation

## 2024-02-28 DIAGNOSIS — J449 Chronic obstructive pulmonary disease, unspecified: Secondary | ICD-10-CM | POA: Diagnosis not present

## 2024-02-28 LAB — GLUCOSE, CAPILLARY
Glucose-Capillary: 172 mg/dL — ABNORMAL HIGH (ref 70–99)
Glucose-Capillary: 147 mg/dL — ABNORMAL HIGH (ref 70–99)
Glucose-Capillary: 138 mg/dL — ABNORMAL HIGH (ref 70–99)
Glucose-Capillary: 262 mg/dL — ABNORMAL HIGH (ref 70–99)
Glucose-Capillary: 200 mg/dL — ABNORMAL HIGH (ref 70–99)

## 2024-02-28 SURGERY — ARTHROPLASTY, KNEE, TOTAL
Anesthesia: Spinal | Site: Knee | Laterality: Right

## 2024-02-28 MED ORDER — SODIUM CHLORIDE 0.9 % IV SOLN
INTRAVENOUS | Status: DC
Start: 2024-02-28 — End: 2024-02-29

## 2024-02-28 MED ORDER — LACTATED RINGERS IV SOLN: INTRAVENOUS | Status: DC | PRN

## 2024-02-28 MED ORDER — MENTHOL 3 MG MT LOZG: 1.0000 | LOZENGE | OROMUCOSAL | Status: DC | PRN

## 2024-02-28 MED ORDER — CLONIDINE HCL (ANALGESIA) 100 MCG/ML EP SOLN
EPIDURAL | Status: DC | PRN
  Administered 2024-02-28: 100 ug

## 2024-02-28 MED ORDER — EMPAGLIFLOZIN 25 MG PO TABS
25.0000 mg | ORAL_TABLET | Freq: Every day | ORAL | Status: DC
  Administered 2024-02-29: 25 mg via ORAL
  Filled 2024-02-28: qty 1

## 2024-02-28 MED ORDER — FLUTICASONE FUROATE-VILANTEROL 200-25 MCG/ACT IN AEPB: 1.0000 | INHALATION_SPRAY | Freq: Every day | RESPIRATORY_TRACT | Status: DC

## 2024-02-28 MED ORDER — ACETAMINOPHEN 10 MG/ML IV SOLN
1000.0000 mg | Freq: Four times a day (QID) | INTRAVENOUS | Status: DC
  Administered 2024-02-28: 1000 mg via INTRAVENOUS
  Filled 2024-02-28: qty 100

## 2024-02-28 MED ORDER — PROPOFOL 10 MG/ML IV BOLUS
INTRAVENOUS | Status: DC | PRN
  Administered 2024-02-28: 30 mg via INTRAVENOUS
  Administered 2024-02-28: 20 mg via INTRAVENOUS

## 2024-02-28 MED ORDER — METOCLOPRAMIDE HCL 5 MG/ML IJ SOLN: 5.0000 mg | Freq: Three times a day (TID) | INTRAMUSCULAR | Status: DC | PRN

## 2024-02-28 MED ORDER — METOCLOPRAMIDE HCL 5 MG PO TABS: 5.0000 mg | ORAL_TABLET | Freq: Three times a day (TID) | ORAL | Status: DC | PRN

## 2024-02-28 MED ORDER — ONDANSETRON HCL 4 MG/2ML IJ SOLN: 4.0000 mg | Freq: Four times a day (QID) | INTRAMUSCULAR | Status: DC | PRN

## 2024-02-28 MED ORDER — FLEET ENEMA RE ENEM: 1.0000 | ENEMA | Freq: Once | RECTAL | Status: DC | PRN

## 2024-02-28 MED ORDER — INSULIN ASPART 100 UNIT/ML IJ SOLN: 0.0000 [IU] | Freq: Every day | INTRAMUSCULAR | Status: DC

## 2024-02-28 MED ORDER — PROPOFOL 500 MG/50ML IV EMUL
INTRAVENOUS | Status: DC | PRN
  Administered 2024-02-28: 140 ug/kg/min via INTRAVENOUS

## 2024-02-28 MED ORDER — CHLORHEXIDINE GLUCONATE 0.12 % MT SOLN
15.0000 mL | Freq: Once | OROMUCOSAL | Status: AC
  Administered 2024-02-28: 15 mL via OROMUCOSAL

## 2024-02-28 MED ORDER — METHOCARBAMOL 1000 MG/10ML IJ SOLN
500.0000 mg | Freq: Four times a day (QID) | INTRAMUSCULAR | Status: DC | PRN
Start: 2024-02-28 — End: 2024-02-29

## 2024-02-28 MED ORDER — CEFAZOLIN SODIUM-DEXTROSE 2-4 GM/100ML-% IV SOLN
2.0000 g | Freq: Four times a day (QID) | INTRAVENOUS | Status: AC
  Administered 2024-02-28 (×2): 2 g via INTRAVENOUS
  Filled 2024-02-28 (×2): qty 100

## 2024-02-28 MED ORDER — ONDANSETRON HCL 4 MG PO TABS: 4.0000 mg | ORAL_TABLET | Freq: Four times a day (QID) | ORAL | Status: DC | PRN

## 2024-02-28 MED ORDER — INSULIN ASPART 100 UNIT/ML IJ SOLN: 0.0000 [IU] | INTRAMUSCULAR | Status: DC | PRN

## 2024-02-28 MED ORDER — ACETAMINOPHEN 325 MG PO TABS: 325.0000 mg | ORAL_TABLET | Freq: Four times a day (QID) | ORAL | Status: DC | PRN

## 2024-02-28 MED ORDER — PHENOL 1.4 % MT LIQD: 1.0000 | OROMUCOSAL | Status: DC | PRN

## 2024-02-28 MED ORDER — ACETAMINOPHEN 500 MG PO TABS
1000.0000 mg | ORAL_TABLET | Freq: Four times a day (QID) | ORAL | Status: AC
  Administered 2024-02-28 – 2024-02-29 (×4): 1000 mg via ORAL
  Filled 2024-02-28 (×4): qty 2

## 2024-02-28 MED ORDER — FENTANYL CITRATE (PF) 50 MCG/ML IJ SOSY
50.0000 ug | PREFILLED_SYRINGE | INTRAMUSCULAR | Status: DC
  Administered 2024-02-28: 50 ug via INTRAVENOUS
  Filled 2024-02-28: qty 2

## 2024-02-28 MED ORDER — BUPIVACAINE IN DEXTROSE 0.75-8.25 % IT SOLN
INTRATHECAL | Status: DC | PRN
  Administered 2024-02-28: 1.6 mL via INTRATHECAL

## 2024-02-28 MED ORDER — TRANEXAMIC ACID-NACL 1000-0.7 MG/100ML-% IV SOLN
1000.0000 mg | INTRAVENOUS | Status: AC
  Administered 2024-02-28: 1000 mg via INTRAVENOUS
  Filled 2024-02-28: qty 100

## 2024-02-28 MED ORDER — BUPIVACAINE LIPOSOME 1.3 % IJ SUSP: 20.0000 mL | Freq: Once | INTRAMUSCULAR | Status: DC

## 2024-02-28 MED ORDER — ASPIRIN 81 MG PO CHEW
81.0000 mg | CHEWABLE_TABLET | Freq: Two times a day (BID) | ORAL | Status: DC
Start: 2024-02-29 — End: 2024-02-29
  Administered 2024-02-29: 81 mg via ORAL
  Filled 2024-02-28: qty 1

## 2024-02-28 MED ORDER — DEXAMETHASONE SOD PHOSPHATE PF 10 MG/ML IJ SOLN
8.0000 mg | Freq: Once | INTRAMUSCULAR | Status: AC
  Administered 2024-02-28: 8 mg via INTRAVENOUS

## 2024-02-28 MED ORDER — POLYETHYLENE GLYCOL 3350 17 G PO PACK: 17.0000 g | PACK | Freq: Every day | ORAL | Status: DC | PRN

## 2024-02-28 MED ORDER — ALBUTEROL SULFATE (2.5 MG/3ML) 0.083% IN NEBU: 3.0000 mL | INHALATION_SOLUTION | Freq: Four times a day (QID) | RESPIRATORY_TRACT | Status: DC | PRN

## 2024-02-28 MED ORDER — LACTATED RINGERS IV SOLN: INTRAVENOUS | Status: DC

## 2024-02-28 MED ORDER — PHENYLEPHRINE HCL-NACL 20-0.9 MG/250ML-% IV SOLN
INTRAVENOUS | Status: DC | PRN
  Administered 2024-02-28: 50 ug/min via INTRAVENOUS

## 2024-02-28 MED ORDER — TRAMADOL HCL 50 MG PO TABS
50.0000 mg | ORAL_TABLET | Freq: Four times a day (QID) | ORAL | Status: DC | PRN
  Administered 2024-02-28: 50 mg via ORAL
  Filled 2024-02-28: qty 1

## 2024-02-28 MED ORDER — CEFAZOLIN SODIUM-DEXTROSE 2-4 GM/100ML-% IV SOLN
2.0000 g | INTRAVENOUS | Status: AC
  Administered 2024-02-28: 2 g via INTRAVENOUS
  Filled 2024-02-28: qty 100

## 2024-02-28 MED ORDER — MIDAZOLAM HCL (PF) 2 MG/2ML IJ SOLN
1.0000 mg | INTRAMUSCULAR | Status: DC
  Administered 2024-02-28: 2 mg via INTRAVENOUS
  Filled 2024-02-28: qty 2

## 2024-02-28 MED ORDER — LOSARTAN POTASSIUM 50 MG PO TABS
100.0000 mg | ORAL_TABLET | Freq: Every day | ORAL | Status: DC
  Administered 2024-02-29: 100 mg via ORAL
  Filled 2024-02-28: qty 2

## 2024-02-28 MED ORDER — DROPERIDOL 2.5 MG/ML IJ SOLN
0.6250 mg | Freq: Once | INTRAMUSCULAR | Status: DC | PRN
Start: 2024-02-28 — End: 2024-02-28

## 2024-02-28 MED ORDER — ATORVASTATIN CALCIUM 40 MG PO TABS
80.0000 mg | ORAL_TABLET | Freq: Every day | ORAL | Status: DC
  Administered 2024-02-29: 80 mg via ORAL
  Filled 2024-02-28: qty 2

## 2024-02-28 MED ORDER — GLYCOPYRROLATE 0.2 MG/ML IJ SOLN
INTRAMUSCULAR | Status: AC
  Filled 2024-02-28: qty 1

## 2024-02-28 MED ORDER — HYDROMORPHONE HCL 1 MG/ML IJ SOLN: 0.5000 mg | INTRAMUSCULAR | Status: DC | PRN

## 2024-02-28 MED ORDER — METOPROLOL SUCCINATE ER 25 MG PO TB24
25.0000 mg | ORAL_TABLET | Freq: Every day | ORAL | Status: DC
  Administered 2024-02-29: 25 mg via ORAL
  Filled 2024-02-28: qty 1

## 2024-02-28 MED ORDER — POVIDONE-IODINE 10 % EX SWAB: 2.0000 | Freq: Once | CUTANEOUS | Status: DC

## 2024-02-28 MED ORDER — OXYCODONE HCL 5 MG PO TABS
5.0000 mg | ORAL_TABLET | ORAL | Status: DC | PRN
  Administered 2024-02-28 – 2024-02-29 (×3): 10 mg via ORAL
  Filled 2024-02-28 (×3): qty 2

## 2024-02-28 MED ORDER — ONDANSETRON HCL 4 MG/2ML IJ SOLN
INTRAMUSCULAR | Status: DC | PRN
  Administered 2024-02-28: 4 mg via INTRAVENOUS

## 2024-02-28 MED ORDER — ONDANSETRON HCL 4 MG/2ML IJ SOLN
INTRAMUSCULAR | Status: AC
  Filled 2024-02-28: qty 2

## 2024-02-28 MED ORDER — PROPOFOL 1000 MG/100ML IV EMUL
INTRAVENOUS | Status: AC
  Filled 2024-02-28: qty 100

## 2024-02-28 MED ORDER — 0.9 % SODIUM CHLORIDE (POUR BTL) OPTIME
TOPICAL | Status: DC | PRN
  Administered 2024-02-28: 1000 mL

## 2024-02-28 MED ORDER — BUPIVACAINE LIPOSOME 1.3 % IJ SUSP
INTRAMUSCULAR | Status: AC
  Filled 2024-02-28: qty 20

## 2024-02-28 MED ORDER — SODIUM CHLORIDE (PF) 0.9 % IJ SOLN
INTRAMUSCULAR | Status: AC
Start: 2024-02-28 — End: 2024-02-28
  Filled 2024-02-28: qty 10

## 2024-02-28 MED ORDER — FENTANYL CITRATE (PF) 50 MCG/ML IJ SOSY: 25.0000 ug | PREFILLED_SYRINGE | INTRAMUSCULAR | Status: DC | PRN

## 2024-02-28 MED ORDER — ORAL CARE MOUTH RINSE: 15.0000 mL | Freq: Once | OROMUCOSAL | Status: AC

## 2024-02-28 MED ORDER — METHOCARBAMOL 500 MG PO TABS
500.0000 mg | ORAL_TABLET | Freq: Four times a day (QID) | ORAL | Status: DC | PRN
  Administered 2024-02-28 – 2024-02-29 (×3): 500 mg via ORAL
  Filled 2024-02-28 (×3): qty 1

## 2024-02-28 MED ORDER — SODIUM CHLORIDE 0.9 % IR SOLN
Status: DC | PRN
  Administered 2024-02-28: 1000 mL

## 2024-02-28 MED ORDER — DOCUSATE SODIUM 100 MG PO CAPS
100.0000 mg | ORAL_CAPSULE | Freq: Two times a day (BID) | ORAL | Status: DC
  Administered 2024-02-28 – 2024-02-29 (×2): 100 mg via ORAL
  Filled 2024-02-28 (×2): qty 1

## 2024-02-28 MED ORDER — BISACODYL 10 MG RE SUPP: 10.0000 mg | Freq: Every day | RECTAL | Status: DC | PRN

## 2024-02-28 MED ORDER — INSULIN ASPART 100 UNIT/ML IJ SOLN
0.0000 [IU] | Freq: Three times a day (TID) | INTRAMUSCULAR | Status: DC
  Administered 2024-02-28: 8 [IU] via SUBCUTANEOUS
  Administered 2024-02-29: 2 [IU] via SUBCUTANEOUS
  Filled 2024-02-28: qty 2

## 2024-02-28 MED ORDER — SODIUM CHLORIDE (PF) 0.9 % IJ SOLN
INTRAMUSCULAR | Status: AC
  Filled 2024-02-28: qty 50

## 2024-02-28 MED ORDER — OXYCODONE HCL 5 MG PO TABS: 5.0000 mg | ORAL_TABLET | Freq: Once | ORAL | Status: DC | PRN

## 2024-02-28 MED ORDER — DIPHENHYDRAMINE HCL 12.5 MG/5ML PO ELIX
12.5000 mg | ORAL_SOLUTION | ORAL | Status: DC | PRN
  Administered 2024-02-28: 25 mg via ORAL
  Filled 2024-02-28: qty 10

## 2024-02-28 MED ORDER — AMLODIPINE BESYLATE 10 MG PO TABS
10.0000 mg | ORAL_TABLET | Freq: Every day | ORAL | Status: DC
  Administered 2024-02-29: 10 mg via ORAL
  Filled 2024-02-28: qty 1

## 2024-02-28 MED ORDER — SODIUM CHLORIDE 0.9 % IV SOLN
INTRAVENOUS | Status: DC | PRN
  Administered 2024-02-28: 80 mL

## 2024-02-28 MED ORDER — OXYCODONE HCL 5 MG/5ML PO SOLN: 5.0000 mg | Freq: Once | ORAL | Status: DC | PRN

## 2024-02-28 MED ORDER — BUPIVACAINE-EPINEPHRINE (PF) 0.5% -1:200000 IJ SOLN
INTRAMUSCULAR | Status: DC | PRN
  Administered 2024-02-28: 15 mL via PERINEURAL

## 2024-02-28 SURGICAL SUPPLY — 41 items
ATTUNE MED DOME PAT 41 KNEE (Knees) IMPLANT
ATTUNE PS FEM RT SZ 8 CEM KNEE (Femur) IMPLANT
ATTUNE PSRP INSR SZ8 7 KNEE (Insert) IMPLANT
BAG COUNTER SPONGE SURGICOUNT (BAG) IMPLANT
BAG ZIPLOCK 12X15 (MISCELLANEOUS) ×2 IMPLANT
BASE TIBIAL ROT PLAT SZ 8 KNEE (Knees) IMPLANT
BLADE SAG 18X100X1.27 (BLADE) ×2 IMPLANT
BLADE SAW SGTL 11.0X1.19X90.0M (BLADE) ×2 IMPLANT
BNDG ELASTIC 6INX 5YD STR LF (GAUZE/BANDAGES/DRESSINGS) ×2 IMPLANT
BOWL SMART MIX CTS (DISPOSABLE) ×2 IMPLANT
CEMENT HV SMART SET (Cement) ×4 IMPLANT
COVER SURGICAL LIGHT HANDLE (MISCELLANEOUS) ×2 IMPLANT
CUFF TRNQT CYL 34X4.125X (TOURNIQUET CUFF) ×2 IMPLANT
DERMABOND ADVANCED .7 DNX12 (GAUZE/BANDAGES/DRESSINGS) ×2 IMPLANT
DRAPE U-SHAPE 47X51 STRL (DRAPES) ×2 IMPLANT
DRSG AQUACEL AG ADV 3.5X10 (GAUZE/BANDAGES/DRESSINGS) ×2 IMPLANT
DURAPREP 26ML APPLICATOR (WOUND CARE) ×2 IMPLANT
ELECT REM PT RETURN 15FT ADLT (MISCELLANEOUS) ×2 IMPLANT
GLOVE BIO SURGEON STRL SZ 6.5 (GLOVE) IMPLANT
GLOVE BIO SURGEON STRL SZ8 (GLOVE) ×2 IMPLANT
GLOVE BIOGEL PI IND STRL 7.0 (GLOVE) ×2 IMPLANT
GLOVE BIOGEL PI IND STRL 8 (GLOVE) ×2 IMPLANT
GOWN STRL REUS W/ TWL LRG LVL3 (GOWN DISPOSABLE) ×2 IMPLANT
HOLDER FOLEY CATH W/STRAP (MISCELLANEOUS) ×2 IMPLANT
IMMOBILIZER KNEE 20 THIGH 36 (SOFTGOODS) ×2 IMPLANT
KIT TURNOVER KIT A (KITS) ×2 IMPLANT
MANIFOLD NEPTUNE II (INSTRUMENTS) ×2 IMPLANT
NS IRRIG 1000ML POUR BTL (IV SOLUTION) ×2 IMPLANT
PACK TOTAL KNEE CUSTOM (KITS) ×2 IMPLANT
PADDING CAST COTTON 6X4 STRL (CAST SUPPLIES) ×4 IMPLANT
PENCIL SMOKE EVACUATOR (MISCELLANEOUS) ×2 IMPLANT
PIN STEINMAN FIXATION KNEE (PIN) IMPLANT
PROTECTOR NERVE ULNAR (MISCELLANEOUS) ×2 IMPLANT
SET HNDPC FAN SPRY TIP SCT (DISPOSABLE) ×2 IMPLANT
SUT MNCRL AB 4-0 PS2 18 (SUTURE) ×2 IMPLANT
SUT VIC AB 2-0 CT1 TAPERPNT 27 (SUTURE) ×6 IMPLANT
SUTURE STRATFX 0 PDS 27 VIOLET (SUTURE) ×2 IMPLANT
TOWEL GREEN STERILE FF (TOWEL DISPOSABLE) ×2 IMPLANT
TRAY FOLEY MTR SLVR 16FR STAT (SET/KITS/TRAYS/PACK) ×2 IMPLANT
TUBE SUCTION HIGH CAP CLEAR NV (SUCTIONS) ×2 IMPLANT
WRAP KNEE MAXI GEL POST OP (GAUZE/BANDAGES/DRESSINGS) ×2 IMPLANT

## 2024-02-28 NOTE — Discharge Instructions (Addendum)
 Ollen Gross, MD Total Joint Specialist [facility] [address], [address] [place], [place] [address] [phone]  TOTAL KNEE REPLACEMENT POSTOPERATIVE DIRECTIONS    Knee Rehabilitation, Guidelines Following Surgery  Results after knee surgery are often greatly improved when you follow the exercise, range of motion and muscle strengthening exercises prescribed by your doctor. Safety measures are also important to protect the knee from further injury. If any of these exercises cause you to have increased pain or swelling in your knee joint, decrease the amount until you are comfortable again and slowly increase them. If you have problems or questions, call your caregiver or physical therapist for advice.   BLOOD CLOT PREVENTION Take 81 mg Aspirin two times a day for three weeks following surgery. Then take an 81 mg Aspirin once a day for three weeks. Then discontinue Aspirin. You may resume your vitamins/supplements upon discharge from the hospital. Do not take any NSAIDs (Advil, Aleve, Ibuprofen, Meloxicam, etc.) for 3 weeks, while taking 81mg  Aspirin twice a day.   HOME CARE INSTRUCTIONS  Remove items at home which could result in a fall. This includes throw rugs or furniture in walking pathways.  ICE to the affected knee as much as tolerated. Icing helps control swelling. If the swelling is well controlled you will be more comfortable and rehab easier. Continue to use ice on the knee for pain and swelling from surgery. You may notice swelling that will progress down to the foot and ankle. This is normal after surgery. Elevate the leg when you are not up walking on it.    Continue to use the breathing machine which will help keep your temperature down. It is common for your temperature to cycle up and down following surgery, especially at night when you are not up moving around and exerting yourself. The breathing machine keeps your lungs expanded and your temperature  down. Do not place pillow under the operative knee, focus on keeping the knee straight while resting  DIET You may resume your previous home diet once you are discharged from the hospital.  DRESSING / WOUND CARE / SHOWERING Keep your bulky bandage on for 2 days. On the third post-operative day you may remove the Ace bandage and gauze. There is a waterproof adhesive bandage on your skin which will stay in place until your first follow-up appointment. Once you remove this you will not need to place another bandage You may begin showering 3 days following surgery, but do not submerge the incision under water.  ACTIVITY For the first 5 days, the key is rest and control of pain and swelling Do your home exercises twice a day starting on post-operative day 3. On the days you go to physical therapy, just do the home exercises once that day. You should rest, ice and elevate the leg for 50 minutes out of every hour. Get up and walk/stretch for 10 minutes per hour. After 5 days you can increase your activity slowly as tolerated. Walk with your walker as instructed. Use the walker until you are comfortable transitioning to a cane. Walk with the cane in the opposite hand of the operative leg. You may discontinue the cane once you are comfortable and walking steadily. Avoid periods of inactivity such as sitting longer than an hour when not asleep. This helps prevent blood clots.  You may discontinue the knee immobilizer once you are able to perform a straight leg raise while lying down. You may resume a sexual relationship in  one month or when given the OK by your doctor.  You may return to work once you are cleared by your doctor.  Do not drive a car for 6 weeks or until released by your surgeon.  Do not drive while taking narcotics.  TED HOSE STOCKINGS Wear the elastic stockings on both legs for three weeks following surgery during the day. You may remove them at night for sleeping.  WEIGHT  BEARING Weight bearing as tolerated with assist device (walker, cane, etc) as directed, use it as long as suggested by your surgeon or therapist, typically at least 4-6 weeks.  POSTOPERATIVE CONSTIPATION PROTOCOL Constipation - defined medically as fewer than three stools per week and severe constipation as less than one stool per week.  One of the most common issues patients have following surgery is constipation.  Even if you have a regular bowel pattern at home, your normal regimen is likely to be disrupted due to multiple reasons following surgery.  Combination of anesthesia, postoperative narcotics, change in appetite and fluid intake all can affect your bowels.  In order to avoid complications following surgery, here are some recommendations in order to help you during your recovery period.  Colace (docusate) - Pick up an over-the-counter form of Colace or another stool softener and take twice a day as long as you are requiring postoperative pain medications.  Take with a full glass of water daily.  If you experience loose stools or diarrhea, hold the colace until you stool forms back up. If your symptoms do not get better within 1 week or if they get worse, check with your doctor. Dulcolax (bisacodyl) - Pick up over-the-counter and take as directed by the product packaging as needed to assist with the movement of your bowels.  Take with a full glass of water.  Use this product as needed if not relieved by Colace only.  MiraLax (polyethylene glycol) - Pick up over-the-counter to have on hand. MiraLax is a solution that will increase the amount of water in your bowels to assist with bowel movements.  Take as directed and can mix with a glass of water, juice, soda, coffee, or tea. Take if you go more than two days without a movement. Do not use MiraLax more than once per day. Call your doctor if you are still constipated or irregular after using this medication for 7 days in a row.  If you continue  to have problems with postoperative constipation, please contact the office for further assistance and recommendations.  If you experience "the worst abdominal pain ever" or develop nausea or vomiting, please contact the office immediatly for further recommendations for treatment.  ITCHING If you experience itching with your medications, try taking only a single pain pill, or even half a pain pill at a time.  You can also use Benadryl over the counter for itching or also to help with sleep.   MEDICATIONS See your medication summary on the "After Visit Summary" that the nursing staff will review with you prior to discharge.  You may have some home medications which will be placed on hold until you complete the course of blood thinner medication.  It is important for you to complete the blood thinner medication as prescribed by your surgeon.  Continue your approved medications as instructed at time of discharge.  PRECAUTIONS If you experience chest pain or shortness of breath - call 911 immediately for transfer to the hospital emergency department.  If you develop a fever greater that  101 F, purulent drainage from wound, increased redness or drainage from wound, foul odor from the wound/dressing, or calf pain - CONTACT YOUR SURGEON.                                                   FOLLOW-UP APPOINTMENTS Make sure you keep all of your appointments after your operation with your surgeon and caregivers. You should call the office at the above phone number and make an appointment for approximately two weeks after the date of your surgery or on the date instructed by your surgeon outlined in the "After Visit Summary".  RANGE OF MOTION AND STRENGTHENING EXERCISES  Rehabilitation of the knee is important following a knee injury or an operation. After just a few days of immobilization, the muscles of the thigh which control the knee become weakened and shrink (atrophy). Knee exercises are designed to build up  the tone and strength of the thigh muscles and to improve knee motion. Often times heat used for twenty to thirty minutes before working out will loosen up your tissues and help with improving the range of motion but do not use heat for the first two weeks following surgery. These exercises can be done on a training (exercise) mat, on the floor, on a table or on a bed. Use what ever works the best and is most comfortable for you Knee exercises include:  Leg Lifts - While your knee is still immobilized in a splint or cast, you can do straight leg raises. Lift the leg to 60 degrees, hold for 3 sec, and slowly lower the leg. Repeat 10-20 times 2-3 times daily. Perform this exercise against resistance later as your knee gets better.  Quad and Hamstring Sets - Tighten up the muscle on the front of the thigh (Quad) and hold for 5-10 sec. Repeat this 10-20 times hourly. Hamstring sets are done by pushing the foot backward against an object and holding for 5-10 sec. Repeat as with quad sets.  Leg Slides: Lying on your back, slowly slide your foot toward your buttocks, bending your knee up off the floor (only go as far as is comfortable). Then slowly slide your foot back down until your leg is flat on the floor again. Angel Wings: Lying on your back spread your legs to the side as far apart as you can without causing discomfort.  A rehabilitation program following serious knee injuries can speed recovery and prevent re-injury in the future due to weakened muscles. Contact your doctor or a physical therapist for more information on knee rehabilitation.   POST-OPERATIVE OPIOID TAPER INSTRUCTIONS: It is important to wean off of your opioid medication as soon as possible. If you do not need pain medication after your surgery it is ok to stop day one. Opioids include: Codeine, Hydrocodone(Norco, Vicodin), Oxycodone(Percocet, oxycontin) and hydromorphone amongst others.  Long term and even short term use of opiods can  cause: Increased pain response Dependence Constipation Depression Respiratory depression And more.  Withdrawal symptoms can include Flu like symptoms Nausea, vomiting And more Techniques to manage these symptoms Hydrate well Eat regular healthy meals Stay active Use relaxation techniques(deep breathing, meditating, yoga) Do Not substitute Alcohol to help with tapering If you have been on opioids for less than two weeks and do not have pain than it is ok to stop all together.  Plan to wean off of opioids This plan should start within one week post op of your joint replacement. Maintain the same interval or time between taking each dose and first decrease the dose.  Cut the total daily intake of opioids by one tablet each day Next start to increase the time between doses. The last dose that should be eliminated is the evening dose.   IF YOU ARE TRANSFERRED TO A SKILLED REHAB FACILITY If the patient is transferred to a skilled rehab facility following release from the hospital, a list of the current medications will be sent to the facility for the patient to continue.  When discharged from the skilled rehab facility, please have the facility set up the patient's Home Health Physical Therapy prior to being released. Also, the skilled facility will be responsible for providing the patient with their medications at time of release from the facility to include their pain medication, the muscle relaxants, and their blood thinner medication. If the patient is still at the rehab facility at time of the two week follow up appointment, the skilled rehab facility will also need to assist the patient in arranging follow up appointment in our office and any transportation needs.  MAKE SURE YOU:  Understand these instructions.  Get help right away if you are not doing well or get worse.   DENTAL ANTIBIOTICS:  In most cases prophylactic antibiotics for Dental procdeures after total joint surgery are  not necessary.  Exceptions are as follows:  1. History of prior total joint infection  2. Severely immunocompromised (Organ Transplant, cancer chemotherapy, Rheumatoid biologic meds such as Humera)  3. Poorly controlled diabetes (A1C &gt; 8.0, blood glucose over 200)  If you have one of these conditions, contact your surgeon for an antibiotic prescription, prior to your dental procedure.    Pick up stool softner and laxative for home use following surgery while on pain medications. Do not submerge incision under water. Please use good hand washing techniques while changing dressing each day. May shower starting three days after surgery. Please use a clean towel to pat the incision dry following showers. Continue to use ice for pain and swelling after surgery. Do not use any lotions or creams on the incision until instructed by your surgeon.

## 2024-02-28 NOTE — Care Plan (Signed)
 Ortho Bundle Case Management Note  Patient Details  Name: Vincent Short MRN: [id number] Date of Birth: 06-05-1950  RT TKA on 02/28/24  DCP: Home with wife  DME: No needs; has RW and cane  PT: DOAR                   DME Arranged:  N/A DME Agency:  NA  HH Arranged:    HH Agency:     Additional Comments: Please contact me with any questions of if this plan should need to change.  Burnard Dross, Case Manager [facility] [phone]  Ext. [phone]   02/28/2024, 8:22 AM

## 2024-02-28 NOTE — Anesthesia Postprocedure Evaluation (Signed)
 Anesthesia Post Note  Patient: Vincent Short  Procedure(s) Performed: ARTHROPLASTY, KNEE, TOTAL (Right: Knee)     Patient location during evaluation: PACU Anesthesia Type: Spinal Level of consciousness: awake and alert Pain management: pain level controlled Vital Signs Assessment: post-procedure vital signs reviewed and stable Respiratory status: spontaneous breathing, nonlabored ventilation and respiratory function stable Cardiovascular status: blood pressure returned to baseline Postop Assessment: no apparent nausea or vomiting and spinal receding Anesthetic complications: no   No notable events documented.  Last Vitals:  Vitals:   02/28/24 1130 02/28/24 1144  BP: 97/65 100/68  Pulse: 73 66  Resp: 20   Temp: 36.4 C 36.6 C  SpO2: 96% 92%    Last Pain:  Vitals:   02/28/24 1152  TempSrc:   PainSc: 1                  Vertell Row

## 2024-02-28 NOTE — Evaluation (Signed)
 Physical Therapy Evaluation Patient Details Name: Vincent Short MRN: [id number] DOB: 04-28-1950 Today's Date: 02/28/2024  History of Present Illness  73 yo male s/p TK A on 02/28/24. PMH: DM, IBS, COPD, L ankle fx and repair, RCR  Clinical Impression  Pt admitted with above diagnosis.  Pt  amb 41' with RW and CGA, tol without incr pain. Ice to knee EOS.  Anticipate steady progress in acute setting.    Pt currently with functional limitations due to the deficits listed below (see PT Problem List). Pt will benefit from acute skilled PT to increase their independence and safety with mobility to allow discharge.           If plan is discharge home, recommend the following: A little help with walking and/or transfers;A little help with bathing/dressing/bathroom;Help with stairs or ramp for entrance;Assistance with cooking/housework   Can travel by private vehicle        Equipment Recommendations None recommended by PT  Recommendations for Other Services       Functional Status Assessment Patient has had a recent decline in their functional status and demonstrates the ability to make significant improvements in function in a reasonable and predictable amount of time.     Precautions / Restrictions Precautions Precautions: Fall;Knee Restrictions Weight Bearing Restrictions Per Provider Order: No      Mobility  Bed Mobility Overal bed mobility: Needs Assistance Bed Mobility: Supine to Sit     Supine to sit: Supervision     General bed mobility comments: for lines and safety    Transfers Overall transfer level: Needs assistance Equipment used: Rolling walker (2 wheels) Transfers: Sit to/from Stand Sit to Stand: Contact guard assist           General transfer comment: cues for hand placement and RLE Position    Ambulation/Gait Ambulation/Gait assistance: Contact guard assist Gait Distance (Feet): 70 Feet Assistive device: Rolling walker (2 wheels) Gait  Pattern/deviations: Step-to pattern       General Gait Details: cues for sequence and RW position  Stairs            Wheelchair Mobility     Tilt Bed    Modified Rankin (Stroke Patients Only)       Balance                                             Pertinent Vitals/Pain Pain Assessment Pain Assessment: 0-10 Pain Score: 2  Pain Descriptors / Indicators: Grimacing, Discomfort, Sore Pain Intervention(s): Limited activity within patient's tolerance, Monitored during session, Premedicated before session    Home Living Family/patient expects to be discharged to:: Private residence Living Arrangements: Spouse/significant other;Parent Available Help at Discharge: Family Type of Home: House Home Access: Stairs to enter   Secretary/administrator of Steps: 1   Home Layout: Able to live on main level with bedroom/bathroom;Multi-level Home Equipment: Agricultural Consultant (2 wheels);Cane - single point;Shower seat      Prior Function Prior Level of Function : Independent/Modified Independent                     Extremity/Trunk Assessment   Upper Extremity Assessment Upper Extremity Assessment: Overall WFL for tasks assessed    Lower Extremity Assessment Lower Extremity Assessment: RLE deficits/detail RLE Deficits / Details: ankle WFL, quads 3/5, hip flexors 3+/5, anticipated post op deficits  Communication   Communication Communication: No apparent difficulties    Cognition Arousal: Alert Behavior During Therapy: WFL for tasks assessed/performed   PT - Cognitive impairments: No apparent impairments                         Following commands: Intact       Cueing Cueing Techniques: Verbal cues     General Comments      Exercises Total Joint Exercises Ankle Circles/Pumps: AROM, 10 reps Quad Sets: AROM, 5 reps, Both   Assessment/Plan    PT Assessment Patient needs continued PT services  PT Problem List  Decreased strength;Decreased mobility;Decreased range of motion;Decreased activity tolerance;Decreased balance;Decreased knowledge of use of DME;Decreased knowledge of precautions       PT Treatment Interventions DME instruction;Therapeutic exercise;Gait training;Functional mobility training;Therapeutic activities;Patient/family education;Stair training    PT Goals (Current goals can be found in the Care Plan section)  Acute Rehab PT Goals PT Goal Formulation: With patient Time For Goal Achievement: 03/13/24 Potential to Achieve Goals: Good    Frequency 7X/week     Co-evaluation               AM-PAC PT 6 Clicks Mobility  Outcome Measure Help needed turning from your back to your side while in a flat bed without using bedrails?: A Little Help needed moving from lying on your back to sitting on the side of a flat bed without using bedrails?: A Little Help needed moving to and from a bed to a chair (including a wheelchair)?: A Little Help needed standing up from a chair using your arms (e.g., wheelchair or bedside chair)?: A Little Help needed to walk in hospital room?: A Little Help needed climbing 3-5 steps with a railing? : A Little 6 Click Score: 18    End of Session   Activity Tolerance: Patient tolerated treatment well Patient left: with call bell/phone within reach;in chair;with family/visitor present;with chair alarm set Nurse Communication: Mobility status PT Visit Diagnosis: Other abnormalities of gait and mobility (R26.89)    Time: 8449-8381 PT Time Calculation (min) (ACUTE ONLY): 28 min   Charges:   PT Evaluation $PT Eval Low Complexity: 1 Low PT Treatments $Gait Training: 8-22 mins PT General Charges $$ ACUTE PT VISIT: 1 Visit         Angelea Penny, PT  Acute Rehab Dept [facility]) [phone]  02/28/2024   [facility] 02/28/2024, 4:37 PM

## 2024-02-28 NOTE — Progress Notes (Signed)
   02/28/24 2300  BiPAP/CPAP/SIPAP  BiPAP/CPAP/SIPAP Pt Type Adult  Reason BIPAP/CPAP not in use Non-compliant (pt said he don't wish to wear his CPAP tonight)

## 2024-02-28 NOTE — Interval H&P Note (Signed)
 History and Physical Interval Note:  02/28/2024 7:11 AM  Vincent Short  has presented today for surgery, with the diagnosis of Right knee osteoarthritis.  The various methods of treatment have been discussed with the patient and family. After consideration of risks, benefits and other options for treatment, the patient has consented to  Procedure(s): ARTHROPLASTY, KNEE, TOTAL (Right) as a surgical intervention.  The patient's history has been reviewed, patient examined, no change in status, stable for surgery.  I have reviewed the patient's chart and labs.  Questions were answered to the patient's satisfaction.     Dempsey [name]

## 2024-02-28 NOTE — Transfer of Care (Signed)
 Immediate Anesthesia Transfer of Care Note  Patient: Vincent Short  Procedure(s) Performed: ARTHROPLASTY, KNEE, TOTAL (Right: Knee)  Patient Location: PACU  Anesthesia Type:MAC and Spinal  Level of Consciousness: awake  Airway & Oxygen Therapy: Patient Spontanous Breathing and Patient connected to face mask oxygen  Post-op Assessment: Report given to RN and Post -op Vital signs reviewed and stable  Post vital signs: Reviewed and stable  Last Vitals:  Vitals Value Taken Time  BP 91/54 02/28/24 10:45  Temp    Pulse 69 02/28/24 10:46  Resp 16 02/28/24 10:46  SpO2 99 % 02/28/24 10:46  Vitals shown include unfiled device data.  Last Pain:  Vitals:   02/28/24 0910  TempSrc:   PainSc: 0-No pain         Complications: No notable events documented.

## 2024-02-28 NOTE — Anesthesia Procedure Notes (Signed)
 Spinal  Patient location during procedure: OR Start time: 02/28/2024 9:14 AM End time: 02/28/2024 9:17 AM Reason for block: surgical anesthesia Staffing Performed: anesthesiologist  Anesthesiologist: Paul Lamarr BRAVO, MD Performed by: Paul Lamarr BRAVO, MD Authorized by: Paul Lamarr BRAVO, MD   Preanesthetic Checklist Completed: patient identified, IV checked, risks and benefits discussed, surgical consent, monitors and equipment checked, pre-op evaluation and timeout performed Spinal Block Patient position: sitting Prep: DuraPrep and site prepped and draped Patient monitoring: continuous pulse ox, blood pressure and heart rate Approach: right paramedian Location: L3-4 Injection technique: single-shot Needle Needle type: Pencan  Needle gauge: 24 G Needle length: 9 cm Assessment Events: CSF return Additional Notes Risks, benefits, and alternative discussed. Patient gave consent to procedure. Prepped and draped in sitting position. Patient sedated but responsive to voice. Clear CSF obtained on second attempt (first midline, second right paramedian). Positive terminal aspiration. No pain or paraesthesias with injection. Patient tolerated procedure well. Vital signs stable. LANEY Paul, MD

## 2024-02-28 NOTE — Progress Notes (Signed)
 Orthopedic Tech Progress Note Patient Details:  Vincent Short June 25, 1950 [id number] Applied CPM per order. Will remove at 3:10 pm.  CPM Right Knee CPM Right Knee: On Right Knee Flexion (Degrees): 40 Right Knee Extension (Degrees): 10  Post Interventions Patient Tolerated: Well Instructions Provided: Adjustment of device, Care of device, Poper ambulation with device Ortho Devices Type of Ortho Device: CPM padding Ortho Device/Splint Location: RLE Ortho Device/Splint Interventions: Ordered, Application, Adjustment   Post Interventions Patient Tolerated: Well Instructions Provided: Adjustment of device, Care of device, Poper ambulation with device  [name] 02/28/2024, 11:13 AM

## 2024-02-28 NOTE — Progress Notes (Signed)
 Orthopedic Tech Progress Note Patient Details:  Vincent Short [birth year] [id number]  Patient ID: [name], adult   DOB: 08/18/1950, 73 y.o.   MRN: [id number] Removed CPM from pt.  [name] 02/28/2024, 3:47 PM

## 2024-02-28 NOTE — Op Note (Signed)
 OPERATIVE REPORT-TOTAL KNEE ARTHROPLASTY   Pre-operative diagnosis- Osteoarthritis  Right knee(s)  Post-operative diagnosis- Osteoarthritis Right knee(s)  Procedure-  Right  Total Knee Arthroplasty  Surgeon- Dempsey GAILS. Zakyra Kukuk, MD  Assistant- Corean Sender, PA-C   Anesthesia-  Adductor canal block and spinal  EBL-25 ml   Drains None  Tourniquet time-  Total Tourniquet Time Documented: Thigh (Right) - 36 minutes Total: Thigh (Right) - 36 minutes     Complications- None  Condition-PACU - hemodynamically stable.   Brief Clinical Note  Vincent Short is a 73 y.o. year old male with end stage OA of his right knee with progressively worsening pain and dysfunction. He has constant pain, with activity and at rest and significant functional deficits with difficulties even with ADLs. He has had extensive non-op management including analgesics, injections of cortisone and viscosupplements, and home exercise program, but remains in significant pain with significant dysfunction. Radiographs show bone on bone arthritis medial and patellofemoral. He presents now for right Total Knee Arthroplasty.     Procedure in detail---   The patient is brought into the operating room and positioned supine on the operating table. After successful administration of  Adductor canal block and spinal,   a tourniquet is placed high on the  Right thigh(s) and the lower extremity is prepped and draped in the usual sterile fashion. Time out is performed by the operating team and then the  Right lower extremity is wrapped in Esmarch, knee flexed and the tourniquet inflated to 300 mmHg.       A midline incision is made with a ten blade through the subcutaneous tissue to the level of the extensor mechanism. A fresh blade is used to make a medial parapatellar arthrotomy. Soft tissue over the proximal medial tibia is subperiosteally elevated to the joint line with a knife and into the semimembranosus bursa with a  Cobb elevator. Soft tissue over the proximal lateral tibia is elevated with attention being paid to avoiding the patellar tendon on the tibial tubercle. The patella is everted, knee flexed 90 degrees and the ACL and PCL are removed. Findings are bone on bone medial and patellofemoral with large global osteophytes        The drill is used to create a starting hole in the distal femur and the canal is thoroughly irrigated with sterile saline to remove the fatty contents. The 5 degree Right  valgus alignment guide is placed into the femoral canal and the distal femoral cutting block is pinned to remove 10 mm off the distal femur. Resection is made with an oscillating saw.      The tibia is subluxed forward and the menisci are removed. The extramedullary alignment guide is placed referencing proximally at the medial aspect of the tibial tubercle and distally along the second metatarsal axis and tibial crest. The block is pinned to remove 2mm off the more deficient medial  side. Resection is made with an oscillating saw. Size 8is the most appropriate size for the tibia and the proximal tibia is prepared with the modular drill and keel punch for that size.      The femoral sizing guide is placed and size 8 is most appropriate. Rotation is marked off the epicondylar axis and confirmed by creating a rectangular flexion gap at 90 degrees. The size 8 cutting block is pinned in this rotation and the anterior, posterior and chamfer cuts are made with the oscillating saw. The intercondylar block is then placed and that cut is made.  Trial size 8 tibial component, trial size 8 posterior stabilized femur and a 7  mm posterior stabilized rotating platform insert trial is placed. Full extension is achieved with excellent varus/valgus and anterior/posterior balance throughout full range of motion. The patella is everted and thickness measured to be 27  mm. Free hand resection is taken to 15 mm, a 41 template is placed, lug  holes are drilled, trial patella is placed, and it tracks normally. Osteophytes are removed off the posterior femur with the trial in place. All trials are removed and the cut bone surfaces prepared with pulsatile lavage. Cement is mixed and once ready for implantation, the size 8 tibial implant, size  8 posterior stabilized femoral component, and the size 41 patella are cemented in place and the patella is held with the clamp. The trial insert is placed and the knee held in full extension. The Exparel  (20 ml mixed with 60 ml saline) is injected into the extensor mechanism, posterior capsule, medial and lateral gutters and subcutaneous tissues.  All extruded cement is removed and once the cement is hard the permanent 7 mm posterior stabilized rotating platform insert is placed into the tibial tray.      The wound is copiously irrigated with saline solution and the extensor mechanism closed with # 0 Stratofix suture. The tourniquet is released for a total tourniquet time of 36  minutes. Flexion against gravity is 140 degrees and the patella tracks normally. Subcutaneous tissue is closed with 2.0 vicryl and subcuticular with running 4.0 Monocryl. The incision is cleaned and dried and steri-strips and a bulky sterile dressing are applied. The limb is placed into a knee immobilizer and the patient is awakened and transported to recovery in stable condition.      Please note that a surgical assistant was a medical necessity for this procedure in order to perform it in a safe and expeditious manner. Surgical assistant was necessary to retract the ligaments and vital neurovascular structures to prevent injury to them and also necessary for proper positioning of the limb to allow for anatomic placement of the prosthesis.   Dempsey ROCKFORD Chayah Mckee, MD    02/28/2024, 10:23 AM

## 2024-02-28 NOTE — Anesthesia Procedure Notes (Signed)
 Anesthesia Regional Block: Adductor canal block   Pre-Anesthetic Checklist: , timeout performed,  Correct Patient, Correct Site, Correct Laterality,  Correct Procedure, Correct Position, site marked,  Risks and benefits discussed,  Pre-op evaluation,  At surgeon's request and post-op pain management  Laterality: Right  Prep: Maximum Sterile Barrier Precautions used, chloraprep       Needles:  Injection technique: Single-shot  Needle Type: Echogenic Stimulator Needle     Needle Length: 9cm  Needle Gauge: 22     Additional Needles:   Procedures:,,,, ultrasound used (permanent image in chart),,    Narrative:  Start time: 02/28/2024 9:02 AM End time: 02/28/2024 9:05 AM Injection made incrementally with aspirations every 5 mL.  Performed by: Personally  Anesthesiologist: Paul Lamarr BRAVO, MD  Additional Notes: Risks, benefits, and alternative discussed. Patient gave consent for procedure. Patient prepped and draped in sterile fashion. Sedation administered, patient remains easily responsive to voice. Relevant anatomy identified with ultrasound guidance. Local anesthetic given in 5cc increments with no signs or symptoms of intravascular injection. No pain or paraesthesias with injection. Patient monitored throughout procedure with no signs of LAST or immediate complications. Tolerated well. Ultrasound image placed in chart.  LANEY Paul, MD

## 2024-02-29 ENCOUNTER — Encounter (HOSPITAL_COMMUNITY): Payer: Self-pay | Admitting: Orthopedic Surgery

## 2024-02-29 DIAGNOSIS — F1090 Alcohol use, unspecified, uncomplicated: Secondary | ICD-10-CM | POA: Diagnosis not present

## 2024-02-29 DIAGNOSIS — E119 Type 2 diabetes mellitus without complications: Secondary | ICD-10-CM | POA: Diagnosis not present

## 2024-02-29 DIAGNOSIS — I2510 Atherosclerotic heart disease of native coronary artery without angina pectoris: Secondary | ICD-10-CM | POA: Diagnosis not present

## 2024-02-29 DIAGNOSIS — Z79899 Other long term (current) drug therapy: Secondary | ICD-10-CM | POA: Diagnosis not present

## 2024-02-29 DIAGNOSIS — Z87891 Personal history of nicotine dependence: Secondary | ICD-10-CM | POA: Diagnosis not present

## 2024-02-29 DIAGNOSIS — Z7982 Long term (current) use of aspirin: Secondary | ICD-10-CM | POA: Diagnosis not present

## 2024-02-29 DIAGNOSIS — J449 Chronic obstructive pulmonary disease, unspecified: Secondary | ICD-10-CM | POA: Diagnosis not present

## 2024-02-29 DIAGNOSIS — I10 Essential (primary) hypertension: Secondary | ICD-10-CM | POA: Diagnosis not present

## 2024-02-29 DIAGNOSIS — M1711 Unilateral primary osteoarthritis, right knee: Secondary | ICD-10-CM | POA: Diagnosis not present

## 2024-02-29 LAB — CBC
HCT: 40.5 % (ref 39.0–52.0)
Hemoglobin: 13.6 g/dL (ref 13.0–17.0)
MCH: 31.7 pg (ref 26.0–34.0)
MCHC: 33.6 g/dL (ref 30.0–36.0)
MCV: 94.4 fL (ref 80.0–100.0)
Platelets: 246 K/uL (ref 150–400)
RBC: 4.29 MIL/uL (ref 4.22–5.81)
RDW: 12.7 % (ref 11.5–15.5)
WBC: 22.7 K/uL — ABNORMAL HIGH (ref 4.0–10.5)
nRBC: 0 % (ref 0.0–0.2)

## 2024-02-29 LAB — BASIC METABOLIC PANEL WITH GFR
Anion gap: 8 (ref 5–15)
BUN: 11 mg/dL (ref 8–23)
CO2: 23 mmol/L (ref 22–32)
Calcium: 8.7 mg/dL — ABNORMAL LOW (ref 8.9–10.3)
Chloride: 108 mmol/L (ref 98–111)
Creatinine, Ser: 0.65 mg/dL (ref 0.61–1.24)
GFR, Estimated: >60 mL/min (ref >60)
Glucose, Bld: 135 mg/dL — ABNORMAL HIGH (ref 70–99)
Potassium: 4.2 mmol/L (ref 3.5–5.1)
Sodium: 139 mmol/L (ref 135–145)

## 2024-02-29 LAB — GLUCOSE, CAPILLARY: Glucose-Capillary: 132 mg/dL — ABNORMAL HIGH (ref 70–99)

## 2024-02-29 MED ORDER — OXYCODONE HCL 5 MG PO TABS: 5.0000 mg | ORAL_TABLET | Freq: Four times a day (QID) | ORAL | 0 refills | Status: DC | PRN

## 2024-02-29 MED ORDER — METHOCARBAMOL 500 MG PO TABS: 500.0000 mg | ORAL_TABLET | Freq: Four times a day (QID) | ORAL | 0 refills | Status: DC | PRN

## 2024-02-29 MED ORDER — TRAMADOL HCL 50 MG PO TABS: 50.0000 mg | ORAL_TABLET | Freq: Four times a day (QID) | ORAL | 0 refills | Status: DC | PRN

## 2024-02-29 MED ORDER — ONDANSETRON HCL 4 MG PO TABS: 4.0000 mg | ORAL_TABLET | Freq: Four times a day (QID) | ORAL | 0 refills | Status: DC | PRN

## 2024-02-29 MED ORDER — ASPIRIN 81 MG PO CHEW: 81.0000 mg | CHEWABLE_TABLET | Freq: Two times a day (BID) | ORAL | 0 refills | Status: AC

## 2024-02-29 NOTE — TOC Transition Note (Signed)
 Transition of Care [facility]) - Discharge Note   Patient Details  Name: Vincent Short MRN: [id number] Date of Birth: Oct 18, 1950  [facility] CM/SW Contact:  NORMAN ASPEN, LCSW Phone Number: 02/29/2024, 9:54 AM   Clinical Narrative:     Met with pt who confirms he has all needed DME in the home.  OPPT already arranged with [facility].  No further IP CM needs.  Final next level of care: OP Rehab Barriers to Discharge: No Barriers Identified   Patient Goals and CMS Choice Patient states their goals for this hospitalization and ongoing recovery are:: return home          Discharge Placement                       Discharge Plan and Services Additional resources added to the After Visit Summary for                  DME Arranged: N/A DME Agency: NA                  Social Drivers of Health (SDOH) Interventions SDOH Screenings   Food Insecurity: No Food Insecurity (02/28/2024)  Housing: Low Risk  (02/28/2024)  Transportation Needs: No Transportation Needs (02/28/2024)  Utilities: Not At Risk (02/28/2024)  Alcohol Screen: Low Risk  (01/05/2024)  Depression (PHQ2-9): Low Risk  (02/17/2024)  Financial Resource Strain: Low Risk  (01/05/2024)  Physical Activity: Insufficiently Active (01/05/2024)  Social Connections: Socially Integrated (02/28/2024)  Stress: No Stress Concern Present (01/05/2024)  Tobacco Use: Medium Risk (02/28/2024)  Health Literacy: Adequate Health Literacy (01/05/2024)     Readmission Risk Interventions     No data to display

## 2024-02-29 NOTE — Progress Notes (Signed)
 Subjective: 1 Day Post-Op Procedure(s) (LRB): ARTHROPLASTY, KNEE, TOTAL (Right) Patient reports pain as mild.   Patient seen in rounds by Dr. Melodi. Patient is well, and has had no acute complaints or problems No issues overnight. Denies chest pain, SOB, or calf pain. Foley catheter to be removed this AM.  We will continue therapy today, ambulated 38' yesterday.   Objective: Vital signs in last 24 hours: Temp:  [97.5 F (36.4 C)-98.2 F (36.8 C)] 97.7 F (36.5 C) (11/04 9391) Pulse Rate:  [65-93] 93 (11/04 0608) Resp:  [0-20] 17 (11/04 0608) BP: (91-130)/(54-90) 125/75 (11/04 0608) SpO2:  [92 %-99 %] 94 % (11/04 0608)  Intake/Output from previous day:  Intake/Output Summary (Last 24 hours) at 02/29/2024 0836 Last data filed at 02/29/2024 0600 Gross per 24 hour  Intake 3156.22 ml  Output 3725 ml  Net -568.78 ml     Intake/Output this shift: No intake/output data recorded.  Labs: Recent Labs    02/29/24 0333  HGB 13.6   Recent Labs    02/29/24 0333  WBC 22.7*  RBC 4.29  HCT 40.5  PLT 246   Recent Labs    02/29/24 0333  NA 139  K 4.2  CL 108  CO2 23  BUN 11  CREATININE 0.65  GLUCOSE 135*  CALCIUM  8.7*   No results for input(s): LABPT, INR in the last 72 hours.  Exam: General - Patient is Alert and Oriented Extremity - Neurologically intact Neurovascular intact Sensation intact distally Dorsiflexion/Plantar flexion intact Dressing - dressing C/D/I Motor Function - intact, moving foot and toes well on exam.   Past Medical History:  Diagnosis Date   Allergic rhinitis    Arthritis    Asthma    Atelectasis    Rt upper lobe   COPD (chronic obstructive pulmonary disease) (HCC)    GERD (gastroesophageal reflux disease)    History of colon polyps 03/14/2009   Tubular adenomatous and Hyperplastic polyps   HTN (hypertension)    Hyperlipidemia    Lung mass    noted on CT scan(12/24/10)    S/P cardiac cath    negative   Tobacco abuse     Treadmill stress test negative for angina pectoris    Dr. Burnard    Assessment/Plan: 1 Day Post-Op Procedure(s) (LRB): ARTHROPLASTY, KNEE, TOTAL (Right) Principal Problem:   OA (osteoarthritis) of knee Active Problems:   Primary osteoarthritis of right knee  Estimated body mass index is 26.91 kg/m as calculated from the following:   Height as of this encounter: 6' (1.829 m).   Weight as of this encounter: 90 kg. Advance diet Up with therapy D/C IV fluids   Patient's anticipated LOS is less than 2 midnights, meeting these requirements: - Lives within 1 hour of care - Has a competent adult at home to recover with post-op recover - NO history of  - Chronic pain requiring opioids  - Coronary Artery Disease  - Heart failure  - Heart attack  - Stroke  - DVT/VTE  - Cardiac arrhythmia  - Respiratory Failure/COPD  - Renal failure  - Anemia  - Advanced Liver disease  DVT Prophylaxis - Aspirin Weight bearing as tolerated. Continue therapy.  Plan is to go Home after hospital stay. Plan for discharge later today if progresses with therapy and meeting goals. Scheduled for OPPT at [facility]. Follow-up in the office in 2 weeks.  The PDMP database was reviewed today prior to any opioid medications being prescribed to this patient.  Roxie Mess,  PA-C Orthopedic Surgery [phone] 02/29/2024, 8:36 AM

## 2024-02-29 NOTE — Progress Notes (Signed)
 Physical Therapy Treatment Patient Details Name: Vincent Short MRN: [id number] DOB: 09-04-1950 Today's Date: 02/29/2024   History of Present Illness 73 yo male s/p TK A on 02/28/24. PMH: DM, IBS, COPD, L ankle fx and repair, RCR    PT Comments  Pt is progressing very well this am, pain controlled with mobility; pt is meeting goals and is ready to d/c home  from PT standpoint  with family assist as needed    If plan is discharge home, recommend the following: A little help with walking and/or transfers;A little help with bathing/dressing/bathroom;Help with stairs or ramp for entrance;Assistance with cooking/housework   Can travel by private vehicle        Equipment Recommendations  None recommended by PT    Recommendations for Other Services       Precautions / Restrictions Precautions Precautions: Fall;Knee Recall of Precautions/Restrictions: Intact Precaution/Restrictions Comments: reviewed use of KI for lspeeping of needed, skin checks, donning and doffing Required Braces or Orthoses: Knee Immobilizer - Right Restrictions Weight Bearing Restrictions Per Provider Order: No Other Position/Activity Restrictions: WBAT     Mobility  Bed Mobility Overal bed mobility: Modified Independent                  Transfers Overall transfer level: Needs assistance Equipment used: Rolling walker (2 wheels) Transfers: Sit to/from Stand Sit to Stand: Supervision, Modified independent (Device/Increase time)           General transfer comment: ongoing education for hand placement and RLE Position    Ambulation/Gait Ambulation/Gait assistance: Supervision Gait Distance (Feet): 200 Feet Assistive device: Rolling walker (2 wheels) Gait Pattern/deviations: Step-to pattern, Step-through pattern       General Gait Details: cues for sequence and RW position, progression to step through pattern without incr pain or instability   Stairs Stairs: Yes Stairs assistance:  Contact guard assist, Supervision Stair Management: No rails, Step to pattern, With walker Number of Stairs: 1 General stair comments: cues for safe technique and sequence   Wheelchair Mobility     Tilt Bed    Modified Rankin (Stroke Patients Only)       Balance                                            Communication Communication Communication: No apparent difficulties  Cognition Arousal: Alert Behavior During Therapy: WFL for tasks assessed/performed   PT - Cognitive impairments: No apparent impairments                         Following commands: Intact      Cueing Cueing Techniques: Verbal cues  Exercises Total Joint Exercises Ankle Circles/Pumps: AROM, 10 reps, Both Quad Sets: AROM, Both, 10 reps Heel Slides: AAROM, 10 reps, Right Straight Leg Raises: AROM, Strengthening, Right, 5 reps    General Comments        Pertinent Vitals/Pain Pain Assessment Pain Assessment: 0-10 Pain Score: 4  Pain Location: right knee Pain Descriptors / Indicators: Grimacing, Discomfort, Sore Pain Intervention(s): Limited activity within patient's tolerance, Monitored during session, Premedicated before session, Repositioned    Home Living                          Prior Function            PT Goals (current  goals can now be found in the care plan section) Acute Rehab PT Goals PT Goal Formulation: With patient Time For Goal Achievement: 03/13/24 Potential to Achieve Goals: Good Progress towards PT goals: Progressing toward goals    Frequency    7X/week      PT Plan      Co-evaluation              AM-PAC PT 6 Clicks Mobility   Outcome Measure  Help needed turning from your back to your side while in a flat bed without using bedrails?: A Little Help needed moving from lying on your back to sitting on the side of a flat bed without using bedrails?: None Help needed moving to and from a bed to a chair (including  a wheelchair)?: A Little Help needed standing up from a chair using your arms (e.g., wheelchair or bedside chair)?: None Help needed to walk in hospital room?: A Little Help needed climbing 3-5 steps with a railing? : A Little 6 Click Score: 20    End of Session Equipment Utilized During Treatment: Gait belt Activity Tolerance: Patient tolerated treatment well;No increased pain Patient left: with call bell/phone within reach;in chair;with family/visitor present;with chair alarm set Nurse Communication: Mobility status PT Visit Diagnosis: Other abnormalities of gait and mobility (R26.89)     Time: 9052-8994 PT Time Calculation (min) (ACUTE ONLY): 18 min  Charges:    $Gait Training: 8-22 mins PT General Charges $$ ACUTE PT VISIT: 1 Visit                     Daelyn Pettaway, PT  Acute Rehab Dept [facility]) [phone]  02/29/2024    [facility] 02/29/2024, 10:44 AM

## 2024-03-01 NOTE — Discharge Summary (Signed)
 Patient ID: Vincent Short MRN: [id number] DOB/AGE: August 23, 1950 73 y.o.  Admit date: 02/28/2024 Discharge date: 02/29/2024  Admission Diagnoses:  Principal Problem:   OA (osteoarthritis) of knee Active Problems:   Primary osteoarthritis of right knee   Discharge Diagnoses:  Same  Past Medical History:  Diagnosis Date   Allergic rhinitis    Arthritis    Asthma    Atelectasis    Rt upper lobe   COPD (chronic obstructive pulmonary disease) (HCC)    GERD (gastroesophageal reflux disease)    History of colon polyps 03/14/2009   Tubular adenomatous and Hyperplastic polyps   HTN (hypertension)    Hyperlipidemia    Lung mass    noted on CT scan(12/24/10)    S/P cardiac cath    negative   Tobacco abuse    Treadmill stress test negative for angina pectoris    Dr. Burnard    Surgeries: Procedure(s): ARTHROPLASTY, KNEE, TOTAL on 02/28/2024   Consultants:   Discharged Condition: Improved  Hospital Course: Vincent Short is an 73 y.o. adult who was admitted 02/28/2024 for operative treatment ofOA (osteoarthritis) of knee. Patient has severe unremitting pain that affects sleep, daily activities, and work/hobbies. After pre-op clearance the patient was taken to the operating room on 02/28/2024 and underwent  Procedure(s): ARTHROPLASTY, KNEE, TOTAL.    Patient was given perioperative antibiotics:  Anti-infectives (From admission, onward)    Start     Dose/Rate Route Frequency Ordered Stop   02/28/24 1500  ceFAZolin  (ANCEF ) IVPB 2g/100 mL premix        2 g 200 mL/hr over 30 Minutes Intravenous Every 6 hours 02/28/24 1140 02/29/24 0952   02/28/24 0700  ceFAZolin  (ANCEF ) IVPB 2g/100 mL premix        2 g 200 mL/hr over 30 Minutes Intravenous On call to O.R. 02/28/24 0645 02/28/24 0911        Patient was given sequential compression devices, early ambulation, and chemoprophylaxis to prevent DVT.  Patient benefited maximally from hospital stay and there were no complications.     Recent vital signs: Patient Vitals for the past 24 hrs:  BP Temp Temp src Pulse Resp SpO2  02/29/24 1016 119/77 -- -- -- -- --  02/29/24 1015 119/77 -- -- 80 -- --  02/29/24 1012 119/77 98.2 F (36.8 C) Oral 80 14 95 %     Recent laboratory studies:  Recent Labs    02/29/24 0333  WBC 22.7*  HGB 13.6  HCT 40.5  PLT 246  NA 139  K 4.2  CL 108  CO2 23  BUN 11  CREATININE 0.65  GLUCOSE 135*  CALCIUM  8.7*     Discharge Medications:   Allergies as of 02/29/2024       Reactions   Contrast Media [iodinated Contrast Media] Swelling   Allergic reaction 1987, ok w/ 13 hr prep//a.c.        Medication List     STOP taking these medications    fluticasone  50 MCG/ACT nasal spray Commonly known as: FLONASE    ipratropium 0.06 % nasal spray Commonly known as: ATROVENT    niacin  1000 MG CR tablet Commonly known as: NIASPAN    predniSONE  20 MG tablet Commonly known as: DELTASONE    triamcinolone  cream 0.1 % Commonly known as: KENALOG        TAKE these medications    Accu-Chek Guide test strip Generic drug: glucose blood USE AS DIRECTED DAILY   albuterol  108 (90 Base) MCG/ACT inhaler Commonly known as: VENTOLIN  HFA Inhale  2 puffs into the lungs every 6 (six) hours as needed for wheezing or shortness of breath.   amLODipine  10 MG tablet Commonly known as: NORVASC  Take 1 tablet (10 mg total) by mouth daily.   aspirin 81 MG chewable tablet Chew 1 tablet (81 mg total) by mouth 2 (two) times daily for 21 days. Then take one 81 mg aspirin once a day for three weeks. Then discontinue aspirin.   atorvastatin  80 MG tablet Commonly known as: LIPITOR Take 1 tablet (80 mg total) by mouth daily.   azithromycin  500 MG tablet Commonly known as: ZITHROMAX  Take 500 mg by mouth daily.   Blood Glucose System Pak Kit Please dispense based on patient and insurance preference. Use as directed to monitor FSBS 1x daily. Dx: E11.9.   budesonide-formoterol 160-4.5 MCG/ACT  inhaler Commonly known as: SYMBICORT Inhale 2 puffs into the lungs 2 (two) times daily as needed (shortness of breath).   empagliflozin  25 MG Tabs tablet Commonly known as: Jardiance  Take 1 tablet (25 mg total) by mouth daily before breakfast.   Lancets Misc Please dispense based on patient and insurance preference. Use as directed to monitor FSBS 1x daily. Dx: E11.9.   levocetirizine 5 MG tablet Commonly known as: XYZAL  TAKE 1 TABLET BY MOUTH EVERY DAY IN THE EVENING   losartan  100 MG tablet Commonly known as: COZAAR  Take 1 tablet (100 mg total) by mouth daily.   metFORMIN  500 MG 24 hr tablet Commonly known as: GLUCOPHAGE -XR TAKE 4 TABLETS BY MOUTH DAILY WITH BREAKFAST   methocarbamol 500 MG tablet Commonly known as: ROBAXIN Take 1 tablet (500 mg total) by mouth every 6 (six) hours as needed for muscle spasms.   metoprolol  succinate 25 MG 24 hr tablet Commonly known as: TOPROL -XL Take 1 tablet (25 mg total) by mouth daily.   montelukast  10 MG tablet Commonly known as: SINGULAIR  TAKE 1 TABLET BY MOUTH EVERY DAY   multivitamin tablet Take 1 tablet by mouth daily.   ondansetron  4 MG tablet Commonly known as: ZOFRAN  Take 1 tablet (4 mg total) by mouth every 6 (six) hours as needed for nausea.   oxyCODONE  5 MG immediate release tablet Commonly known as: Oxy IR/ROXICODONE  Take 1-2 tablets (5-10 mg total) by mouth every 6 (six) hours as needed for severe pain (pain score 7-10).   traMADol  50 MG tablet Commonly known as: ULTRAM  Take 1-2 tablets (50-100 mg total) by mouth every 6 (six) hours as needed for moderate pain (pain score 4-6).               Discharge Care Instructions  (From admission, onward)           Start     Ordered   02/29/24 0000  Weight bearing as tolerated        02/29/24 0837   02/29/24 0000  Change dressing       Comments: You may remove the bulky bandage (ACE wrap and gauze) two days after surgery. You will have an adhesive waterproof  bandage underneath. Leave this in place until your first follow-up appointment.   02/29/24 0837            Diagnostic Studies: No results found.  Disposition: Discharge disposition: 01-Home or Self Care       Discharge Instructions     Call MD / Call 911   Complete by: As directed    If you experience chest pain or shortness of breath, CALL 911 and be transported to the hospital emergency  room.  If you develope a fever above 101 F, pus (white drainage) or increased drainage or redness at the wound, or calf pain, call your surgeon's office.   Change dressing   Complete by: As directed    You may remove the bulky bandage (ACE wrap and gauze) two days after surgery. You will have an adhesive waterproof bandage underneath. Leave this in place until your first follow-up appointment.   Constipation Prevention   Complete by: As directed    Drink plenty of fluids.  Prune juice may be helpful.  You may use a stool softener, such as Colace (over the counter) 100 mg twice a day.  Use MiraLax (over the counter) for constipation as needed.   Diet - low sodium heart healthy   Complete by: As directed    Do not put a pillow under the knee. Place it under the heel.   Complete by: As directed    Driving restrictions   Complete by: As directed    No driving for two weeks   Post-operative opioid taper instructions:   Complete by: As directed    POST-OPERATIVE OPIOID TAPER INSTRUCTIONS: It is important to wean off of your opioid medication as soon as possible. If you do not need pain medication after your surgery it is ok to stop day one. Opioids include: Codeine, Hydrocodone (Norco, Vicodin), Oxycodone (Percocet, oxycontin ) and hydromorphone amongst others.  Long term and even short term use of opiods can cause: Increased pain response Dependence Constipation Depression Respiratory depression And more.  Withdrawal symptoms can include Flu like symptoms Nausea, vomiting And  more Techniques to manage these symptoms Hydrate well Eat regular healthy meals Stay active Use relaxation techniques(deep breathing, meditating, yoga) Do Not substitute Alcohol to help with tapering If you have been on opioids for less than two weeks and do not have pain than it is ok to stop all together.  Plan to wean off of opioids This plan should start within one week post op of your joint replacement. Maintain the same interval or time between taking each dose and first decrease the dose.  Cut the total daily intake of opioids by one tablet each day Next start to increase the time between doses. The last dose that should be eliminated is the evening dose.      TED hose   Complete by: As directed    Use stockings (TED hose) for three weeks on both leg(s).  You may remove them at night for sleeping.   Weight bearing as tolerated   Complete by: As directed         Follow-up Information     Melodi Lerner, MD. Go on 03/14/2024.   Specialty: Orthopedic Surgery Why: You are scheduled for a post op appointment on Tuesday 03/14/24 at 1:45pm Contact information: [address] [place] [address] [place] [address] [phone]                  Signed: Roxie Mess 03/01/2024, 8:17 AM

## 2024-03-10 DIAGNOSIS — Z1211 Encounter for screening for malignant neoplasm of colon: Secondary | ICD-10-CM | POA: Diagnosis not present

## 2024-03-13 DIAGNOSIS — Z96651 Presence of right artificial knee joint: Secondary | ICD-10-CM | POA: Diagnosis not present

## 2024-03-13 DIAGNOSIS — R2689 Other abnormalities of gait and mobility: Secondary | ICD-10-CM | POA: Diagnosis not present

## 2024-03-19 ENCOUNTER — Other Ambulatory Visit: Payer: Self-pay | Admitting: Family Medicine

## 2024-03-20 NOTE — Telephone Encounter (Signed)
 Requested Prescriptions  Pending Prescriptions Disp Refills   amLODipine  (NORVASC ) 10 MG tablet [Pharmacy Med Name: AMLODIPINE  BESYLATE 10 MG TAB] 90 tablet 0    Sig: TAKE 1 TABLET BY MOUTH EVERY DAY     Cardiovascular: Calcium  Channel Blockers 2 Passed - 03/20/2024  5:48 PM      Passed - Last BP in normal range    BP Readings from Last 1 Encounters:  02/29/24 119/77         Passed - Last Heart Rate in normal range    Pulse Readings from Last 1 Encounters:  02/29/24 80         Passed - Valid encounter within last 6 months    Recent Outpatient Visits           1 month ago Hemoptysis   [place] [facility] Duanne Butler DASEN, MD   2 months ago Diabetes mellitus without complication [facility])   [place] [facility] Duanne Butler DASEN, MD   1 year ago Prostate cancer screening   [place] [facility] Duanne Butler DASEN, MD   1 year ago COPD exacerbation [facility])   [place] [facility] Duanne Butler DASEN, MD   2 years ago Diabetes mellitus without complication [facility])   [place] [facility] Pickard, Butler DASEN, MD

## 2024-03-23 LAB — COLOGUARD: COLOGUARD: NEGATIVE

## 2024-03-24 ENCOUNTER — Ambulatory Visit: Payer: Self-pay | Admitting: Family Medicine

## 2024-03-31 ENCOUNTER — Other Ambulatory Visit: Payer: Self-pay | Admitting: Family Medicine

## 2024-04-25 ENCOUNTER — Other Ambulatory Visit: Payer: Self-pay | Admitting: Family Medicine

## 2024-04-25 DIAGNOSIS — E7800 Pure hypercholesterolemia, unspecified: Secondary | ICD-10-CM

## 2024-04-25 DIAGNOSIS — E119 Type 2 diabetes mellitus without complications: Secondary | ICD-10-CM

## 2024-04-27 ENCOUNTER — Other Ambulatory Visit: Payer: Self-pay | Admitting: Family Medicine

## 2024-04-28 ENCOUNTER — Telehealth: Payer: Self-pay

## 2024-04-28 NOTE — Telephone Encounter (Signed)
 Copied from CRM #[id number]. Topic: Referral - Question >> Apr 26, 2024  3:15 PM Vincent Short wrote: Reason for CRM: Pt called in advising he is unable to schedule his cancer screening lung exam due to [facility] needing addtl information. They have refaxed paperwork to PCP office yesterday 12/30. Any questions please call center at [phone] opt 2 [phone], or pt on #[phone]

## 2024-05-15 ENCOUNTER — Ambulatory Visit: Payer: Self-pay | Admitting: Family Medicine

## 2024-05-17 ENCOUNTER — Telehealth: Payer: Self-pay

## 2024-05-17 NOTE — Telephone Encounter (Signed)
 Fax received from [facility] for updated smoking history to schedule low dose CT. Fax updated and faxed back to [phone]. Mjp,lpn

## 2024-05-18 NOTE — Progress Notes (Addendum)
 Anesthesia Review:  PCP: Butler pickard  Clearance dated 03/15/24 in Media Tab  Cardiologist : none   PPM/ ICD: Device Orders: Rep Notified:  Chest x-ray : EKG : 02/16/24  Echo : 2019  Stress test: 2012  Cardiac Cath :  2004   Activity level: can do a flight of stairs withoiut difficulty  Sleep Study/ CPAP : has cpap  Fasting Blood Sugar :      / Checks Blood Sugar -- times a day:     DM- type 2 Hgba1c-05/25/24- 6.8    Jardiance - last dose on  06/01/24  Metformin - none am of surgery   Blood Thinner/ Instructions /Last Dose: ASA / Instructions/ Last Dose :   81 mg aspirin  - pt to ask DR aluisio in regards to Aspirin     Preop med hx and instructonis completed via phone call on 05/23/24.  PT aware of lab appt on 05/25/24 at 130pm to pick up instructions, soap and instructions.  PT aware he does not have to be fasting for lab appt.   Right knee 11/25

## 2024-05-18 NOTE — Patient Instructions (Signed)
 SURGICAL WAITING ROOM VISITATION  Patients having surgery or a procedure may have no more than 2 support people in the waiting area - these visitors may rotate.    Children ages 65 and under will not be able to visit patients in [facility] under most circumstances.   Visitors with respiratory illnesses are discouraged from visiting and should remain at home.  If the patient needs to stay at the hospital during part of their recovery, the visitor guidelines for inpatient rooms apply. Pre-op nurse will coordinate an appropriate time for 1 support person to accompany patient in pre-op.  This support person may not rotate.    Please refer to the [facility] website for the visitor guidelines for Inpatients (after your surgery is over and you are in a regular room).       Your procedure is scheduled on:  06/05/2024    Report to [facility] Main Entrance    Report to admitting at   [phone]   Call this number if you have problems the morning of surgery [phone]   Do not eat food :After Midnight.   After Midnight you may have the following liquids until _ 0415_____ AM DAY OF SURGERY  Water Non-Citrus Juices (without pulp, NO RED-Apple, White grape, White cranberry) Black Coffee (NO MILK/CREAM OR CREAMERS, sugar ok)  Clear Tea (NO MILK/CREAM OR CREAMERS, sugar ok) regular and decaf                             Plain Jell-O (NO RED)                                           Fruit ices (not with fruit pulp, NO RED)                                     Popsicles (NO RED)                                                               Sports drinks like Gatorade (NO RED)                   The day of surgery:  Drink ONE (1) Pre-Surgery Clear Ensure or G2 at  0415 AM the morning of surgery. Drink in one sitting. Do not sip.  This drink was given to you during your hospital  pre-op appointment visit. Nothing else to drink after completing the  Pre-Surgery Clear Ensure or  G2.          If you have questions, please contact your surgeons office.      Oral Hygiene is also important to reduce your risk of infection.                                    Remember - BRUSH YOUR TEETH THE MORNING OF SURGERY WITH YOUR REGULAR TOOTHPASTE  DENTURES WILL BE REMOVED PRIOR TO SURGERY PLEASE DO NOT APPLY Poly grip  OR ADHESIVES!!!   Do NOT smoke after Midnight   Stop all vitamins and herbal supplements 7 days before surgery.   Take these medicines the morning of surgery with A SIP OF WATER:  Inhalers as usual and bring, amlodipine , toprol , singulair                Jardiance - last dose on  06/01/24             Metformin - none am of surgery   DO NOT TAKE ANY ORAL DIABETIC MEDICATIONS DAY OF YOUR SURGERY  Bring CPAP mask and tubing day of surgery.                              You may not have any metal on your body including hair pins, jewelry, and body piercing             Do not wear make-up, lotions, powders, perfumes/cologne, or deodorant  Do not wear nail polish including gel and S&S, artificial/acrylic nails, or any other type of covering on natural nails including finger and toenails. If you have artificial nails, gel coating, etc. that needs to be removed by a nail salon please have this removed prior to surgery or surgery may need to be canceled/ delayed if the surgeon/ anesthesia feels like they are unable to be safely monitored.   Do not shave  48 hours prior to surgery.               Men may shave face and neck.   Do not bring valuables to the hospital. [facility] IS NOT             RESPONSIBLE   FOR VALUABLES.   Contacts, glasses, dentures or bridgework may not be worn into surgery.   Bring small overnight bag day of surgery.   DO NOT BRING YOUR HOME MEDICATIONS TO THE HOSPITAL. PHARMACY WILL DISPENSE MEDICATIONS LISTED ON YOUR MEDICATION LIST TO YOU DURING YOUR ADMISSION IN THE HOSPITAL!    Patients discharged on the day of surgery will not be  allowed to drive home.  Someone NEEDS to stay with you for the first 24 hours after anesthesia.   Special Instructions: Bring a copy of your healthcare power of attorney and living will documents the day of surgery if you haven't scanned them before.              Please read over the following fact sheets you were given: IF YOU HAVE QUESTIONS ABOUT YOUR PRE-OP INSTRUCTIONS PLEASE CALL [phone].   If you received a COVID test during your pre-op visit  it is requested that you wear a mask when out in public, stay away from anyone that may not be feeling well and notify your surgeon if you develop symptoms. If you test positive for Covid or have been in contact with anyone that has tested positive in the last 10 days please notify you surgeon.      Pre-operative 4 CHG Bath Instructions   You can play a key role in reducing the risk of infection after surgery. Your skin needs to be as free of germs as possible. You can reduce the number of germs on your skin by washing with CHG (chlorhexidine  gluconate) soap before surgery. CHG is an antiseptic soap that kills germs and continues to kill germs even after washing.   DO NOT use if you have an allergy to chlorhexidine /CHG or antibacterial soaps. If  your skin becomes reddened or irritated, stop using the CHG and notify one of our RNs at [phone].   Please shower with the CHG soap starting 4 days before surgery using the following schedule:     Please keep in mind the following:  DO NOT shave, including legs and underarms, starting the day of your first shower.   You may shave your face at any point before/day of surgery.  Place clean sheets on your bed the day you start using CHG soap. Use a clean washcloth (not used since being washed) for each shower. DO NOT sleep with pets once you start using the CHG.   CHG Shower Instructions:  If you choose to wash your hair and private area, wash first with your normal shampoo/soap.  After you use  shampoo/soap, rinse your hair and body thoroughly to remove shampoo/soap residue.  Turn the water OFF and apply about 3 tablespoons (45 ml) of CHG soap to a CLEAN washcloth.  Apply CHG soap ONLY FROM YOUR NECK DOWN TO YOUR TOES (washing for 3-5 minutes)  DO NOT use CHG soap on face, private areas, open wounds, or sores.  Pay special attention to the area where your surgery is being performed.  If you are having back surgery, having someone wash your back for you may be helpful. Wait 2 minutes after CHG soap is applied, then you may rinse off the CHG soap.  Pat dry with a clean towel  Put on clean clothes/pajamas   If you choose to wear lotion, please use ONLY the CHG-compatible lotions on the back of this paper.     Additional instructions for the day of surgery: DO NOT APPLY any lotions, deodorants, cologne, or perfumes.   Put on clean/comfortable clothes.  Brush your teeth.  Ask your nurse before applying any prescription medications to the skin.      CHG Compatible Lotions   Aveeno Moisturizing lotion  Cetaphil Moisturizing Cream  Cetaphil Moisturizing Lotion  Clairol Herbal Essence Moisturizing Lotion, Dry Skin  Clairol Herbal Essence Moisturizing Lotion, Extra Dry Skin  Clairol Herbal Essence Moisturizing Lotion, Normal Skin  Curel Age Defying Therapeutic Moisturizing Lotion with Alpha Hydroxy  Curel Extreme Care Body Lotion  Curel Soothing Hands Moisturizing Hand Lotion  Curel Therapeutic Moisturizing Cream, Fragrance-Free  Curel Therapeutic Moisturizing Lotion, Fragrance-Free  Curel Therapeutic Moisturizing Lotion, Original Formula  Eucerin Daily Replenishing Lotion  Eucerin Dry Skin Therapy Plus Alpha Hydroxy Crme  Eucerin Dry Skin Therapy Plus Alpha Hydroxy Lotion  Eucerin Original Crme  Eucerin Original Lotion  Eucerin Plus Crme Eucerin Plus Lotion  Eucerin TriLipid Replenishing Lotion  Keri Anti-Bacterial Hand Lotion  Keri Deep Conditioning Original Lotion Dry  Skin Formula Softly Scented  Keri Deep Conditioning Original Lotion, Fragrance Free Sensitive Skin Formula  Keri Lotion Fast Absorbing Fragrance Free Sensitive Skin Formula  Keri Lotion Fast Absorbing Softly Scented Dry Skin Formula  Keri Original Lotion  Keri Skin Renewal Lotion Keri Silky Smooth Lotion  Keri Silky Smooth Sensitive Skin Lotion  Nivea Body Creamy Conditioning Oil  Nivea Body Extra Enriched Teacher, Adult Education Moisturizing Lotion Nivea Crme  Nivea Skin Firming Lotion  NutraDerm 30 Skin Lotion  NutraDerm Skin Lotion  NutraDerm Therapeutic Skin Cream  NutraDerm Therapeutic Skin Lotion  ProShield Protective Hand Cream  Provon moisturizing lotion

## 2024-05-23 ENCOUNTER — Encounter (HOSPITAL_COMMUNITY): Payer: Self-pay

## 2024-05-23 ENCOUNTER — Encounter (HOSPITAL_COMMUNITY)
Admission: RE | Admit: 2024-05-23 | Discharge: 2024-05-23 | Disposition: A | Discharge status: Home / Self Care | Source: Ambulatory Visit | Attending: Orthopedic Surgery | Admitting: Orthopedic Surgery

## 2024-05-23 ENCOUNTER — Other Ambulatory Visit: Payer: Self-pay

## 2024-05-23 DIAGNOSIS — Z01818 Encounter for other preprocedural examination: Secondary | ICD-10-CM | POA: Insufficient documentation

## 2024-05-23 HISTORY — DX: Type 2 diabetes mellitus without complications: E11.9

## 2024-05-23 HISTORY — DX: Sleep apnea, unspecified: G47.30

## 2024-05-25 ENCOUNTER — Encounter (HOSPITAL_COMMUNITY)
Admission: RE | Admit: 2024-05-25 | Discharge: 2024-05-25 | Disposition: A | Discharge status: Home / Self Care | Source: Ambulatory Visit | Attending: Orthopedic Surgery | Admitting: Orthopedic Surgery

## 2024-05-25 DIAGNOSIS — E119 Type 2 diabetes mellitus without complications: Secondary | ICD-10-CM | POA: Insufficient documentation

## 2024-05-25 DIAGNOSIS — Z01812 Encounter for preprocedural laboratory examination: Secondary | ICD-10-CM | POA: Insufficient documentation

## 2024-05-25 DIAGNOSIS — Z01818 Encounter for other preprocedural examination: Secondary | ICD-10-CM

## 2024-05-25 LAB — HEMOGLOBIN A1C
Hgb A1c MFr Bld: 6.8 % — ABNORMAL HIGH (ref 4.8–5.6)
Mean Plasma Glucose: 148.46 mg/dL

## 2024-05-25 LAB — CBC
HCT: 47.9 % (ref 39.0–52.0)
Hemoglobin: 16.2 g/dL (ref 13.0–17.0)
MCH: 32.1 pg (ref 26.0–34.0)
MCHC: 33.8 g/dL (ref 30.0–36.0)
MCV: 95 fL (ref 80.0–100.0)
Platelets: 282 10*3/uL (ref 150–400)
RBC: 5.04 MIL/uL (ref 4.22–5.81)
RDW: 12.8 % (ref 11.5–15.5)
WBC: 11.6 10*3/uL — ABNORMAL HIGH (ref 4.0–10.5)
nRBC: 0 % (ref 0.0–0.2)

## 2024-05-25 LAB — BASIC METABOLIC PANEL WITH GFR
Anion gap: 10 (ref 5–15)
BUN: 15 mg/dL (ref 8–23)
CO2: 25 mmol/L (ref 22–32)
Calcium: 9.6 mg/dL (ref 8.9–10.3)
Chloride: 106 mmol/L (ref 98–111)
Creatinine, Ser: 0.79 mg/dL (ref 0.61–1.24)
GFR, Estimated: >60 mL/min
Glucose, Bld: 113 mg/dL — ABNORMAL HIGH (ref 70–99)
Potassium: 4.4 mmol/L (ref 3.5–5.1)
Sodium: 141 mmol/L (ref 135–145)

## 2024-05-25 LAB — SURGICAL PCR SCREEN
MRSA, PCR: NEGATIVE
Staphylococcus aureus: NEGATIVE

## 2024-05-25 LAB — GLUCOSE, CAPILLARY: Glucose-Capillary: 113 mg/dL — ABNORMAL HIGH (ref 70–99)

## 2024-05-31 ENCOUNTER — Other Ambulatory Visit: Payer: Self-pay | Admitting: Family Medicine

## 2024-05-31 NOTE — H&P (Signed)
 TOTAL KNEE ADMISSION H&P  Patient is being admitted for left total knee arthroplasty.  Subjective:  Chief Complaint: Left knee pain.  HPI: Vincent Short, 74 y.o. male has a history of pain and functional disability in the left knee due to arthritis and has failed non-surgical conservative treatments for greater than 12 weeks to include NSAID's and/or analgesics, corticosteriod injections, and activity modification. Onset of symptoms was gradual, starting several years ago with gradually worsening course since that time. The patient noted no past surgery on the left knee.  Patient currently rates pain in the left knee at 8 out of 10 with activity. Patient has night pain, worsening of pain with activity and weight bearing, pain with passive range of motion, and crepitus. Patient has evidence of bone-on-bone formation in the medial compartment by imaging studies. There is no active infection.  Patient Active Problem List   Diagnosis Date Noted   Primary osteoarthritis of right knee 02/28/2024   OA (osteoarthritis) of knee 10/22/2023   Arthralgia of left knee 10/21/2023   Right shoulder pain 08/05/2021   Environmental allergies 05/08/2021   Diabetes mellitus without complication (HCC) 02/14/2021   CAD- mild disease at cath 2004 11/06/2013   Acute upper respiratory infection 09/09/2013   Postnasal drip 09/09/2013   Anal fistula 07/25/2012   Hyperlipidemia    Seroma, postoperative 04/06/2011   COPD (chronic obstructive pulmonary disease) (HCC)    Lung mass    Atelectasis    IRRITABLE BOWEL SYNDROME 03/07/2009   HEMORRHAGE OF RECTUM AND ANUS 03/07/2009   OBSTRUCTIVE SLEEP APNEA 08/08/2008   TOBACCO ABUSE 08/06/2008   HYPERCHOLESTEROLEMIA 07/12/2007   HYPERTENSION 07/12/2007   ALLERGIC RHINITIS 07/12/2007   ASTHMA 07/12/2007   DUODENITIS, WITHOUT HEMORRHAGE 07/12/2007    Past Medical History:  Diagnosis Date   Allergic rhinitis    Arthritis    Asthma    Atelectasis    Rt upper  lobe   COPD (chronic obstructive pulmonary disease) (HCC)    Diabetes mellitus without complication (HCC)    GERD (gastroesophageal reflux disease)    History of colon polyps 03/14/2009   Tubular adenomatous and Hyperplastic polyps   HTN (hypertension)    Hyperlipidemia    Lung mass    noted on CT scan(12/24/10)    S/P cardiac cath    negative   Sleep apnea    has cpap   Tobacco abuse    Treadmill stress test negative for angina pectoris    Dr. Burnard    Past Surgical History:  Procedure Laterality Date   Arthroscopy left wrist with radial styloidectomy.  07/06/2000   Dr Murrell   Bronchoscopy with airway inspection, brush cytology,  11/22/2006   Dr Shellia   CARDIAC CATHETERIZATION  07/05/2002   Dr Burnard SAXON LVF, SMOOTH 10% TO 20% OSTIAL AND 10%-20% LUMINAL IRREGULARITY OF THE PROXIMAL LAD, SMOOTH 30%-40% PROXIMAL RCA,WHICH IMPROVED FOLLOWING INTRACORONARY NTG ADMINISTRATION SUGGESTING CORONARY VASOSPASM. NO AORTOILIAC DISEASE.   DOPPLER ECHOCARDIOGRAPHY N/A 06/22/08   LV SIZE IS NORMAL. LV FUNCTION IS NORMAL. EF=>55%. THERE IS BORDERLINE CONCENTRIC LVH.   FRACTURE SURGERY     Left wrist scaphoid fx   FRACTURE SURGERY     Left ankle surgery   Left ankle surgery  11/11/2005   Dr Jane   LUNG SURGERY  01/21/11   Dr. Lucas   FOB, R THORACOTOMY WITH RIGHT UPPER LOBECTOMY   LUNG SURGERY  01/21/11   FOB, R THORACOTOMY WITH RIGHT UPPER LOBECTOMY   NASAL SINUS SURGERY  1990  NUCLEAR STRESS TEST N/A 09/09/10   NORMAL PATTERN OF PERFUSION IN ALL REGIONS. EF IS 63%. NO WALL MOTION ABNORMALITIES.   ROTATOR CUFF REPAIR  pending   Dr. Jane   TONSILLECTOMY     TOTAL KNEE ARTHROPLASTY Right 02/28/2024   Procedure: ARTHROPLASTY, KNEE, TOTAL;  Surgeon: Melodi Lerner, MD;  Location: [facility];  Service: Orthopedics;  Laterality: Right;    Prior to Admission medications  Medication Sig Start Date End Date Taking? Authorizing Provider  albuterol  (VENTOLIN  HFA) 108 (90 Base) MCG/ACT inhaler  Inhale 2 puffs into the lungs every 6 (six) hours as needed for wheezing or shortness of breath. 08/19/23  Yes Duanne Butler DASEN, MD  amLODipine  (NORVASC ) 10 MG tablet TAKE 1 TABLET BY MOUTH EVERY DAY 03/20/24  Yes Duanne Butler DASEN, MD  aspirin  EC 81 MG tablet Take 81 mg by mouth daily. Swallow whole.   Yes [provider]  atorvastatin  (LIPITOR) 80 MG tablet TAKE 1 TABLET BY MOUTH EVERY DAY 04/03/24  Yes Duanne Butler DASEN, MD  budesonide-formoterol [facility]) 160-4.5 MCG/ACT inhaler Inhale 2 puffs into the lungs 2 (two) times daily as needed (shortness of breath).   Yes [provider]  empagliflozin  (JARDIANCE ) 25 MG TABS tablet Take 1 tablet (25 mg total) by mouth daily before breakfast. 01/13/24  Yes Duanne Butler DASEN, MD  losartan  (COZAAR ) 100 MG tablet Take 1 tablet (100 mg total) by mouth daily. 08/02/23  Yes Duanne Butler DASEN, MD  metFORMIN  (GLUCOPHAGE -XR) 500 MG 24 hr tablet TAKE 4 TABLETS BY MOUTH DAILY WITH BREAKFAST. 04/25/24  Yes Duanne Butler DASEN, MD  metoprolol  succinate (TOPROL -XL) 25 MG 24 hr tablet TAKE 1 TABLET (25 MG TOTAL) BY MOUTH DAILY. 04/28/24  Yes Duanne Butler DASEN, MD  montelukast  (SINGULAIR ) 10 MG tablet TAKE 1 TABLET BY MOUTH EVERY DAY 01/31/24  Yes Duanne Butler DASEN, MD  Multiple Vitamin (MULTIVITAMIN) tablet Take 1 tablet by mouth daily.   Yes [provider]  levocetirizine (XYZAL ) 5 MG tablet TAKE 1 TABLET BY MOUTH EVERY DAY IN THE EVENING 05/31/24   Duanne Butler DASEN, MD    Allergies[1]  Social History   Socioeconomic History   Marital status: Married    Spouse name: Not on file   Number of children: Not on file   Years of education: Not on file   Highest education level: Not on file  Occupational History   Occupation: heating and AC    Employer: [employer]  Tobacco Use   Smoking status: Former    Current packs/day: 0.00    Types: Cigarettes    Quit date: 02/26/2020    Years since quitting: 4.2   Smokeless tobacco: Never   Vaping Use   Vaping status: Never Used  Substance and Sexual Activity   Alcohol use: Yes    Comment: occas   Drug use: No   Sexual activity: Not on file  Other Topics Concern   Not on file  Social History Narrative    He is married.  He works in production assistant, radio.  He smoked 1 pack of cigarettes a day for the last 35     years.  He has occasional alcohol use.    Social Drivers of Health   Tobacco Use: Medium Risk (05/23/2024)   Patient History    Smoking Tobacco Use: Former    Smokeless Tobacco Use: Never    Passive Exposure: Not on file  Financial Resource Strain: Low Risk (01/05/2024)  Overall Financial Resource Strain (CARDIA)    Difficulty of Paying Living Expenses: Not hard at all  Food Insecurity: No Food Insecurity (02/28/2024)   Epic    Worried About Programme Researcher, Broadcasting/film/video in the Last Year: Never true    Ran Out of Food in the Last Year: Never true  Transportation Needs: No Transportation Needs (02/28/2024)   Epic    Lack of Transportation (Medical): No    Lack of Transportation (Non-Medical): No  Physical Activity: Insufficiently Active (01/05/2024)   Exercise Vital Sign    Days of Exercise per Week: 3 days    Minutes of Exercise per Session: 30 min  Stress: No Stress Concern Present (01/05/2024)   [employer] of Occupational Health - Occupational Stress Questionnaire    Feeling of Stress: Not at all  Social Connections: Socially Integrated (02/28/2024)   Social Connection and Isolation Panel    Frequency of Communication with Friends and Family: More than three times a week    Frequency of Social Gatherings with Friends and Family: Never    Attends Religious Services: More than 4 times per year    Active Member of Clubs or Organizations: Yes    Attends Banker Meetings: More than 4 times per year    Marital Status: Married  Catering Manager Violence: Not At Risk (02/28/2024)   Epic    Fear of Current or Ex-Partner: No     Emotionally Abused: No    Physically Abused: No    Sexually Abused: No  Depression (PHQ2-9): Low Risk (02/17/2024)   Depression (PHQ2-9)    PHQ-2 Score: 0  Alcohol Screen: Low Risk (01/05/2024)   Alcohol Screen    Last Alcohol Screening Score (AUDIT): 0  Housing: Low Risk (02/28/2024)   Epic    Unable to Pay for Housing in the Last Year: No    Number of Times Moved in the Last Year: 0    Homeless in the Last Year: No  Utilities: Not At Risk (02/28/2024)   Epic    Threatened with loss of utilities: No  Health Literacy: Adequate Health Literacy (01/05/2024)   B1300 Health Literacy    Frequency of need for help with medical instructions: Never    Tobacco Use: Medium Risk (05/23/2024)   Patient History    Smoking Tobacco Use: Former    Smokeless Tobacco Use: Never    Passive Exposure: Not on file   Social History   Substance and Sexual Activity  Alcohol Use Yes   Comment: occas    Family History  Problem Relation Age of Onset   Coronary artery disease Father    Allergic rhinitis Father    Heart disease Father    Coronary artery disease Mother    Allergic rhinitis Mother    Heart disease Mother    Kidney disease Mother    Stroke Mother     ROS  Objective:  Physical Exam: Well nourished and well developed.  General: Alert and oriented x3, cooperative and pleasant, no acute distress.  Head: normocephalic, atraumatic, neck supple.  Eyes: EOMI.  Musculoskeletal:  Left knee: No effusion. Range of motion 0=135 with crepitus on range of motion. Slight tenderness medially. No lateral tenderness or instability noted.  Calves soft and nontender. Motor function intact in LE. Strength 5/5 LE bilaterally. Neuro: Distal pulses 2+. Sensation to light touch intact in LE.  Imaging Review Plain radiographs demonstrate severe degenerative joint disease of the left knee. The overall alignment is neutral. The bone quality  appears to be adequate for age and reported activity  level.  Assessment/Plan:  End stage arthritis, left knee   The patient history, physical examination, clinical judgment of the provider and imaging studies are consistent with end stage degenerative joint disease of the left knee and total knee arthroplasty is deemed medically necessary. The treatment options including medical management, injection therapy arthroscopy and arthroplasty were discussed at length. The risks and benefits of total knee arthroplasty were presented and reviewed. The risks due to aseptic loosening, infection, stiffness, patella tracking problems, thromboembolic complications and other imponderables were discussed. The patient acknowledged the explanation, agreed to proceed with the plan and consent was signed. Patient is being admitted for inpatient treatment for surgery, pain control, PT, OT, prophylactic antibiotics, VTE prophylaxis, progressive ambulation and ADLs and discharge planning. The patient is planning to be discharged home.   Patient's anticipated LOS is less than 2 midnights, meeting these requirements: - Lives within 1 hour of care - Has a competent adult at home to recover with post-op recover - NO history of  - Chronic pain requiring opioids  - Diabetes  - Coronary Artery Disease  - Heart failure  - Heart attack  - Stroke  - DVT/VTE  - Cardiac arrhythmia  - Respiratory Failure/COPD  - Renal failure  - Anemia  - Advanced Liver disease  Therapy Plans: Outpatient therapy at [facility] Disposition: Home with wife Planned DVT Prophylaxis: Aspirin  81 mg BID DME Needed: None PCP: Butler Burr, MD (clearance from 02/2024) TXA: IV Allergies: Iodine  Metal Allergy: None Anesthesia Concerns: None BMI: 26.4 Last HgbA1c: 6.8% Pain Regimen: Oxycodone , tramadol  [facility]  - Patient was instructed on what medications to stop prior to surgery. - Follow-up visit in 2 weeks with Dr. Melodi - Begin physical therapy following surgery -  Pre-operative lab work as pre-surgical testing - Prescriptions will be provided in hospital at time of discharge  Roxie Mess, PA-C Orthopedic Surgery [facility] [place]      [1]  Allergies Allergen Reactions   Contrast Media [Iodinated Contrast Media] Swelling    Allergic reaction 1987, ok w/ 13 hr prep//a.c.

## 2024-05-31 NOTE — Anesthesia Preprocedure Evaluation (Signed)
 "                                  Anesthesia Evaluation    Airway        Dental   Pulmonary asthma , sleep apnea , COPD,  COPD inhaler, former smoker Lung mass          Cardiovascular hypertension (amlodipine , losartan , metoprolol ), Pt. on medications + CAD    HLD  TTE 12/14/2017: Study Conclusions   - Left ventricle: The cavity size was normal. There was mild    concentric hypertrophy. Systolic function was normal. The    estimated ejection fraction was in the range of 55% to 60%. Wall    motion was normal; there were no regional wall motion    abnormalities. Doppler parameters are consistent with abnormal    left ventricular relaxation (grade 1 diastolic dysfunction).  - Aortic valve: Noncoronary cusp mobility was mildly restricted.  - Left atrium: The atrium was mildly dilated.  - Atrial septum: No defect or patent foramen ovale was identified.     Neuro/Psych    GI/Hepatic ,GERD  ,,  Endo/Other  diabetes (Hgb A1c 6.8), Well Controlled, Type 2, Oral Hypoglycemic Agents    Renal/GU      Musculoskeletal  (+) Arthritis , Osteoarthritis,    Abdominal   Peds  Hematology Lab Results      Component                Value               Date                      WBC                      11.6 (H)            05/25/2024                HGB                      16.2                05/25/2024                HCT                      47.9                05/25/2024                MCV                      95.0                05/25/2024                PLT                      282                 05/25/2024              Anesthesia Other Findings   Reproductive/Obstetrics  Anesthesia Physical Anesthesia Plan  ASA: 3  Anesthesia Plan: MAC and Spinal   Post-op Pain Management: Regional block* and Ofirmev  IV (intra-op)*   Induction: Intravenous  PONV Risk Score and Plan: 2 and Ondansetron , Dexamethasone ,  Propofol  infusion and Treatment may vary due to age or medical condition  Airway Management Planned: Natural Airway and Simple Face Mask  Additional Equipment:   Intra-op Plan:   Post-operative Plan:   Informed Consent:   Plan Discussed with:   Anesthesia Plan Comments:          Anesthesia Quick Evaluation  "

## 2024-06-05 ENCOUNTER — Ambulatory Visit (HOSPITAL_COMMUNITY): Admission: RE | Admit: 2024-06-05 | Source: Home / Self Care | Admitting: Orthopedic Surgery

## 2024-06-05 ENCOUNTER — Encounter (HOSPITAL_COMMUNITY): Admission: RE | Payer: Self-pay | Source: Home / Self Care

## 2024-06-05 ENCOUNTER — Encounter (HOSPITAL_COMMUNITY): Payer: Self-pay | Admitting: Anesthesiology

## 2024-06-05 ENCOUNTER — Encounter (HOSPITAL_COMMUNITY): Payer: Self-pay | Admitting: Medical

## 2024-06-05 SURGERY — ARTHROPLASTY, KNEE, TOTAL
Anesthesia: Choice | Site: Knee | Laterality: Left

## 2025-01-10 ENCOUNTER — Ambulatory Visit
